# Patient Record
Sex: Female | Born: 1965
Health system: Southern US, Community
[De-identification: ages and names within clinical notes are randomized; demographics above are authoritative.]

## PROBLEM LIST (undated history)

## (undated) DIAGNOSIS — C801 Malignant (primary) neoplasm, unspecified: Secondary | ICD-10-CM

## (undated) DIAGNOSIS — F32A Depression, unspecified: Secondary | ICD-10-CM

## (undated) DIAGNOSIS — F419 Anxiety disorder, unspecified: Secondary | ICD-10-CM

## (undated) DIAGNOSIS — F329 Major depressive disorder, single episode, unspecified: Secondary | ICD-10-CM

## (undated) DIAGNOSIS — Z1379 Encounter for other screening for genetic and chromosomal anomalies: Secondary | ICD-10-CM

## (undated) HISTORY — PX: CERVICAL BIOPSY  W/ LOOP ELECTRODE EXCISION: SUR135

## (undated) HISTORY — PX: DENTAL SURGERY: SHX609

## (undated) HISTORY — PX: ABDOMINAL HYSTERECTOMY: SHX81

## (undated) HISTORY — DX: Encounter for other screening for genetic and chromosomal anomalies: Z13.79

---

## 2005-04-16 ENCOUNTER — Inpatient Hospital Stay (HOSPITAL_COMMUNITY): Admission: EM | Admit: 2005-04-16 | Discharge: 2005-04-20 | Payer: Self-pay | Admitting: *Deleted

## 2005-05-01 ENCOUNTER — Emergency Department (HOSPITAL_COMMUNITY): Admission: EM | Admit: 2005-05-01 | Discharge: 2005-05-01 | Payer: Self-pay | Admitting: Emergency Medicine

## 2015-04-05 ENCOUNTER — Emergency Department (HOSPITAL_COMMUNITY): Payer: Self-pay

## 2015-04-05 ENCOUNTER — Inpatient Hospital Stay (HOSPITAL_COMMUNITY)
Admission: EM | Admit: 2015-04-05 | Discharge: 2015-04-09 | DRG: 742 | Disposition: A | Discharge status: Home / Self Care | Payer: Self-pay | Attending: Obstetrics and Gynecology | Admitting: Obstetrics and Gynecology

## 2015-04-05 ENCOUNTER — Encounter (HOSPITAL_COMMUNITY): Payer: Self-pay | Admitting: Emergency Medicine

## 2015-04-05 DIAGNOSIS — B9789 Other viral agents as the cause of diseases classified elsewhere: Secondary | ICD-10-CM | POA: Diagnosis present

## 2015-04-05 DIAGNOSIS — N7093 Salpingitis and oophoritis, unspecified: Principal | ICD-10-CM | POA: Diagnosis present

## 2015-04-05 DIAGNOSIS — E871 Hypo-osmolality and hyponatremia: Secondary | ICD-10-CM | POA: Diagnosis present

## 2015-04-05 DIAGNOSIS — F17210 Nicotine dependence, cigarettes, uncomplicated: Secondary | ICD-10-CM | POA: Diagnosis present

## 2015-04-05 DIAGNOSIS — R1900 Intra-abdominal and pelvic swelling, mass and lump, unspecified site: Secondary | ICD-10-CM | POA: Diagnosis present

## 2015-04-05 DIAGNOSIS — F419 Anxiety disorder, unspecified: Secondary | ICD-10-CM | POA: Diagnosis present

## 2015-04-05 DIAGNOSIS — Z806 Family history of leukemia: Secondary | ICD-10-CM

## 2015-04-05 DIAGNOSIS — F329 Major depressive disorder, single episode, unspecified: Secondary | ICD-10-CM | POA: Diagnosis present

## 2015-04-05 DIAGNOSIS — R740 Nonspecific elevation of levels of transaminase and lactic acid dehydrogenase [LDH]: Secondary | ICD-10-CM | POA: Diagnosis present

## 2015-04-05 DIAGNOSIS — D259 Leiomyoma of uterus, unspecified: Secondary | ICD-10-CM | POA: Diagnosis present

## 2015-04-05 DIAGNOSIS — N7011 Chronic salpingitis: Secondary | ICD-10-CM | POA: Diagnosis present

## 2015-04-05 DIAGNOSIS — N1330 Unspecified hydronephrosis: Secondary | ICD-10-CM | POA: Diagnosis present

## 2015-04-05 HISTORY — DX: Depression, unspecified: F32.A

## 2015-04-05 HISTORY — DX: Anxiety disorder, unspecified: F41.9

## 2015-04-05 HISTORY — DX: Major depressive disorder, single episode, unspecified: F32.9

## 2015-04-05 LAB — COMPREHENSIVE METABOLIC PANEL
ALK PHOS: 139 U/L — AB (ref 38–126)
ALT: 61 U/L — ABNORMAL HIGH (ref 14–54)
ANION GAP: 13 (ref 5–15)
AST: 54 U/L — ABNORMAL HIGH (ref 15–41)
Albumin: 3 g/dL — ABNORMAL LOW (ref 3.5–5.0)
BUN: 9 mg/dL (ref 6–20)
CO2: 25 mmol/L (ref 22–32)
Calcium: 9.2 mg/dL (ref 8.9–10.3)
Chloride: 94 mmol/L — ABNORMAL LOW (ref 101–111)
Creatinine, Ser: 0.8 mg/dL (ref 0.44–1.00)
GFR calc Af Amer: >60 mL/min (ref >60)
GFR calc non Af Amer: >60 mL/min (ref >60)
GLUCOSE: 154 mg/dL — AB (ref 65–99)
Potassium: 5 mmol/L (ref 3.5–5.1)
Sodium: 132 mmol/L — ABNORMAL LOW (ref 135–145)
Total Bilirubin: 0.7 mg/dL (ref 0.3–1.2)
Total Protein: 7.8 g/dL (ref 6.5–8.1)

## 2015-04-05 LAB — URINE MICROSCOPIC-ADD ON

## 2015-04-05 LAB — URINALYSIS, ROUTINE W REFLEX MICROSCOPIC
Bilirubin Urine: NEGATIVE
Glucose, UA: NEGATIVE mg/dL
Ketones, ur: 40 mg/dL — AB
LEUKOCYTES UA: NEGATIVE
Nitrite: NEGATIVE
Protein, ur: NEGATIVE mg/dL
Specific Gravity, Urine: 1.011 (ref 1.005–1.030)
Urobilinogen, UA: 1 mg/dL (ref 0.0–1.0)
pH: 5.5 (ref 5.0–8.0)

## 2015-04-05 LAB — CBC
HEMATOCRIT: 42.7 % (ref 36.0–46.0)
HEMOGLOBIN: 15.1 g/dL — AB (ref 12.0–15.0)
MCH: 32 pg (ref 26.0–34.0)
MCHC: 35.4 g/dL (ref 30.0–36.0)
MCV: 90.5 fL (ref 78.0–100.0)
Platelets: 284 10*3/uL (ref 150–400)
RBC: 4.72 MIL/uL (ref 3.87–5.11)
RDW: 12.9 % (ref 11.5–15.5)
WBC: 12.9 10*3/uL — ABNORMAL HIGH (ref 4.0–10.5)

## 2015-04-05 LAB — WET PREP, GENITAL
Clue Cells Wet Prep HPF POC: NONE SEEN
Trich, Wet Prep: NONE SEEN
YEAST WET PREP: NONE SEEN

## 2015-04-05 LAB — LIPASE, BLOOD: Lipase: 26 U/L (ref 11–51)

## 2015-04-05 LAB — POC URINE PREG, ED: Preg Test, Ur: NEGATIVE

## 2015-04-05 LAB — I-STAT CG4 LACTIC ACID, ED: Lactic Acid, Venous: 0.67 mmol/L (ref 0.5–2.0)

## 2015-04-05 MED ORDER — SODIUM CHLORIDE 0.9 % IV BOLUS (SEPSIS)
1000.0000 mL | Freq: Once | INTRAVENOUS | Status: AC
  Administered 2015-04-05: 1000 mL via INTRAVENOUS

## 2015-04-05 MED ORDER — DEXTROSE 5 % IV SOLN
2.0000 g | Freq: Two times a day (BID) | INTRAVENOUS | Status: DC
  Administered 2015-04-05 – 2015-04-09 (×8): 2 g via INTRAVENOUS
  Filled 2015-04-05 (×10): qty 2

## 2015-04-05 MED ORDER — DEXTROSE 5 % IV SOLN
100.0000 mg | Freq: Two times a day (BID) | INTRAVENOUS | Status: DC
  Administered 2015-04-05 – 2015-04-09 (×8): 100 mg via INTRAVENOUS
  Filled 2015-04-05 (×10): qty 100

## 2015-04-05 MED ORDER — MORPHINE SULFATE (PF) 4 MG/ML IV SOLN
4.0000 mg | Freq: Once | INTRAVENOUS | Status: AC
  Administered 2015-04-05: 4 mg via INTRAVENOUS
  Filled 2015-04-05: qty 1

## 2015-04-05 MED ORDER — IOHEXOL 300 MG/ML  SOLN
80.0000 mL | Freq: Once | INTRAMUSCULAR | Status: AC | PRN
  Administered 2015-04-05: 80 mL via INTRAVENOUS

## 2015-04-05 MED ORDER — ACETAMINOPHEN 325 MG PO TABS
650.0000 mg | ORAL_TABLET | Freq: Once | ORAL | Status: AC
  Administered 2015-04-05: 650 mg via ORAL
  Filled 2015-04-05: qty 2

## 2015-04-05 NOTE — ED Notes (Signed)
Patient states has "severe abdominal pain and constipation".  Patient states she did have a bowel movement this morning.   Patient states she did have stomach bug last Thursday and went to [facility] urgent care.   Patient states has been taking ciprofloaxacin for blood in urine last week.  Patient denies blood in urine or stool.  Denies N//V.   Patient tearful at triage and states "I haven't been taking care of myself and have been drinking red bull and smoking cigarettes, but not eating very much.

## 2015-04-05 NOTE — ED Notes (Signed)
Patient transported to CT via stretcher.

## 2015-04-05 NOTE — ED Provider Notes (Signed)
CSN: [id number]     Arrival date & time 04/05/15  1416 History   First MD Initiated Contact with Patient 04/05/15 1751     Chief Complaint  Patient presents with  . Abdominal Pain     (Consider location/radiation/quality/duration/timing/severity/associated sxs/prior Treatment) HPI Comments: Patient reporting pelvic/lower abdominal pain originally onset about 2 weeks ago.  Patient has been seen at an urgent care, noted to have hematuria, and started on ciprofloxacin.  No nausea or vomiting. No vaginal discharge. Continues to have severe lower abdominal/pelvic pain.  Patient is a 49 y.o. female presenting with abdominal pain. The history is provided by the patient. No language interpreter was used.  Abdominal Pain Pain location:  Suprapubic Pain quality: aching and gnawing   Pain severity:  Moderate Onset quality:  Gradual Duration:  2 weeks Progression:  Worsening Chronicity:  New   Past Medical History  Diagnosis Date  . Anxiety and depression    History reviewed. No pertinent past surgical history. No family history on file. Social History  Substance Use Topics  . Smoking status: Former Smoker    Quit date: 04/01/2015  . Smokeless tobacco: None  . Alcohol Use: No   OB History    No data available     Review of Systems  Gastrointestinal: Positive for abdominal pain.  Genitourinary: Positive for pelvic pain.  All other systems reviewed and are negative.     Allergies  Review of patient's allergies indicates no known allergies.  Home Medications   Prior to Admission medications   Medication Sig Start Date End Date Taking? Authorizing Provider  ALPRAZolam Duanne Moron) 0.5 MG tablet Take 0.5 mg by mouth at bedtime as needed for anxiety.   Yes Historical Provider, MD  ciprofloxacin (CIPRO) 500 MG tablet Take 500 mg by mouth every 12 (twelve) hours.   Yes Historical Provider, MD   BP 93/54 mmHg  Pulse 75  Temp(Src) 98.4 F (36.9 C) (Oral)  Resp 22  Ht 5\' 5"  (1.651  m)  Wt 110 lb (49.896 kg)  BMI 18.31 kg/m2  SpO2 98% Physical Exam  Constitutional: She is oriented to person, place, and time. She appears well-developed and well-nourished.  HENT:  Head: Normocephalic.  Eyes: Pupils are equal, round, and reactive to light.  Neck: Neck supple.  Cardiovascular: Normal rate and regular rhythm.   Pulmonary/Chest: Effort normal and breath sounds normal.  Abdominal: She exhibits distension and mass. There is tenderness.    Genitourinary: There is lesion on the right labia. Right adnexum displays fullness. Left adnexum displays fullness. Vaginal discharge found.  Musculoskeletal: She exhibits no edema or tenderness.  Neurological: She is alert and oriented to person, place, and time.  Skin: Skin is warm and dry.  Psychiatric: She has a normal mood and affect.  Nursing note and vitals reviewed.   ED Course  Procedures (including critical care time) Labs Review Labs Reviewed  COMPREHENSIVE METABOLIC PANEL - Abnormal; Notable for the following:    Sodium 132 (*)    Chloride 94 (*)    Glucose, Bld 154 (*)    Albumin 3.0 (*)    AST 54 (*)    ALT 61 (*)    Alkaline Phosphatase 139 (*)    All other components within normal limits  CBC - Abnormal; Notable for the following:    WBC 12.9 (*)    Hemoglobin 15.1 (*)    All other components within normal limits  URINALYSIS, ROUTINE W REFLEX MICROSCOPIC (NOT AT [facility]) - Abnormal; Notable for  the following:    APPearance CLOUDY (*)    Hgb urine dipstick TRACE (*)    Ketones, ur 40 (*)    All other components within normal limits  URINE MICROSCOPIC-ADD ON - Abnormal; Notable for the following:    Squamous Epithelial / LPF FEW (*)    Casts WBC CAST (*)    All other components within normal limits  WET PREP, GENITAL  LIPASE, BLOOD  I-STAT CG4 LACTIC ACID, ED  POC URINE PREG, ED  GC/CHLAMYDIA PROBE AMP ([place]) NOT AT [facility]    Imaging Review Ct Abdomen Pelvis W Contrast  04/05/2015  CLINICAL  DATA:  Abdominal cramping with low abdominal pain. EXAM: CT ABDOMEN AND PELVIS WITH CONTRAST TECHNIQUE: Multidetector CT imaging of the abdomen and pelvis was performed using the standard protocol following bolus administration of intravenous contrast. CONTRAST:  41mL OMNIPAQUE IOHEXOL 300 MG/ML  SOLN COMPARISON:  None. FINDINGS: Lower chest and abdominal wall:  No contributory findings. Hepatobiliary: Lobulated 1 cm cyst in the central liver with tiny neighboring low-density likely also a cyst.No evidence of biliary obstruction or stone. Pancreas: Calcification in the pancreatic head region is near the main duct but appears parenchymal rather than ductal. Pancreas appears prominent in volume diffusely but there is no indication of acute inflammation and no pancreatic soft tissue rind. Spleen: Unremarkable. Adrenals/Urinary Tract: Negative adrenals. Mild bilateral hydronephrosis, likely due to mass effect in the pelvis. No obstructing stone. Symmetric normal renal enhancement. Unremarkable bladder. Reproductive:The uterus is enlarged by innumerable enhancing masses, distorting the endometrial cavity. A partly calcified mass sub serosal posterior uterine body measures 5 cm. There are tubular fluid-filled structures in the bilateral adnexa, contiguous with/ indistinguishable from the ovaries, consistent with hydrosalpinx or pyosalpinx. The left tube has thicker walls and more partial septations. The right adnexal collection measures at least 14 cm in length by 4 cm in diameter. The left adnexal collection also measures 4 cm in diameter with length difficult to estimate due to tortuous course. Pelvic and right pericolic gutter fluid is present without peritoneal abscess. No fluid in the hepaticrenal fossa. Stomach/Bowel:  No obstruction. No appendicitis. Vascular/Lymphatic: No acute vascular abnormality. Aortic and branch vessel atherosclerosis. Mild enlargement of periaortic lymph nodes, presumably reactive to the  adnexal findings. Peritoneal: Ascites is described above.  No pneumoperitoneum. Musculoskeletal: No acute abnormalities. IMPRESSION: 1. Large bilateral hydrosalpinx/pyosalpinx. 2. Enlarged fibroid uterus. 3. Mild bilateral hydronephrosis from #1. Electronically Signed   By: Monte Fantasia M.D.   On: 04/05/2015 21:38   I have personally reviewed and evaluated these images and lab results as part of my medical decision-making.   EKG Interpretation None     Patient discussed with and seen by Dr. Tomi Bamberger. Radiology results reviewed and shared with patient. Consult with GYN--patient will be transferred to [facility] for admission (Constant,MD).  MDM   Final diagnoses:  Pyosalpinx  Uterine leiomyoma, unspecified location        Etta Quill, NP 04/06/15 0021  Dorie Rank, MD 04/06/15 [place], MD 04/06/15 1052

## 2015-04-06 ENCOUNTER — Encounter (HOSPITAL_COMMUNITY): Payer: Self-pay | Admitting: Family Medicine

## 2015-04-06 ENCOUNTER — Ambulatory Visit (HOSPITAL_COMMUNITY)
Admit: 2015-04-06 | Discharge: 2015-04-06 | Disposition: A | Discharge status: Home / Self Care | Payer: Self-pay | Attending: Obstetrics and Gynecology | Admitting: Obstetrics and Gynecology

## 2015-04-06 ENCOUNTER — Encounter (HOSPITAL_COMMUNITY): Payer: Self-pay

## 2015-04-06 DIAGNOSIS — N7093 Salpingitis and oophoritis, unspecified: Principal | ICD-10-CM

## 2015-04-06 LAB — CBC WITH DIFFERENTIAL/PLATELET
Basophils Absolute: 0 10*3/uL (ref 0.0–0.1)
Basophils Relative: 0 %
Eosinophils Absolute: 0 10*3/uL (ref 0.0–0.7)
Eosinophils Relative: 0 %
HCT: 38.1 % (ref 36.0–46.0)
Hemoglobin: 13.3 g/dL (ref 12.0–15.0)
Lymphocytes Relative: 6 %
Lymphs Abs: 1.1 10*3/uL (ref 0.7–4.0)
MCH: 31.5 pg (ref 26.0–34.0)
MCHC: 34.9 g/dL (ref 30.0–36.0)
MCV: 90.3 fL (ref 78.0–100.0)
Monocytes Absolute: 0.4 10*3/uL (ref 0.1–1.0)
Monocytes Relative: 2 %
Neutro Abs: 17.3 10*3/uL — ABNORMAL HIGH (ref 1.7–7.7)
Neutrophils Relative %: 92 %
Platelets: 292 10*3/uL (ref 150–400)
RBC: 4.22 MIL/uL (ref 3.87–5.11)
RDW: 13.4 % (ref 11.5–15.5)
WBC: 18.9 10*3/uL — ABNORMAL HIGH (ref 4.0–10.5)

## 2015-04-06 LAB — COMPREHENSIVE METABOLIC PANEL
ALBUMIN: 2.7 g/dL — AB (ref 3.5–5.0)
ALK PHOS: 114 U/L (ref 38–126)
ALT: 44 U/L (ref 14–54)
AST: 32 U/L (ref 15–41)
Anion gap: 11 (ref 5–15)
BILIRUBIN TOTAL: 1.1 mg/dL (ref 0.3–1.2)
BUN: 10 mg/dL (ref 6–20)
CALCIUM: 8.3 mg/dL — AB (ref 8.9–10.3)
CO2: 25 mmol/L (ref 22–32)
CREATININE: 0.65 mg/dL (ref 0.44–1.00)
Chloride: 96 mmol/L — ABNORMAL LOW (ref 101–111)
GFR calc Af Amer: >60 mL/min (ref >60)
GLUCOSE: 110 mg/dL — AB (ref 65–99)
POTASSIUM: 3.7 mmol/L (ref 3.5–5.1)
Sodium: 132 mmol/L — ABNORMAL LOW (ref 135–145)
TOTAL PROTEIN: 6.8 g/dL (ref 6.5–8.1)

## 2015-04-06 LAB — GC/CHLAMYDIA PROBE AMP (~~LOC~~) NOT AT ARMC
CHLAMYDIA, DNA PROBE: NEGATIVE
Neisseria Gonorrhea: NEGATIVE

## 2015-04-06 LAB — TYPE AND SCREEN
ABO/RH(D): A POS
ANTIBODY SCREEN: NEGATIVE

## 2015-04-06 LAB — APTT: aPTT: 35 seconds (ref 24–37)

## 2015-04-06 LAB — HEPATITIS B SURFACE ANTIGEN: HEP B S AG: NEGATIVE

## 2015-04-06 LAB — RAPID HIV SCREEN (HIV 1/2 AB+AG)
HIV 1/2 Antibodies: NONREACTIVE
HIV-1 P24 Antigen - HIV24: NONREACTIVE

## 2015-04-06 LAB — IRON AND TIBC
Iron: 12 ug/dL — ABNORMAL LOW (ref 28–170)
Saturation Ratios: 6 % — ABNORMAL LOW (ref 10.4–31.8)
TIBC: 185 ug/dL — ABNORMAL LOW (ref 250–450)
UIBC: 173 ug/dL

## 2015-04-06 LAB — PROTIME-INR
INR: 1.09 (ref 0.00–1.49)
PROTHROMBIN TIME: 14.3 s (ref 11.6–15.2)

## 2015-04-06 LAB — ABO/RH: ABO/RH(D): A POS

## 2015-04-06 MED ORDER — OXYCODONE-ACETAMINOPHEN 5-325 MG PO TABS
1.0000 | ORAL_TABLET | ORAL | Status: DC | PRN
  Administered 2015-04-06 (×3): 1 via ORAL
  Administered 2015-04-06 – 2015-04-07 (×2): 2 via ORAL
  Administered 2015-04-07 (×2): 1 via ORAL
  Administered 2015-04-08: 2 via ORAL
  Administered 2015-04-08 – 2015-04-09 (×3): 1 via ORAL
  Filled 2015-04-06 (×4): qty 1
  Filled 2015-04-06: qty 2
  Filled 2015-04-06 (×2): qty 1
  Filled 2015-04-06: qty 2
  Filled 2015-04-06 (×3): qty 1
  Filled 2015-04-06: qty 2

## 2015-04-06 MED ORDER — FENTANYL CITRATE (PF) 100 MCG/2ML IJ SOLN
INTRAMUSCULAR | Status: AC | PRN
  Administered 2015-04-06 (×2): 25 ug via INTRAVENOUS

## 2015-04-06 MED ORDER — IBUPROFEN 600 MG PO TABS
600.0000 mg | ORAL_TABLET | Freq: Four times a day (QID) | ORAL | Status: DC | PRN
  Administered 2015-04-06 – 2015-04-08 (×6): 600 mg via ORAL
  Filled 2015-04-06 (×6): qty 1

## 2015-04-06 MED ORDER — DEXTROSE 5 % IV SOLN: 100.0000 mg | Freq: Two times a day (BID) | INTRAVENOUS | Status: DC

## 2015-04-06 MED ORDER — HYDROMORPHONE HCL 1 MG/ML IJ SOLN
1.0000 mg | Freq: Once | INTRAMUSCULAR | Status: AC
  Administered 2015-04-06: 1 mg via INTRAVENOUS
  Filled 2015-04-06: qty 1

## 2015-04-06 MED ORDER — ONDANSETRON HCL 4 MG/2ML IJ SOLN
4.0000 mg | Freq: Once | INTRAMUSCULAR | Status: AC
  Administered 2015-04-06: 4 mg via INTRAVENOUS
  Filled 2015-04-06: qty 2

## 2015-04-06 MED ORDER — MORPHINE SULFATE (PF) 4 MG/ML IV SOLN
4.0000 mg | Freq: Once | INTRAVENOUS | Status: AC
  Administered 2015-04-06: 4 mg via INTRAVENOUS
  Filled 2015-04-06: qty 1

## 2015-04-06 MED ORDER — MIDAZOLAM HCL 2 MG/2ML IJ SOLN
INTRAMUSCULAR | Status: AC
  Filled 2015-04-06: qty 4

## 2015-04-06 MED ORDER — PRENATAL MULTIVITAMIN CH
1.0000 | ORAL_TABLET | Freq: Every day | ORAL | Status: DC
  Administered 2015-04-07 – 2015-04-09 (×3): 1 via ORAL
  Filled 2015-04-06 (×3): qty 1

## 2015-04-06 MED ORDER — LACTATED RINGERS IV SOLN
INTRAVENOUS | Status: DC
  Administered 2015-04-06 – 2015-04-09 (×7): via INTRAVENOUS

## 2015-04-06 MED ORDER — MIDAZOLAM HCL 2 MG/2ML IJ SOLN
INTRAMUSCULAR | Status: AC | PRN
Start: 2015-04-06 — End: 2015-04-06
  Administered 2015-04-06: 1 mg via INTRAVENOUS
  Administered 2015-04-06: 0.5 mg via INTRAVENOUS

## 2015-04-06 MED ORDER — INFLUENZA VAC SPLIT QUAD 0.5 ML IM SUSY
0.5000 mL | PREFILLED_SYRINGE | INTRAMUSCULAR | Status: AC
  Administered 2015-04-07: 0.5 mL via INTRAMUSCULAR

## 2015-04-06 MED ORDER — FENTANYL CITRATE (PF) 100 MCG/2ML IJ SOLN
INTRAMUSCULAR | Status: AC
  Filled 2015-04-06: qty 2

## 2015-04-06 MED ORDER — DEXTROSE 5 % IV SOLN: 2.0000 g | Freq: Two times a day (BID) | INTRAVENOUS | Status: DC

## 2015-04-06 NOTE — Procedures (Signed)
Interventional Radiology Procedure Note  Procedure: CT guided drainage of bilateral tubo-ovarian abscess  2 right transgluteal drain placed.   1 into the right salpinx 1 crosses the pelvis into the left salpinx  To gravity drain.  Complications: None Recommendations:  - To gravity drainage. - If drain clinic needed, call [phone].  Would not schedule repeat imaging less before 1 week as long as no change.   - Routine drain care   Signed,  Dulcy Fanny. Earleen Newport, DO

## 2015-04-06 NOTE — ED Notes (Signed)
Patient transported via [facility] to [facility].

## 2015-04-06 NOTE — H&P (Signed)
Chief Complaint: Patient was seen in consultation today for tubo-ovarian abscesses at the request of Constant,Peggy  Referring Physician(s): Constant,Peggy  History of Present Illness: Kerry Morris is a 49 y.o. female with complaints of weakness and abdominal pain x 1 week, recent fevers, diarrhea and vomiting. Labs reveal leukocytosis and imaging revealed large bilateral hydrosalpinx/pyosalpinx concerning for abscesses. IR received request for image guided pelvic drain (s) placement. She admits to ongoing pelvic pain. She denies any chest pain, shortness of breath or palpitations. She denies any active signs of bleeding or excessive bruising. The patient denies any history of sleep apnea or chronic oxygen use. She has previously tolerated sedation without complications.    Past Medical History  Diagnosis Date  . Anxiety and depression     Past Surgical History  Procedure Laterality Date  . Dental surgery    . Cervical biopsy  w/ loop electrode excision      Allergies: Review of patient's allergies indicates no known allergies.  Medications: Prior to Admission medications   Medication Sig Start Date End Date Taking? Authorizing Provider  ALPRAZolam Duanne Moron) 0.5 MG tablet Take 0.5 mg by mouth at bedtime as needed for anxiety.    Historical Provider, MD  ciprofloxacin (CIPRO) 500 MG tablet Take 500 mg by mouth every 12 (twelve) hours.    Historical Provider, MD     Family History  Problem Relation Age of Onset  . Leukemia Mother     Social History   Social History  . Marital Status: Unknown    Spouse Name: N/A  . Number of Children: N/A  . Years of Education: N/A   Occupational History  . 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 Smoking status: Current Every Day Smoker -- 1.00 packs/day for 25 years    Types: Cigarettes  . Smokeless tobacco: None  . Alcohol Use: No     Comment: Previous h/o alcoholism, sober x 8 years  . Drug Use: Yes   Special: Marijuana     Comment: remote h/o IVDU, has been worked up for parenterally transmitted infections multiple times since  . Sexual Activity:    Partners: Male   Other Topics Concern  . None   Social History Narrative   Lives with husband who is also her business partner. They own and run a bar/club. Recent financial difficulties have been a significant source of stress for them. In addition, patient is grieving the loss of a close family member/friend in Oct 2016. As a result, Kerry Morris reports poor diet for the past several months with notable unintentional weight loss.    Review of Systems: A 12 point ROS discussed and pertinent positives are indicated in the HPI above.  All other systems are negative.  Review of Systems  Vital Signs: T: 97.57F, HR: 60 bpm, BP: 119/68 mmHg, O2: 98%RA  Physical Exam  Constitutional: She is oriented to person, place, and time. No distress.  HENT:  Head: Normocephalic and atraumatic.  Cardiovascular: Normal rate and regular rhythm.  Exam reveals no gallop and no friction rub.   No murmur heard. Pulmonary/Chest: Effort normal and breath sounds normal. No respiratory distress. She has no wheezes. She has no rales.  Abdominal: Soft. There is tenderness.  Neurological: She is alert and oriented to person, place, and time.  Skin: She is not diaphoretic.    Mallampati Score:  MD Evaluation Airway: WNL Heart: WNL Abdomen: WNL Chest/ Lungs: WNL ASA  Classification: 3  Mallampati/Airway Score: Two  Imaging: Ct Abdomen Pelvis W Contrast  04/05/2015  CLINICAL DATA:  Abdominal cramping with low abdominal pain. EXAM: CT ABDOMEN AND PELVIS WITH CONTRAST TECHNIQUE: Multidetector CT imaging of the abdomen and pelvis was performed using the standard protocol following bolus administration of intravenous contrast. CONTRAST:  50mL OMNIPAQUE IOHEXOL 300 MG/ML  SOLN COMPARISON:  None. FINDINGS: Lower chest and abdominal wall:  No contributory findings.  Hepatobiliary: Lobulated 1 cm cyst in the central liver with tiny neighboring low-density likely also a cyst.No evidence of biliary obstruction or stone. Pancreas: Calcification in the pancreatic head region is near the main duct but appears parenchymal rather than ductal. Pancreas appears prominent in volume diffusely but there is no indication of acute inflammation and no pancreatic soft tissue rind. Spleen: Unremarkable. Adrenals/Urinary Tract: Negative adrenals. Mild bilateral hydronephrosis, likely due to mass effect in the pelvis. No obstructing stone. Symmetric normal renal enhancement. Unremarkable bladder. Reproductive:The uterus is enlarged by innumerable enhancing masses, distorting the endometrial cavity. A partly calcified mass sub serosal posterior uterine body measures 5 cm. There are tubular fluid-filled structures in the bilateral adnexa, contiguous with/ indistinguishable from the ovaries, consistent with hydrosalpinx or pyosalpinx. The left tube has thicker walls and more partial septations. The right adnexal collection measures at least 14 cm in length by 4 cm in diameter. The left adnexal collection also measures 4 cm in diameter with length difficult to estimate due to tortuous course. Pelvic and right pericolic gutter fluid is present without peritoneal abscess. No fluid in the hepaticrenal fossa. Stomach/Bowel:  No obstruction. No appendicitis. Vascular/Lymphatic: No acute vascular abnormality. Aortic and branch vessel atherosclerosis. Mild enlargement of periaortic lymph nodes, presumably reactive to the adnexal findings. Peritoneal: Ascites is described above.  No pneumoperitoneum. Musculoskeletal: No acute abnormalities. IMPRESSION: 1. Large bilateral hydrosalpinx/pyosalpinx. 2. Enlarged fibroid uterus. 3. Mild bilateral hydronephrosis from #1. Electronically Signed   By: Monte Fantasia M.D.   On: 04/05/2015 21:38    Labs:  CBC:  Recent Labs  04/05/15 1451 04/06/15 0540  WBC  12.9* 18.9*  HGB 15.1* 13.3  HCT 42.7 38.1  PLT 284 292    COAGS:  Recent Labs  04/06/15 1005  INR 1.09  APTT 35    BMP:  Recent Labs  04/05/15 1451 04/06/15 0540  NA 132* 132*  K 5.0 3.7  CL 94* 96*  CO2 25 25  GLUCOSE 154* 110*  BUN 9 10  CALCIUM 9.2 8.3*  CREATININE 0.80 0.65  GFRNONAA >60 >60  GFRAA >60 >60    LIVER FUNCTION TESTS:  Recent Labs  04/05/15 1451 04/06/15 0540  BILITOT 0.7 1.1  AST 54* 32  ALT 61* 44  ALKPHOS 139* 114  PROT 7.8 6.8  ALBUMIN 3.0* 2.7*    Assessment and Plan: Abdominal pain x 1 week Fevers Vomiting  Imaging revealed large bilateral hydrosalpinx/pyosalpinx with leukocytosis  Request for image guided pelvic drain (s) placement with sedation The patient has been NPO, no blood thinners taken, labs and vitals have been reviewed. Risks and Benefits discussed with the patient including bleeding, infection, damage to adjacent structures, bowel perforation/fistula connection, ovarian failure and sepsis. All of the patient's questions were answered, patient is agreeable to proceed. Consent signed and in chart.   Thank you for this interesting consult.  I greatly enjoyed meeting Kerry Morris and look forward to participating in their care.  A copy of this report was sent to the requesting provider on this date.  Signed: Hedy Jacob  04/06/2015, 3:22 PM   I spent a total of 20 Minutes in face to face in clinical consultation, greater than 50% of which was counseling/coordinating care for tubo-ovarian abscess.

## 2015-04-06 NOTE — Progress Notes (Signed)
   04/06/15 1311  Medical Necessity for Transport Certificate --- IF THIS TRANSPORT IS ROUND TRIP OR SCHEDULED AND REPEATED, A PHYSICIAN MUST COMPLETE THIS FORM  Transport to (Location) [facility]  Reason for Transport Procedure  Name of [place]  Round Trip Transport? Yes  Q1 Are ALL the following "true" for this patient?              1. Unable to get up from bed without assistance  AND            2. Unable to ambulate AND        3. Unable to sit in a chair, including a wheelchair. No  Q2 Could the patient be transported safely by other means of transportation (I.E., wheelchair van)? No  Reason for transport - patient condition Requires IV maintenance  Q3 Reason based on Facility and/or [employer] not available at original facility  Specific Services Other (Comment) (interventional radiology)  Electronic Signature and Credentials (can be: Physician, RN, NP, PA, DP, CNS) Kennith Center, RN

## 2015-04-06 NOTE — ED Notes (Signed)
Patient to be transferred to [id number] - via [facility].

## 2015-04-06 NOTE — Progress Notes (Signed)
Kerry Morris filled out an Advance Directive that she wished to sign before her procedure at [facility].  Given time constraints, Spiritual Care at [facility] was notified to attempt to assist her there.    Also consulted with her nurse, Kennith Center and LCSW, Lucita Ferrara about her care.  When she returns to [facility] from her procedure, we will consult again to see if Spiritual care support is needed.  [place], [place] Pager, [phone] 4:05 PM    04/06/15 1600  Clinical Encounter Type  Visited With Patient  Visit Type (Advance Directive)  Referral From Nurse;Social work

## 2015-04-06 NOTE — Progress Notes (Addendum)
CSW acknowledges consult for crisis counseling.  CSW consulted with patient's RN, who reported that patient will transport to another facility for a procedure, and will then return to [place] informed CSW that patient will have an ongoing admission, and CSW is able to follow up on 11/10.  CSW also consulted with Janne Napoleon, Chaplain, who shared impressions that patient may be presenting with depression.  CSW to meet with patient on 11/10.

## 2015-04-06 NOTE — Progress Notes (Signed)
   04/06/15 1646  Clinical Encounter Type  Visited With Patient and family together  Visit Type Other (Comment) (Advance Directive)  Referral From Chaplain  Consult/Referral To [place] (For Healthcare)  Does patient have an advance directive? Yes  Would patient like information on creating an advanced directive? No - patient declined information  Type of Paramedic of [place];Living will  Does patient want to make changes to advanced directive? No - Patient declined  Copy of advanced directive(s) in chart? Yes (Copy in paper chart and with medical records to scan to [facility])   Following referral from [facility].  [facility] Chaplain notarized Advance Directive packet prior to pt procedure.  Copy placed in paper chart with Interventional Radiology.  Copy in medical records to be scanned into [facility].   [place], [place]

## 2015-04-06 NOTE — H&P (Signed)
Kerry Morris is an 49 y.o. nulliparous female with no significant PMH who is transferred to [facility] for direct admission from [facility] for bilateral tubo-ovarian abscesses. Kerry Morris was in her usual state of health until 8 days prior to admission when she developed malaise, abdominal pain, vomiting, and diarrhea. She and her husband were visiting family in [place] at the time. The following day she continued to be unwell and reports a measured fever to 102. She did not have any sick contacts. At that time she believed her symptoms to be due to a flu-like illness and managed them expectantly with OTC remedies. 6 days ago her vomiting and diarrhea subsided, however she continued to have severe generalized abdominal pain, flatus, and belching. Upon return home to [place] 5 days ago, she visited urgent care due to her continued abdominal pain, malaise, and fevers. She reports that a UA was collected at that time which showed microscopic hematuria without bacteria. She was prescribed a course of Cipro 500mg  BID, which she began the immediately. She continued to have abdominal pain throughout the weekend but waited to see if the antibiotics improved her symptoms. However, she has continued to have increasingly severe abdominal pain prompting her to come to the ED.  [name] reports 10+ abdominal pain primary in the lower abdomen but with bilateral radiation to the flanks and the low back. She describes the pain as Kerry Morris, sharp, aching, and cramping in nature with progressive worsening over the past several days. She has not continued to have diarrhea or vomiting since the onset of her symptoms, however she endorses loss of appetite, severe malaise, and continued fevers which she has been managing with OTC antipyretics.   In the ED she was found to be afebrile, with significant abdominal tenderness in the lower quadrants bilaterally and with adnexal fullness bilaterally on bimanual exam. Wet prep and UA  were both significant only for WBCs. Laboratory workup demonstrated for mild leukocytosis, mild transaminitis, and hyponatremia. A CT abdomen was performed and showed significant bilateral pyosalpinx. GYN was consulted and the patient was subsequently transferred to [facility] for further evaluation and treatment.  Pertinent negatives on ROS include no vaginal discharge, no vaginal pruritis or pain, no dysuria, no hematuria. See below for complete ROS.  Pertinent Gynecological History: Menses: peri-menopausal: LMP in May 2016 Bleeding: none Contraception: none Sexually transmitted diseases: past history: GC/CT x 2 Previous GYN Procedures: LEEP  OB History: G0P0  Past Medical History  Diagnosis Date  . Anxiety and depression    Past Surgical History  Procedure Laterality Date  . Dental surgery    . Cervical biopsy  w/ loop electrode excision     Family History  Problem Relation Age of Onset  . Leukemia Mother    Social History   Social History  . Marital Status: Unknown    Spouse Name: N/A  . Number of Children: N/A  . Years of Education: N/A   Occupational History  . 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 Smoking status: Current Every Day Smoker -- 1.00 packs/day for 25 years    Types: Cigarettes  . Smokeless tobacco: Not on file  . Alcohol Use: No     Comment: Previous h/o alcoholism, sober x 8 years  . Drug Use: Yes    Special: Marijuana     Comment: remote h/o IVDU, has been worked up for parenterally transmitted infections multiple times since  . Sexual Activity:  Partners: Male   Other Topics Concern  . Not on file   Social History Narrative   Lives with husband who is also her business partner. They own and run a bar/club. Recent financial difficulties have been a significant source of stress for them. In addition, patient is grieving the loss of a close family member/friend in Oct 2016. As a result, Jamarie reports poor diet for  the past several months with notable unintentional weight loss.   Allergies: No Known Allergies  Prescriptions prior to admission  Medication Sig Dispense Refill Last Dose  . ALPRAZolam (XANAX) 0.5 MG tablet Take 0.5 mg by mouth at bedtime as needed for anxiety.   04/05/2015 at Unknown time  . ciprofloxacin (CIPRO) 500 MG tablet Take 500 mg by mouth every 12 (twelve) hours.   04/05/2015 at Unknown time   Review of Systems  Constitutional: Positive for fever, chills, malaise/fatigue and diaphoresis.  Eyes: Negative for blurred vision and double vision.  Respiratory: Negative for cough and shortness of breath.   Cardiovascular: Negative for chest pain.  Gastrointestinal: Positive for nausea, vomiting, abdominal pain and diarrhea.  Genitourinary: Positive for flank pain. Negative for dysuria and hematuria.  Musculoskeletal: Positive for back pain.  Skin: Negative for rash.  Neurological: Negative for tingling, sensory change and headaches.  Psychiatric/Behavioral: Negative for suicidal ideas.    Physical Exam  Filed Vitals:   04/06/15 0208  BP: 120/68  Pulse: 96  Temp: 99.6 F (37.6 C)  Resp: 18  GEN: Thin, alert, uncomfortable-appearing woman resting in bed. PULM: CTAB  CV: RRR, S1 and S2 heard, no M/R/G appreciated ABD: Decreased BS. Flat, rigid. Abdomen TTP in lower quadrants bilaterally and suprapubic region.. No epigastric tenderness. Significant involuntary guarding, ?rebound - difficult to assess d/t level of pain and abdominal rigidity GU: Two R labial nodules noted which patient states she has had for years and are unchanged, has previously been told they are benign. Otherwise normal-appearing female genitalia. Vaginal and cervical mucosa without lesions or erythema. Moderate mucopurulent discharge in the vaginal vault. Bilateral adnexal tenderness and fullness on bimanual exam, no significant cervical motion tenderness. EXTR: No LE edema or calf tenderness.  Lipase, blood      Status: None   Collection Time: 04/05/15  2:51 PM  Result Value Ref Range   Lipase 26 11 - 51 U/L  Comprehensive metabolic panel     Status: Abnormal   Collection Time: 04/05/15  2:51 PM  Result Value Ref Range   Sodium 132 (L) 135 - 145 mmol/L   Potassium 5.0 3.5 - 5.1 mmol/L   Chloride 94 (L) 101 - 111 mmol/L   CO2 25 22 - 32 mmol/L   Glucose, Bld 154 (H) 65 - 99 mg/dL   BUN 9 6 - 20 mg/dL   Creatinine, Ser 0.80 0.44 - 1.00 mg/dL   Calcium 9.2 8.9 - 10.3 mg/dL   Total Protein 7.8 6.5 - 8.1 g/dL   Albumin 3.0 (L) 3.5 - 5.0 g/dL   AST 54 (H) 15 - 41 U/L   ALT 61 (H) 14 - 54 U/L   Alkaline Phosphatase 139 (H) 38 - 126 U/L   Total Bilirubin 0.7 0.3 - 1.2 mg/dL   GFR calc non Af Amer >60 >60 mL/min   GFR calc Af Amer >60 >60 mL/min   Anion gap 13 5 - 15  CBC     Status: Abnormal   Collection Time: 04/05/15  2:51 PM  Result Value Ref Range  WBC 12.9 (H) 4.0 - 10.5 K/uL   RBC 4.72 3.87 - 5.11 MIL/uL   Hemoglobin 15.1 (H) 12.0 - 15.0 g/dL   HCT 42.7 36.0 - 46.0 %   MCV 90.5 78.0 - 100.0 fL   MCH 32.0 26.0 - 34.0 pg   MCHC 35.4 30.0 - 36.0 g/dL   RDW 12.9 11.5 - 15.5 %   Platelets 284 150 - 400 K/uL  I-Stat CG4 Lactic Acid, ED     Status: None   Collection Time: 04/05/15  6:52 PM  Result Value Ref Range   Lactic Acid, Venous 0.67 0.5 - 2.0 mmol/L  POC urine preg, ED (not at [facility])     Status: None   Collection Time: 04/05/15  8:01 PM  Urinalysis, Routine w reflex microscopic (not at [facility])     Status: Abnormal   Collection Time: 04/05/15  8:12 PM  Result Value Ref Range   Color, Urine YELLOW YELLOW   APPearance CLOUDY (A) CLEAR   Specific Gravity, Urine 1.011 1.005 - 1.030   pH 5.5 5.0 - 8.0   Glucose, UA NEGATIVE NEGATIVE mg/dL   Hgb urine dipstick TRACE (A) NEGATIVE   Bilirubin Urine NEGATIVE NEGATIVE   Ketones, ur 40 (A) NEGATIVE mg/dL   Protein, ur NEGATIVE NEGATIVE mg/dL   Urobilinogen, UA 1.0 0.0 - 1.0 mg/dL   Nitrite NEGATIVE NEGATIVE   Leukocytes, UA NEGATIVE  NEGATIVE  Urine microscopic-add on     Status: Abnormal   Collection Time: 04/05/15  8:12 PM  Result Value Ref Range   Squamous Epithelial / LPF FEW (A) RARE   WBC, UA 0-2 <3 WBC/hpf   RBC / HPF 0-2 <3 RBC/hpf   Casts WBC CAST (A) NEGATIVE   Urine-Other MUCOUS PRESENT   Wet prep, genital     Status: Abnormal   Collection Time: 04/05/15 11:03 PM  Result Value Ref Range   Yeast Wet Prep HPF POC NONE SEEN NONE SEEN   Trich, Wet Prep NONE SEEN NONE SEEN   Clue Cells Wet Prep HPF POC NONE SEEN NONE SEEN   WBC, Wet Prep HPF POC TOO NUMEROUS TO COUNT (A) NONE SEEN   04/05/2015 EXAM: CT ABDOMEN AND PELVIS WITH CONTRAST IMPRESSION:  1. Large bilateral hydrosalpinx/pyosalpinx.  2. Enlarged fibroid uterus.  3. Mild bilateral hydronephrosis from #1.   Assessment/Plan: Yesha Muchow is a 49 y.o. nulliparous woman with a remote history of GC/CT x 2 and no current high-risk sexual activity who presents with an 8-day history of worsening abdominal pain and fevers, found on CT to have significant bilateral pyosalpinx.  1. Bilateral tubo-ovarian abscess: CT most suggestive of TOA. This is consistent with her presentation of worsening lower abdominal pain, fevers, and nausea/vomiting. Interestingly she has had no reported s/sx of vaginal or urinary tract infection and no history of high-risk sexual behavior. Most likely polymicrobial, will continue with broad-spectrum IV Abx. Abdominal exam is concerning for peritoneal signs. Will consider IR consult in AM for possible drainage of pyosalpinx vs other operative intervention vs medical management with IV antimicrobials alone. -- IV Doxycyline -- IV Cefotetan -- GC/CT and RPR pending -- Urine cx pending -- AM CBC -- Ibuprofen 600 mg q6 hrs ATC -- Percocet q3 hrs PRN -- Will consider IR consult in AM  2. Hyponatremia: Most likely hypovolemic hyponatremia secondary to poor PO intake and history of GI losses. Resolution expected with appropriate fluid  resuscitation. -- s/p 1L NS bolus in ED -- AM chemistries -- IVF with LR  as below  3. Transaminitis: Unclear clinical significance. Patient with history of EtOH abuse and remote history of IVDU with reported negative testing for parenterally transmitted diseases. Hepatic nodule identified on CT scan. Ddx also includes reactive inflammation d/t intra-abdominal infection. -- Trend LFTs -- Hep B & C testing ordered -- HIV testing ordered  4. Hydronephrosis: Identified on CT scan, most likely secondary to pelvic mass effect as described in CT report. -- AM CMP to follow renal function  FEN: -- IVF: LR @ 100 mL/hr -- Diet: NPO in case of IR drainage  PPx: Low risk for DVT, SCD stockings in place.  Code Status: FULL Dispo: Requires inpatient treatment including IV antibiotics and possible procedural or surgical intervention for complicated pelvic infection. Anticipate 3-5 days of admission at least.  Keane Scrape, MD 04/06/2015 5:07 AM   This case was discussed with Dr. Reesa Chew (interventional radiology) who states that the patient is a candidate for aspiration and possible drain placement. Will coordinate transfer of the patient today. Will keep NPO for now. Patient was made aware of plan of care.

## 2015-04-06 NOTE — Progress Notes (Signed)
Patient ID: Kerry Morris, female   DOB: [birth year], 49 y.o.   MRN: [id number]   IR aware of request for tubo ovarian abscess asp/drain placement  Dr Annamaria Boots has approved procedure  RN will hear from IR PA as to plan asap.Marland KitchenMarland KitchenMarland Kitchen

## 2015-04-07 ENCOUNTER — Encounter (HOSPITAL_COMMUNITY): Payer: Self-pay | Admitting: Anesthesiology

## 2015-04-07 DIAGNOSIS — R1011 Right upper quadrant pain: Secondary | ICD-10-CM

## 2015-04-07 LAB — URINE CULTURE: CULTURE: NO GROWTH

## 2015-04-07 LAB — COMPREHENSIVE METABOLIC PANEL
ALK PHOS: 91 U/L (ref 38–126)
ALT: 27 U/L (ref 14–54)
AST: 23 U/L (ref 15–41)
Albumin: 2.3 g/dL — ABNORMAL LOW (ref 3.5–5.0)
Anion gap: 7 (ref 5–15)
BILIRUBIN TOTAL: 0.5 mg/dL (ref 0.3–1.2)
BUN: 12 mg/dL (ref 6–20)
CALCIUM: 8.3 mg/dL — AB (ref 8.9–10.3)
CO2: 29 mmol/L (ref 22–32)
CREATININE: 0.6 mg/dL (ref 0.44–1.00)
Chloride: 98 mmol/L — ABNORMAL LOW (ref 101–111)
GFR calc Af Amer: >60 mL/min (ref >60)
Glucose, Bld: 118 mg/dL — ABNORMAL HIGH (ref 65–99)
POTASSIUM: 3.4 mmol/L — AB (ref 3.5–5.1)
Sodium: 134 mmol/L — ABNORMAL LOW (ref 135–145)
TOTAL PROTEIN: 6.3 g/dL — AB (ref 6.5–8.1)

## 2015-04-07 LAB — RPR: RPR: NONREACTIVE

## 2015-04-07 LAB — CBC WITH DIFFERENTIAL/PLATELET
BASOS ABS: 0 10*3/uL (ref 0.0–0.1)
Basophils Relative: 0 %
Eosinophils Absolute: 0 10*3/uL (ref 0.0–0.7)
Eosinophils Relative: 0 %
HEMATOCRIT: 35.5 % — AB (ref 36.0–46.0)
Hemoglobin: 12.3 g/dL (ref 12.0–15.0)
LYMPHS PCT: 7 %
Lymphs Abs: 1.3 10*3/uL (ref 0.7–4.0)
MCH: 31.4 pg (ref 26.0–34.0)
MCHC: 34.6 g/dL (ref 30.0–36.0)
MCV: 90.6 fL (ref 78.0–100.0)
MONO ABS: 0.3 10*3/uL (ref 0.1–1.0)
MONOS PCT: 1 %
NEUTROS ABS: 16.9 10*3/uL — AB (ref 1.7–7.7)
Neutrophils Relative %: 92 %
Platelets: 283 10*3/uL (ref 150–400)
RBC: 3.92 MIL/uL (ref 3.87–5.11)
RDW: 13.5 % (ref 11.5–15.5)
WBC: 18.5 10*3/uL — ABNORMAL HIGH (ref 4.0–10.5)

## 2015-04-07 LAB — HEPATITIS C ANTIBODY: HCV Ab: 6 s/co ratio — ABNORMAL HIGH (ref 0.0–0.9)

## 2015-04-07 LAB — PREALBUMIN: Prealbumin: 3.3 mg/dL — ABNORMAL LOW (ref 18–38)

## 2015-04-07 MED ORDER — FAMOTIDINE 20 MG PO TABS
40.0000 mg | ORAL_TABLET | Freq: Two times a day (BID) | ORAL | Status: DC
  Administered 2015-04-07 – 2015-04-09 (×5): 40 mg via ORAL
  Filled 2015-04-07 (×5): qty 2

## 2015-04-07 NOTE — Progress Notes (Signed)
Patient ID: Kerry Morris, female   DOB: [birth year], 49 y.o.   MRN: [id number]   Subjective: Interval History:s/p IR drainage of Bilateral pyosalpinx.  Reports pain is improved. But she is not very mobile yet. Drains are continuing to drain. One culture has Gram positive Bacilli in it. She is now complaining of RUQ pain, after breakfast. Denies nausea or vomiting.  She thinks she might have laid down too soon after breakfast.  Had bacon and buttered toast.  Objective: Vital signs in last 24 hours: Temp:  [97.3 F (36.3 C)-98.2 F (36.8 C)] 97.5 F (36.4 C) (11/10 0533) Pulse Rate:  [60-74] 67 (11/10 0533) Resp:  [12-23] 16 (11/10 0533) BP: (92-128)/(44-68) 98/54 mmHg (11/10 0533) SpO2:  [98 %-100 %] 99 % (11/10 0533)  Intake/Output from previous day: 11/09 0701 - 11/10 0700 In: 3216.7 [I.V.:2546.7] Out: 753 [Urine:550; Drains:203] Intake/Output this shift:   General appearance: alert, cooperative and appears stated age Neck: supple, symmetrical, trachea midline Lungs: normal effort Heart: regular rate and rhythm Abdomen: soft, TTP in lower quadrants bilaterally, no rebound, TTP in RUQ at costal margin. Skin: Skin color, texture, turgor normal. No rashes or lesions Neurologic: Grossly normal  Results for orders placed or performed during the hospital encounter of 04/05/15 (from the past 24 hour(s))  CBC with Differential/Platelet     Status: Abnormal   Collection Time: 04/07/15  5:20 AM  Result Value Ref Range   WBC 18.5 (H) 4.0 - 10.5 K/uL   RBC 3.92 3.87 - 5.11 MIL/uL   Hemoglobin 12.3 12.0 - 15.0 g/dL   HCT 35.5 (L) 36.0 - 46.0 %   MCV 90.6 78.0 - 100.0 fL   MCH 31.4 26.0 - 34.0 pg   MCHC 34.6 30.0 - 36.0 g/dL   RDW 13.5 11.5 - 15.5 %   Platelets 283 150 - 400 K/uL   Neutrophils Relative % 92 %   Neutro Abs 16.9 (H) 1.7 - 7.7 K/uL   Lymphocytes Relative 7 %   Lymphs Abs 1.3 0.7 - 4.0 K/uL   Monocytes Relative 1 %   Monocytes Absolute 0.3 0.1 - 1.0 K/uL   Eosinophils  Relative 0 %   Eosinophils Absolute 0.0 0.0 - 0.7 K/uL   Basophils Relative 0 %   Basophils Absolute 0.0 0.0 - 0.1 K/uL  Comprehensive metabolic panel     Status: Abnormal   Collection Time: 04/07/15  5:20 AM  Result Value Ref Range   Sodium 134 (L) 135 - 145 mmol/L   Potassium 3.4 (L) 3.5 - 5.1 mmol/L   Chloride 98 (L) 101 - 111 mmol/L   CO2 29 22 - 32 mmol/L   Glucose, Bld 118 (H) 65 - 99 mg/dL   BUN 12 6 - 20 mg/dL   Creatinine, Ser 0.60 0.44 - 1.00 mg/dL   Calcium 8.3 (L) 8.9 - 10.3 mg/dL   Total Protein 6.3 (L) 6.5 - 8.1 g/dL   Albumin 2.3 (L) 3.5 - 5.0 g/dL   AST 23 15 - 41 U/L   ALT 27 14 - 54 U/L   Alkaline Phosphatase 91 38 - 126 U/L   Total Bilirubin 0.5 0.3 - 1.2 mg/dL   GFR calc non Af Amer >60 >60 mL/min   GFR calc Af Amer >60 >60 mL/min   Anion gap 7 5 - 15   Specimen Description ABSCESS LEFT PELVIS TUBO OVARIAN   Special Requests NONE   Gram Stain ABUNDANT WBC PRESENT, PREDOMINANTLY PMN  NO SQUAMOUS EPITHELIAL CELLS SEEN  FEW GRAM POSITIVE RODS             Scheduled Meds: . cefoTEtan (CEFOTAN) IV  2 g Intravenous Q12H  . doxycycline (VIBRAMYCIN) IV  100 mg Intravenous Q12H  . famotidine  40 mg Oral BID  . prenatal multivitamin  1 tablet Oral Q1200   Continuous Infusions: . lactated ringers 100 mL/hr at 04/06/15 2213   PRN Meds:ibuprofen, oxyCODONE-acetaminophen  Assessment/Plan: Patient Active Problem List   Diagnosis Date Noted  . TOA (tubo-ovarian abscess) 04/06/2015  Improving - discussed with pharmacy and we are making sure our antibiotic coverage is still correct based on new findings on gram stain results - note culture has no growth as yet.  RUQ pain - possibly GERD vs. cholelithiasis--trial of Pepcid. Avoid high fat foods--if no improvement consider RUQ [place]   LOS: 1 day   PRATT,TANYA S, MD 04/07/2015 11:13 AM

## 2015-04-07 NOTE — Plan of Care (Signed)
Problem: Tissue Perfusion: Goal: Risk factors for ineffective tissue perfusion will decrease Outcome: Progressing SCDs on and ambulation encouraged.

## 2015-04-07 NOTE — Progress Notes (Signed)
Followed up with patient upon referral from [facility].  Pt shared that she was "doing good.  Just trying to get some rest and take it all in."  Pt stated that she did not want to talk at this time, but would call to request a chaplain if she changed her mind.  She confirmed that she just asks her nurse to speak with the chaplain if she decides she wants to talk.  Chaplain reminded her that we are available at all times.  Please page as further needs arise.  Donald Prose. Elyn Peers, M.Div. [facility] Chaplain Pager [phone] Office [phone]      04/07/15 1124  Clinical Encounter Type  Visited With Family  Visit Type Follow-up  Referral From Chaplain  Consult/Referral To Chaplain

## 2015-04-07 NOTE — Progress Notes (Signed)
Patient ID: Kerry Morris, female   DOB: [birth year], 50 y.o.   MRN: [id number]   Rt Transgluteal abscess drain x 2 placed 11/9 RN says pt resting this am No complaints Output 20 cc #1 (superior) and 23 cc #2 (inferior)  Wbc 18.5 afeb Flushing well per RN Cx pending  Will follow

## 2015-04-07 NOTE — Clinical Social Work Note (Signed)
Clinical Social Work Assessment  Patient Details  Name: Kerry Morris MRN: [id number] Date of Birth: 10/27/1965  Date of referral:  04/06/15               Reason for consult:  Mental Health Concerns                Permission sought to share information with:  Other Permission granted to share information::  No  Name::        Agency::     Relationship::     Contact Information:     Housing/Transportation Living arrangements for the past 2 months:  Single Family Home Source of Information:  Patient Patient Interpreter Needed:  None Criminal Activity/Legal Involvement Pertinent to Current Situation/Hospitalization:  No  Significant Relationships:  Spouse, Warehouse manager, Friend Lives with:  Spouse "Kirtland Bouchard" Do you feel safe going back to the place where you live?  Yes Need for family participation in patient care:  No   Care giving concerns:    None identified.   Social Worker assessment / plan:  CSW spent approximately 45 minutes with the patient in order to provide brief therapy and crisis counseling.   Patient presented as easily engaged and receptive to the visit.  Patient expressed appreciation for the visit and support, and immediately began processing the death of a woman who she identified as a mother figure. Patient shared that the death occurred in 04-Mar-2023, and she continues to be in a state of denial about the loss. She reflected upon the events that led to her death, and how she has been coping with the sudden loss.  Patient shared that she has internalized a lot of feelings about the loss, but is now beginning to be in a state of readiness to talk about her feelings with others and create a memorial in her honor.  Patient that she is constantly reminded of the loss, and reflected upon how she continues to grieve.  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 She processed additional  stressors related to her husband's health, and the belief that it is "one bad thing after another".   Patient expressed gratitude for the opportunity to talk about her feelings, and presented as ready to begin to problem solve on how to move forward and cope with her feelings.   Patient shared that she hopes to return to regular exercise in the near future. She discussed how exercise supports her mental health.  Patient also reported desire to reach out to her natural supports who can also related to her loss.  Patient shared that she wants to re-start therapy with her previous therapist in order to continue to receive ongoing mental health support.  Patient was also receptive during the visit on how to utilize cognitive techniques to assist her identify irrational thoughts and how to disengage from irrational thoughts.  Patient presented with insight on how her one anxious thought can trigger subsequent thoughts and led to feelings of depression. She stated that she continues to remind herself to focus on the present and that "everything will be okay".   CSW continued to explore with patient her values and her sense of purpose in life.  Patient expressed hope for the future, and recognized how situations and stressors are temporary and can change for the better.   Employment status:  [employer] information:  Self Pay (Medicaid Pending) PT Recommendations:  Not assessed  at this time Information / Referral to community resources:  None since patient reported interest in re-starting therapy with her previous therapist.  Patient reported that she has their contact information.   Patient/Family's Response to care:   Patient stated that she is attempting to take it "day by day", and shared that she is continuing to adjust to her current medical condition. Patient shared that she eager to learn more about potential reasons that led to her medical needs at this time, and reported willingness and  readiness to make any behavioral changes to improve health outcomes.   Emotional Assessment Appearance:  Appears stated age, Developmentally appropriate Attitude/Demeanor/Rapport:  Other Affect (typically observed):  Full range in affect, Tearful/Crying, Orientation:  Oriented to Self, Oriented to Place, Oriented to  Time, Oriented to Situation Alcohol / Substance use:  Not Applicable Psych involvement (Current and /or in the community): Patient is currently prescribed Xanax. Patient reported that she intends to follow up with a previous therapist in order to receive ongoing mental health support.   Discharge Needs  Concerns to be addressed:  No discharge needs identified Readmission within the last 30 days:  No Current discharge risk:  None Barriers to Discharge:  No Barriers Identified   Sheilah Mins, LCSW 04/07/2015, 4:34 PM

## 2015-04-08 LAB — COMPREHENSIVE METABOLIC PANEL
ALBUMIN: 2.2 g/dL — AB (ref 3.5–5.0)
ALT: 20 U/L (ref 14–54)
AST: 21 U/L (ref 15–41)
Alkaline Phosphatase: 77 U/L (ref 38–126)
Anion gap: 7 (ref 5–15)
BILIRUBIN TOTAL: 0.5 mg/dL (ref 0.3–1.2)
BUN: 11 mg/dL (ref 6–20)
CHLORIDE: 101 mmol/L (ref 101–111)
CO2: 28 mmol/L (ref 22–32)
CREATININE: 0.58 mg/dL (ref 0.44–1.00)
Calcium: 8.1 mg/dL — ABNORMAL LOW (ref 8.9–10.3)
GFR calc Af Amer: >60 mL/min (ref >60)
GFR calc non Af Amer: >60 mL/min (ref >60)
GLUCOSE: 96 mg/dL (ref 65–99)
POTASSIUM: 3.4 mmol/L — AB (ref 3.5–5.1)
Sodium: 136 mmol/L (ref 135–145)
Total Protein: 5.8 g/dL — ABNORMAL LOW (ref 6.5–8.1)

## 2015-04-08 LAB — CBC WITH DIFFERENTIAL/PLATELET
BASOS ABS: 0 10*3/uL (ref 0.0–0.1)
BASOS PCT: 0 %
Eosinophils Absolute: 0.1 10*3/uL (ref 0.0–0.7)
Eosinophils Relative: 1 %
HEMATOCRIT: 32.6 % — AB (ref 36.0–46.0)
Hemoglobin: 11.6 g/dL — ABNORMAL LOW (ref 12.0–15.0)
LYMPHS PCT: 11 %
Lymphs Abs: 1.1 10*3/uL (ref 0.7–4.0)
MCH: 31.8 pg (ref 26.0–34.0)
MCHC: 35.6 g/dL (ref 30.0–36.0)
MCV: 89.3 fL (ref 78.0–100.0)
MONO ABS: 0.2 10*3/uL (ref 0.1–1.0)
Monocytes Relative: 2 %
NEUTROS ABS: 9.2 10*3/uL — AB (ref 1.7–7.7)
NEUTROS PCT: 86 %
Platelets: 285 10*3/uL (ref 150–400)
RBC: 3.65 MIL/uL — AB (ref 3.87–5.11)
RDW: 13.4 % (ref 11.5–15.5)
WBC: 10.6 10*3/uL — AB (ref 4.0–10.5)

## 2015-04-08 NOTE — Progress Notes (Signed)
Patient ID: Kerry Morris, female   DOB: Apr 14, 1966, 49 y.o.   MRN: [id number]   Bilateral tubo ovarian abscess drains placed 11/9  R TG drains x 2 placed (one crosses pelvis to L salpinx)  RN states pt feels better today Much less pain  Output serous color 10 and 12 cc per drain Cx: gram + rods Flushing 3 x/day  Afeb; wbc 10.6  Will follow via Epic and phone Will need Re CT when output scant before drain removal

## 2015-04-08 NOTE — Progress Notes (Signed)
Patient ID: Kerry Morris, female   DOB: September 05, 1965, 49 y.o.   MRN: [id number]   Subjective: Interval History:s/p IR drainage of Bilateral pyosalpinx.  Reports pain is improved. Not very mobile yet. Drains are continuing to drain. One culture has Gram positive Bacilli in it. No other complaints.  Objective: Vital signs in last 24 hours: Temp:  [97.6 F (36.4 C)-98.3 F (36.8 C)] 98 F (36.7 C) (11/11 [id number]) Pulse Rate:  [75-80] 80 (11/11 0611) Resp:  [16-28] 18 (11/11 0611) BP: (96-103)/(59-64) 96/59 mmHg (11/11 0611) SpO2:  [97 %-100 %] 97 % (11/11 0611)  Intake/Output from previous day: 11/10 0701 - 11/11 0700 In: 670 [P.O.:660] Out: 472.5 [Urine:450; Drains:22.5] Intake/Output this shift:   General appearance: alert, cooperative and appears stated age Neck: supple, symmetrical, trachea midline Lungs: normal effort Heart: regular rate and rhythm Abdomen: soft, mild TTP in lower quadrants bilaterally, no rebound Skin: Skin color, texture, turgor normal. No rashes or lesions Neurologic: Grossly normal  Results for orders placed or performed during the hospital encounter of 04/05/15 (from the past 24 hour(s))  Prealbumin     Status: Abnormal   Collection Time: 04/07/15 12:05 PM  Result Value Ref Range   Prealbumin 3.3 (L) 18 - 38 mg/dL  CBC with Differential/Platelet     Status: Abnormal   Collection Time: 04/08/15  5:05 AM  Result Value Ref Range   WBC 10.6 (H) 4.0 - 10.5 K/uL   RBC 3.65 (L) 3.87 - 5.11 MIL/uL   Hemoglobin 11.6 (L) 12.0 - 15.0 g/dL   HCT 32.6 (L) 36.0 - 46.0 %   MCV 89.3 78.0 - 100.0 fL   MCH 31.8 26.0 - 34.0 pg   MCHC 35.6 30.0 - 36.0 g/dL   RDW 13.4 11.5 - 15.5 %   Platelets 285 150 - 400 K/uL   Neutrophils Relative % 86 %   Neutro Abs 9.2 (H) 1.7 - 7.7 K/uL   Lymphocytes Relative 11 %   Lymphs Abs 1.1 0.7 - 4.0 K/uL   Monocytes Relative 2 %   Monocytes Absolute 0.2 0.1 - 1.0 K/uL   Eosinophils Relative 1 %   Eosinophils Absolute 0.1 0.0 - 0.7  K/uL   Basophils Relative 0 %   Basophils Absolute 0.0 0.0 - 0.1 K/uL  Comprehensive metabolic panel     Status: Abnormal   Collection Time: 04/08/15  5:05 AM  Result Value Ref Range   Sodium 136 135 - 145 mmol/L   Potassium 3.4 (L) 3.5 - 5.1 mmol/L   Chloride 101 101 - 111 mmol/L   CO2 28 22 - 32 mmol/L   Glucose, Bld 96 65 - 99 mg/dL   BUN 11 6 - 20 mg/dL   Creatinine, Ser 0.58 0.44 - 1.00 mg/dL   Calcium 8.1 (L) 8.9 - 10.3 mg/dL   Total Protein 5.8 (L) 6.5 - 8.1 g/dL   Albumin 2.2 (L) 3.5 - 5.0 g/dL   AST 21 15 - 41 U/L   ALT 20 14 - 54 U/L   Alkaline Phosphatase 77 38 - 126 U/L   Total Bilirubin 0.5 0.3 - 1.2 mg/dL   GFR calc non Af Amer >60 >60 mL/min   GFR calc Af Amer >60 >60 mL/min   Anion gap 7 5 - 15   Specimen Description ABSCESS LEFT PELVIS TUBO OVARIAN   Special Requests NONE   Gram Stain ABUNDANT WBC PRESENT, PREDOMINANTLY PMN  NO SQUAMOUS EPITHELIAL CELLS SEEN  FEW GRAM POSITIVE RODS  Speciation pending   Scheduled Meds: . cefoTEtan (CEFOTAN) IV  2 g Intravenous Q12H  . doxycycline (VIBRAMYCIN) IV  100 mg Intravenous Q12H  . famotidine  40 mg Oral BID  . prenatal multivitamin  1 tablet Oral Q1200   Continuous Infusions: . lactated ringers 100 mL/hr at 04/08/15 0529   PRN Meds:ibuprofen, oxyCODONE-acetaminophen  Assessment/Plan: Patient Active Problem List   Diagnosis Date Noted  . TOA (tubo-ovarian abscess) 04/06/2015  Will continue antibiotics for now Continue analgesia Will follow up culture speciation results    LOS: 2 days   Cavan Bearden A, MD 04/08/2015 9:23 AM

## 2015-04-09 ENCOUNTER — Other Ambulatory Visit: Payer: Self-pay | Admitting: Radiology

## 2015-04-09 DIAGNOSIS — N7093 Salpingitis and oophoritis, unspecified: Secondary | ICD-10-CM

## 2015-04-09 LAB — COMPREHENSIVE METABOLIC PANEL
ALBUMIN: 2.2 g/dL — AB (ref 3.5–5.0)
ALK PHOS: 76 U/L (ref 38–126)
ALT: 20 U/L (ref 14–54)
AST: 39 U/L (ref 15–41)
Anion gap: 8 (ref 5–15)
BUN: 7 mg/dL (ref 6–20)
CALCIUM: 8.5 mg/dL — AB (ref 8.9–10.3)
CHLORIDE: 99 mmol/L — AB (ref 101–111)
CO2: 32 mmol/L (ref 22–32)
CREATININE: 0.59 mg/dL (ref 0.44–1.00)
GFR calc Af Amer: >60 mL/min (ref >60)
GFR calc non Af Amer: >60 mL/min (ref >60)
GLUCOSE: 96 mg/dL (ref 65–99)
Potassium: 3.4 mmol/L — ABNORMAL LOW (ref 3.5–5.1)
SODIUM: 139 mmol/L (ref 135–145)
Total Bilirubin: 0.4 mg/dL (ref 0.3–1.2)
Total Protein: 6.4 g/dL — ABNORMAL LOW (ref 6.5–8.1)

## 2015-04-09 LAB — CBC WITH DIFFERENTIAL/PLATELET
BASOS ABS: 0 10*3/uL (ref 0.0–0.1)
Basophils Relative: 0 %
EOS ABS: 0.1 10*3/uL (ref 0.0–0.7)
Eosinophils Relative: 1 %
HCT: 34.4 % — ABNORMAL LOW (ref 36.0–46.0)
HEMOGLOBIN: 12.1 g/dL (ref 12.0–15.0)
LYMPHS ABS: 1.9 10*3/uL (ref 0.7–4.0)
Lymphocytes Relative: 33 %
MCH: 31.4 pg (ref 26.0–34.0)
MCHC: 35.2 g/dL (ref 30.0–36.0)
MCV: 89.4 fL (ref 78.0–100.0)
Monocytes Absolute: 0.2 10*3/uL (ref 0.1–1.0)
Monocytes Relative: 4 %
NEUTROS PCT: 62 %
Neutro Abs: 3.5 10*3/uL (ref 1.7–7.7)
Platelets: 392 10*3/uL (ref 150–400)
RBC: 3.85 MIL/uL — AB (ref 3.87–5.11)
RDW: 13.7 % (ref 11.5–15.5)
WBC: 5.7 10*3/uL (ref 4.0–10.5)

## 2015-04-09 MED ORDER — OXYCODONE-ACETAMINOPHEN 5-325 MG PO TABS: 1.0000 | ORAL_TABLET | ORAL | Status: DC | PRN

## 2015-04-09 MED ORDER — DOXYCYCLINE HYCLATE 100 MG PO CAPS: 100.0000 mg | ORAL_CAPSULE | Freq: Two times a day (BID) | ORAL | Status: DC

## 2015-04-09 MED ORDER — DOXYCYCLINE HYCLATE 100 MG IV SOLR: 100.0000 mg | Freq: Two times a day (BID) | INTRAVENOUS | Status: DC

## 2015-04-09 MED ORDER — IBUPROFEN 600 MG PO TABS: 600.0000 mg | ORAL_TABLET | Freq: Four times a day (QID) | ORAL | Status: DC | PRN

## 2015-04-09 MED ORDER — FAMOTIDINE 40 MG PO TABS: 40.0000 mg | ORAL_TABLET | Freq: Two times a day (BID) | ORAL | Status: DC

## 2015-04-09 MED ORDER — CIPROFLOXACIN HCL 500 MG PO TABS: 500.0000 mg | ORAL_TABLET | Freq: Two times a day (BID) | ORAL | Status: DC

## 2015-04-09 NOTE — Discharge Summary (Signed)
Physician Discharge Summary  Patient ID: Makenleigh Sparlin MRN: [id number] DOB/AGE: 1966/01/17 49 y.o.  Admit date: 04/05/2015 Discharge date: 04/09/2015  Admission Diagnoses: TOA  Discharge Diagnoses:  Principal Problem:   TOA (tubo-ovarian abscess)   Discharged Condition: stable  Hospital Course: pt with bilateral tubo ovarian abscess uynderwnet IR bilateral drainage successfully Pain and clinically improved Culture with diptheroids only  Consults: IR  Significant Diagnostic Studies: radiology: IR  Treatments: antibiotics: cefotan/doxycycline and IR drainage  Discharge Exam: Blood pressure 124/65, pulse 67, temperature 97.9 F (36.6 C), temperature source Oral, resp. rate 16, height 5\' 5"  (1.651 m), weight 110 lb (49.896 kg), SpO2 99 %. General appearance: alert, cooperative and no distress GI: benign no rebound mild tenderness not bloated  Disposition:   Discharge Instructions    Call MD for:  persistant nausea and vomiting    Complete by:  As directed      Call MD for:  severe uncontrolled pain    Complete by:  As directed      Call MD for:  temperature >100.4    Complete by:  As directed      Diet - low sodium heart healthy    Complete by:  As directed      Discharge wound care:    Complete by:  As directed   Instruct patient on drain care     Increase activity slowly    Complete by:  As directed             Medication List    TAKE these medications        ciprofloxacin 500 MG tablet  Commonly known as:  CIPRO  Take 1 tablet (500 mg total) by mouth 2 (two) times daily.     ciprofloxacin 500 MG tablet  Commonly known as:  CIPRO  Take 500 mg by mouth every 12 (twelve) hours.     doxycycline 100 mg in dextrose 5 % 250 mL  Inject 100 mg into the vein every 12 (twelve) hours.     famotidine 40 MG tablet  Commonly known as:  PEPCID  Take 1 tablet (40 mg total) by mouth 2 (two) times daily.     ibuprofen 600 MG tablet  Commonly known as:   ADVIL,MOTRIN  Take 1 tablet (600 mg total) by mouth every 6 (six) hours as needed (mild pain).     oxyCODONE-acetaminophen 5-325 MG tablet  Commonly known as:  PERCOCET/ROXICET  Take 1-2 tablets by mouth every 3 (three) hours as needed (moderate to severe pain (when tolerating fluids)).     XANAX 0.5 MG tablet  Generic drug:  ALPRAZolam  Take 0.5 mg by mouth at bedtime as needed for anxiety.           Follow-up Information    Follow up with [facility] On 04/13/2015.   Why:  follow up   Contact information:   [place] [phone]      Signed: Florian Buff 04/09/2015, 9:08 AM

## 2015-04-09 NOTE — Discharge Instructions (Signed)

## 2015-04-09 NOTE — Progress Notes (Signed)
Pt is discharged in the care of husband,with N>T> escort.. Denies any pain or discomfort . Discharged instructions with Rx were given to pt and husband. Husband and pt were able to demonstrate the wound care involve ,emptying measuring and recording wound drainage.All discharge instructions were given to pt.Marland Kitchen

## 2015-04-10 LAB — CULTURE, ROUTINE-ABSCESS

## 2015-04-11 ENCOUNTER — Other Ambulatory Visit: Payer: Self-pay | Admitting: Obstetrics & Gynecology

## 2015-04-11 DIAGNOSIS — N7093 Salpingitis and oophoritis, unspecified: Secondary | ICD-10-CM

## 2015-04-19 ENCOUNTER — Ambulatory Visit
Admission: RE | Admit: 2015-04-19 | Discharge: 2015-04-19 | Disposition: A | Discharge status: Home / Self Care | Payer: No Typology Code available for payment source | Source: Ambulatory Visit | Attending: Obstetrics & Gynecology | Admitting: Obstetrics & Gynecology

## 2015-04-19 ENCOUNTER — Ambulatory Visit
Admission: RE | Admit: 2015-04-19 | Discharge: 2015-04-19 | Disposition: A | Discharge status: Home / Self Care | Payer: No Typology Code available for payment source | Source: Ambulatory Visit | Attending: Radiology | Admitting: Radiology

## 2015-04-19 DIAGNOSIS — N7093 Salpingitis and oophoritis, unspecified: Secondary | ICD-10-CM

## 2015-04-19 HISTORY — DX: Anxiety disorder, unspecified: F41.9

## 2015-04-19 HISTORY — DX: Depression, unspecified: F32.A

## 2015-04-19 HISTORY — DX: Major depressive disorder, single episode, unspecified: F32.9

## 2015-04-19 MED ORDER — IOPAMIDOL (ISOVUE-300) INJECTION 61%
100.0000 mL | Freq: Once | INTRAVENOUS | Status: AC | PRN
  Administered 2015-04-19: 100 mL via INTRAVENOUS

## 2015-04-19 NOTE — Consult Note (Signed)
Chief Complaint: Patient was seen in consultation today for follow-up of tubo-ovarian abscess and percutaneous drainage catheters.  Referring Physician(s): Eliott Nine, MD  History of Present Illness: Kerry Morris is a 49 y.o. female with tubo-ovarian abscesses and placement of bilateral percutaneous abscess drains on 04/06/2015. The patient has been feeling better but she is having difficulty sleeping due to discomfort from the right transgluteal drains. The patient has been on 2 antibiotics. She denies any fevers. The drainage from the catheters has been between 10-15 mL a day but at least 5 mL is coming from the flushes. Patient recently had some chest pain which was self limiting and she felt was probably related to gas buildup.  Past Medical History  Diagnosis Date  . Anxiety and depression   . Depression   . Anxiety     Past Surgical History  Procedure Laterality Date  . Dental surgery    . Cervical biopsy  w/ loop electrode excision      Allergies: Review of patient's allergies indicates no known allergies.  Medications: Prior to Admission medications   Medication Sig Start Date End Date Taking? Authorizing Provider  ALPRAZolam Duanne Moron) 0.5 MG tablet Take 0.5 mg by mouth at bedtime as needed for anxiety.    Historical Provider, MD  ciprofloxacin (CIPRO) 500 MG tablet Take 1 tablet (500 mg total) by mouth 2 (two) times daily. 04/09/15   Florian Buff, MD  doxycycline (VIBRAMYCIN) 100 MG capsule Take 1 capsule (100 mg total) by mouth 2 (two) times daily. 04/09/15   Donnamae Jude, MD  famotidine (PEPCID) 40 MG tablet Take 1 tablet (40 mg total) by mouth 2 (two) times daily. 04/09/15   Florian Buff, MD  ibuprofen (ADVIL,MOTRIN) 600 MG tablet Take 1 tablet (600 mg total) by mouth every 6 (six) hours as needed (mild pain). 04/09/15   Florian Buff, MD  oxyCODONE-acetaminophen (PERCOCET/ROXICET) 5-325 MG tablet Take 1-2 tablets by mouth every 3 (three) hours as needed  (moderate to severe pain (when tolerating fluids)). 04/09/15   Florian Buff, MD     Family History  Problem Relation Age of Onset  . Leukemia Mother     Social History   Social History  . Marital Status: Married    Spouse Name: N/A  . Number of Children: N/A  . Years of Education: N/A   Occupational History  . 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 Smoking status: Current Every Day Smoker -- 1.00 packs/day for 25 years    Types: Cigarettes  . Smokeless tobacco: Not on file  . Alcohol Use: No     Comment: Previous h/o alcoholism, sober x 8 years  . Drug Use: Yes    Special: Marijuana     Comment: remote h/o IVDU, has been worked up for parenterally transmitted infections multiple times since  . Sexual Activity:    Partners: Male   Other Topics Concern  . Not on file   Social History Narrative   Lives with husband who is also her business partner. They own and run a bar/club. Recent financial difficulties have been a significant source of stress for them. In addition, patient is grieving the loss of a close family member/friend in Oct 2016. As a result, [name] reports poor diet for the past several months with notable unintentional weight loss.      Review of Systems  Constitutional: Negative for fever.  Cardiovascular: Positive for chest pain.  Vital Signs: BP 92/61 mmHg  Pulse 76  Temp(Src) 97.7 F (36.5 C) (Oral)  SpO2 98%  Physical Exam  Genitourinary:  Two right transgluteal drains are intact.  No drainage around catheters and no significant erythema.  Small amount of yellow fluid in both drain bags.         Imaging: Ct Abdomen Pelvis W Contrast  04/05/2015  CLINICAL DATA:  Abdominal cramping with low abdominal pain. EXAM: CT ABDOMEN AND PELVIS WITH CONTRAST TECHNIQUE: Multidetector CT imaging of the abdomen and pelvis was performed using the standard protocol following bolus administration of intravenous contrast. CONTRAST:   43mL OMNIPAQUE IOHEXOL 300 MG/ML  SOLN COMPARISON:  None. FINDINGS: Lower chest and abdominal wall:  No contributory findings. Hepatobiliary: Lobulated 1 cm cyst in the central liver with tiny neighboring low-density likely also a cyst.No evidence of biliary obstruction or stone. Pancreas: Calcification in the pancreatic head region is near the main duct but appears parenchymal rather than ductal. Pancreas appears prominent in volume diffusely but there is no indication of acute inflammation and no pancreatic soft tissue rind. Spleen: Unremarkable. Adrenals/Urinary Tract: Negative adrenals. Mild bilateral hydronephrosis, likely due to mass effect in the pelvis. No obstructing stone. Symmetric normal renal enhancement. Unremarkable bladder. Reproductive:The uterus is enlarged by innumerable enhancing masses, distorting the endometrial cavity. A partly calcified mass sub serosal posterior uterine body measures 5 cm. There are tubular fluid-filled structures in the bilateral adnexa, contiguous with/ indistinguishable from the ovaries, consistent with hydrosalpinx or pyosalpinx. The left tube has thicker walls and more partial septations. The right adnexal collection measures at least 14 cm in length by 4 cm in diameter. The left adnexal collection also measures 4 cm in diameter with length difficult to estimate due to tortuous course. Pelvic and right pericolic gutter fluid is present without peritoneal abscess. No fluid in the hepaticrenal fossa. Stomach/Bowel:  No obstruction. No appendicitis. Vascular/Lymphatic: No acute vascular abnormality. Aortic and branch vessel atherosclerosis. Mild enlargement of periaortic lymph nodes, presumably reactive to the adnexal findings. Peritoneal: Ascites is described above.  No pneumoperitoneum. Musculoskeletal: No acute abnormalities. IMPRESSION: 1. Large bilateral hydrosalpinx/pyosalpinx. 2. Enlarged fibroid uterus. 3. Mild bilateral hydronephrosis from #1. Electronically  Signed   By: Monte Fantasia M.D.   On: 04/05/2015 21:38   Ct Image Guided Drainage Percut Cath  Peritoneal Retroperit  04/06/2015  CLINICAL DATA:  49 year old female with a history of bilateral tubo-ovarian abscess. EXAM: CT GUIDED DRAINAGE OF BILATERAL TUBO-OVARIAN ABSCESS ANESTHESIA/SEDATION: 1.5  Mg IV Versed; 50 mcg IV Fentanyl Total Moderate Sedation Time: 47 minutes. PROCEDURE: The procedure risks, benefits, and alternatives were explained to the patient and the patient's family. Questions regarding the procedure were encouraged and answered. The patient understands and consents to the procedure. The patient is positioned prone position on the CT table and scout CT of the pelvis was performed for planning purposes. A right trans gluteal approach was selected. The right gluteal region was prepped with Betadinein a sterile fashion, and a sterile drape was applied covering the operative field. A sterile gown and sterile gloves were used for the procedure. Local anesthesia was provided with 1% Lidocaine. Once the patient is prepped and draped sterilely, the skin and subcutaneous tissues through the musculature were generously infiltrated with 1% lidocaine for local anesthesia. A 15 cm Yueh needle was then advanced in a trans gluteal approach into the right-sided tubo-ovarian abscess. Using modified Seldinger technique, a 10 French drain was placed after 10 French dilation of the soft tissues. Confirmation  of location was with CT and with aspiration of fluid. A sample was sent to the lab. A second right-sided trans gluteal 15 cm Yueh needle then placed into the left-sided ovarian abscess, as no safe window to the left-sided collection was identified. Modified Seldinger technique was then used to place a 10 [place] drain into left-sided tubo-ovarian abscess. A sample was sent to the lab. Both of the drains were sutured in position and aspirated. Each of these was attached to gravity drainage. Patient tolerated  the procedure well and remained hemodynamically stable throughout. No complications encountered and no significant blood loss encountered. FINDINGS: Scout CT demonstrates bilateral tubo-ovarian fluid collection. The only safe approach was right trans gluteal. Brown thin fluid was aspirated from the right sided fluid collection. Frank purulent material was aspirated from the left-sided collection. IMPRESSION: Status post CT-guided drainage of right and left tubo-ovarian abscess, with parallel drains from a right trans gluteal approach. Sample was sent from both the left and the right fluid collections to the lab. Signed, Dulcy Fanny. Earleen Newport, DO [facility] Radiology Electronically Signed   By: Corrie Mckusick D.O.   On: 04/06/2015 18:12   Ct Image Guided Drainage Percut Cath  Peritoneal Retroperit  04/06/2015  CLINICAL DATA:  49 year old female with a history of bilateral tubo-ovarian abscess. EXAM: CT GUIDED DRAINAGE OF BILATERAL TUBO-OVARIAN ABSCESS ANESTHESIA/SEDATION: 1.5  Mg IV Versed; 50 mcg IV Fentanyl Total Moderate Sedation Time: 47 minutes. PROCEDURE: The procedure risks, benefits, and alternatives were explained to the patient and the patient's family. Questions regarding the procedure were encouraged and answered. The patient understands and consents to the procedure. The patient is positioned prone position on the CT table and scout CT of the pelvis was performed for planning purposes. A right trans gluteal approach was selected. The right gluteal region was prepped with Betadinein a sterile fashion, and a sterile drape was applied covering the operative field. A sterile gown and sterile gloves were used for the procedure. Local anesthesia was provided with 1% Lidocaine. Once the patient is prepped and draped sterilely, the skin and subcutaneous tissues through the musculature were generously infiltrated with 1% lidocaine for local anesthesia. A 15 cm Yueh  needle was then advanced in a trans gluteal approach into the right-sided tubo-ovarian abscess. Using modified Seldinger technique, a 10 French drain was placed after 10 French dilation of the soft tissues. Confirmation of location was with CT and with aspiration of fluid. A sample was sent to the lab. A second right-sided trans gluteal 15 cm Yueh needle then placed into the left-sided ovarian abscess, as no safe window to the left-sided collection was identified. Modified Seldinger technique was then used to place a 10 [place] drain into left-sided tubo-ovarian abscess. A sample was sent to the lab. Both of the drains were sutured in position and aspirated. Each of these was attached to gravity drainage. Patient tolerated the procedure well and remained hemodynamically stable throughout. No complications encountered and no significant blood loss encountered. FINDINGS: Scout CT demonstrates bilateral tubo-ovarian fluid collection. The only safe approach was right trans gluteal. Brown thin fluid was aspirated from the right sided fluid collection. Frank purulent material was aspirated from the left-sided collection. IMPRESSION: Status post CT-guided drainage of right and left tubo-ovarian abscess, with parallel drains from a right trans gluteal approach. Sample was sent from both the left and the right fluid collections to the lab. Signed, Dulcy Fanny. Earleen Newport, DO [facility] Radiology Electronically  Signed   By: Corrie Mckusick D.O.   On: 04/06/2015 18:12   CLINICAL DATA: 49 year old with history of bilateral tubal ovarian abscess and treatment with antibiotics and percutaneous drainage catheters. Patient presents for follow-up imaging.  EXAM: CT ABDOMEN AND PELVIS WITH CONTRAST  TECHNIQUE: Multidetector CT imaging of the abdomen and pelvis was performed using the standard protocol following bolus administration of intravenous contrast.  CONTRAST: 149mL  ISOVUE-300 IOPAMIDOL (ISOVUE-300) INJECTION 61%  COMPARISON: 04/05/2015  FINDINGS: Lower chest: Patient has developed a small left pleural effusion. Otherwise, the lung bases are clear.  Hepatobiliary: There is a stable 1.2 cm low-density structure in the medial left hepatic lobe which probably represents a cyst. Additional small low-density structures in the liver are suggestive for additional cysts. No acute abnormality in the liver. The portal venous system is patent. Normal appearance of the gallbladder.  Pancreas: There is a stable calcification in the central aspect of the pancreatic head. No suspicious pancreatic lesions or inflammation.  Spleen: Within normal limits in size and appearance.  Adrenals/Urinary Tract: Bilateral adrenal glands appear normal. Normal appearance of both kidneys. Small amount of fluid in urinary bladder.  Stomach/Bowel: No acute abnormality involving the stomach or duodenum. Moderate amount of stool throughout the colon. No acute abnormality in the small or large bowel.  Vascular/Lymphatic: Negative for an abdominal aortic aneurysm. There is no significant abdominal or pelvic lymphadenopathy.  Reproductive: Again noted are 2 percutaneous drainage catheters entering from a right transgluteal approach. One catheter extends into the right hemipelvis and the other crosses the midline and terminates in the lower left hemipelvis. The previously large fluid-filled structures throughout the pelvis have resolved. There are no residual fluid or abscess collections in the pelvis. Again noted is an enlarged amorphous uterus containing multiple fibroids. There is a large posterior fibroid containing calcifications that measures at least 4.4 cm. Again noted is a large exophytic fibroid along the left anterior aspect of the uterus measuring up to 7.5 cm. Limited evaluation of the ovaries.  Other: Small amount of fluid posterior to the uterus.  There is fat stranding in the anterior lower abdomen and pelvis and fat stranding in the left superficial abdominal fat. Findings are nonspecific but could be related to inflammation or mild edema.  Musculoskeletal: No acute bone abnormality.  IMPRESSION: The bilateral pelvic fluid collections have resolved following placement of the percutaneous drainage catheters. Small amount of fluid in the pelvis but no evidence for residual abscess collections or hydrosalpinx.  Fibroid uterus. Innumerable uterine fibroids. Many of these fibroids are pedunculated.  Development of a small left pleural effusion.  Mild fat stranding in the abdominal and pelvic mesentery suggestive for mild edema.   Electronically Signed  By: Markus Daft M.D.  On: 04/19/2015 14:06    Labs:  CBC:  Recent Labs  04/06/15 0540 04/07/15 0520 04/08/15 0505 04/09/15 0608  WBC 18.9* 18.5* 10.6* 5.7  HGB 13.3 12.3 11.6* 12.1  HCT 38.1 35.5* 32.6* 34.4*  PLT 292 283 285 392    COAGS:  Recent Labs  04/06/15 1005  INR 1.09  APTT 35    BMP:  Recent Labs  04/06/15 0540 04/07/15 0520 04/08/15 0505 04/09/15 0608  NA 132* 134* 136 139  K 3.7 3.4* 3.4* 3.4*  CL 96* 98* 101 99*  CO2 25 29 28  32  GLUCOSE 110* 118* 96 96  BUN 10 12 11 7   CALCIUM 8.3* 8.3* 8.1* 8.5*  CREATININE 0.65 0.60 0.58 0.59  GFRNONAA >60 >60 >60 >  60  GFRAA >60 >60 >60 >60    LIVER FUNCTION TESTS:  Recent Labs  04/06/15 0540 04/07/15 0520 04/08/15 0505 04/09/15 0608  BILITOT 1.1 0.5 0.5 0.4  AST 32 23 21 39  ALT 44 27 20 20   ALKPHOS 114 91 77 76  PROT 6.8 6.3* 5.8* 6.4*  ALBUMIN 2.7* 2.3* 2.2* 2.2*    TUMOR MARKERS: No results for input(s): AFPTM, CEA, CA199, CHROMGRNA in the last 8760 hours.  Assessment and Plan:  49 year old with history of bilateral tubo-ovarian abscesses. The patient was treated with antibiotics and percutaneous drainage catheters. CT examination from today demonstrates  resolution of the tubo-ovarian abscesses. In general, the patient is feeling better. The right transgluteal drains were successfully removed without complication.  Patient will complete her antibiotic treatments and follow up with OB/GYN.  Thank you for this interesting consult.  I greatly enjoyed meeting Faizah Klingshirn and look forward to participating in their care.  A copy of this report was sent to the requesting provider on this date.  SignedCarylon Perches 04/19/2015, 2:07 PM   I spent a total of  10 Minutes in face to face in clinical consultation, greater than 50% of which was counseling/coordinating care for the tubo-ovarian abscesses.

## 2015-04-26 ENCOUNTER — Encounter: Payer: Self-pay | Admitting: Student

## 2015-04-26 ENCOUNTER — Telehealth: Payer: Self-pay

## 2015-04-26 ENCOUNTER — Other Ambulatory Visit (HOSPITAL_COMMUNITY): Payer: Self-pay | Admitting: Student

## 2015-04-26 ENCOUNTER — Ambulatory Visit (INDEPENDENT_AMBULATORY_CARE_PROVIDER_SITE_OTHER): Payer: Self-pay | Admitting: Student

## 2015-04-26 VITALS — BP 115/83 | HR 98 | Temp 99.2°F | Wt 109.4 lb

## 2015-04-26 DIAGNOSIS — N7093 Salpingitis and oophoritis, unspecified: Secondary | ICD-10-CM

## 2015-04-26 DIAGNOSIS — D259 Leiomyoma of uterus, unspecified: Secondary | ICD-10-CM | POA: Insufficient documentation

## 2015-04-26 NOTE — Progress Notes (Signed)
Subjective:     Patient ID: Kerry Morris, female   DOB: 01-18-1966, 49 y.o.   MRN: [id number]  HPI Kerry Morris is a 49 y.o. female who presents today for follow up for tubo ovarian abscess. Bilateral TOA that she was admitted to [facility] hospital for on 11/9. Drains removed 11/22 & CT showed resolution of abscesses.  Today, pt reports that she is starting to feel her symptoms return. Reports some abdominal distension & lower abdominal pain that is worse in the LLQ.  Denies fever or chills. Reports normal BMs and flatulence.   Review of Systems  Constitutional: Negative for fever and chills.  Gastrointestinal: Positive for abdominal pain and abdominal distention. Negative for nausea, vomiting, diarrhea and constipation.  Genitourinary: Positive for pelvic pain. Negative for dysuria, urgency, flank pain, vaginal bleeding and vaginal discharge.       Objective:   Physical Exam  Constitutional: She appears well-developed and well-nourished. No distress.  HENT:  Head: Normocephalic and atraumatic.  Cardiovascular: Normal rate, regular rhythm and normal heart sounds.   Pulmonary/Chest: Effort normal and breath sounds normal. No respiratory distress. She has no wheezes.  Abdominal: Bowel sounds are normal. She exhibits distension and mass. There is tenderness. There is no rebound and no guarding.  Skin: She is not diaphoretic.   BP 115/83 mmHg  Pulse 98  Temp(Src) 99.2 F (37.3 C) (Oral)  Wt 109 lb 6.4 oz (49.624 kg)     Assessment:     C/w Dr. Harolyn Rutherford. Will schedule outpatient ultrasound with patient to f/u in clinic with MD.      Plan:     1. TOA (tubo-ovarian abscess)  - US Pelvis Complete; Future - US Transvaginal Non-OB; Future       Jorje Guild, NP

## 2015-04-26 NOTE — Patient Instructions (Addendum)
Call [phone] for free pap smear clinic OR call health department @ [phone] for sliding scale        Pap Test WHY AM I HAVING THIS TEST? A pap test is sometimes called a pap smear. It is a screening test that is used to check for signs of cancer of the vagina, cervix, and uterus. The test can also identify the presence of infection or precancerous changes. Your health care provider will likely recommend you have this test done on a regular basis. This test may be done:  Every 3 years, starting at age 83.  Every 5 years, in combination with testing for the presence of human papillomavirus (HPV).  More or less often depending on other medical conditions.  WHAT KIND OF SAMPLE IS TAKEN? Using a small cotton swab, plastic spatula, or brush, your health care provider will collect a sample of cells from the surface of your cervix. Your cervix is the opening to your uterus, also called a womb. Secretions from the cervix and vagina may also be collected. HOW DO I PREPARE FOR THE TEST?  Be aware of where you are in your menstrual cycle. You may be asked to reschedule the test if you are menstruating on the day of the test.  You may need to reschedule if you have a known vaginal infection on the day of the test.  You may be asked to avoid douching or taking a bath the day before or the day of the test.  Some medicines can cause abnormal test results, such as digitalis and tetracycline. Talk with your health care provider before your test if you take one of these medicines. WHAT DO THE RESULTS MEAN? Abnormal test results may indicate a number of health conditions. These may include:  Cancer. Although pap test results cannot be used to diagnose cancer of the cervix, vagina, or uterus, they may suggest the possibility of cancer. Further tests would be required to determine if cancer is present.  Sexually transmitted disease.  Fungal infection.  Parasite infection.  Herpes  infection.  A condition causing or contributing to infertility. It is your responsibility to obtain your test results. Ask the lab or department performing the test when and how you will get your results. Contact your health care provider to discuss any questions you have about your results.   This information is not intended to replace advice given to you by your health care provider. Make sure you discuss any questions you have with your health care provider.   Document Released: 08/04/2002 Document Revised: 06/04/2014 Document Reviewed: 10/05/2013 Elsevier Interactive Patient Education 2016 Elsevier Inc. Uterine Fibroids Uterine fibroids are tissue masses (tumors) that can develop in the womb (uterus). They are also called leiomyomas. This type of tumor is not cancerous (benign) and does not spread to other parts of the body outside of the pelvic area, which is between the hip bones. Occasionally, fibroids may develop in the fallopian tubes, in the cervix, or on the support structures (ligaments) that surround the uterus. You can have one or many fibroids. Fibroids can vary in size, weight, and where they grow in the uterus. Some can become quite large. Most fibroids do not require medical treatment. CAUSES A fibroid can develop when a single uterine cell keeps growing (replicating). Most cells in the human body have a control mechanism that keeps them from replicating without control. SIGNS AND SYMPTOMS Symptoms may include:   Heavy bleeding during your period.  Bleeding or spotting between periods.  Pelvic pain and pressure.  Bladder problems, such as needing to urinate more often (urinary frequency) or urgently.  Inability to reproduce offspring (infertility).  Miscarriages. DIAGNOSIS Uterine fibroids are diagnosed through a physical exam. Your health care provider may feel the lumpy tumors during a pelvic exam. Ultrasonography and an MRI may be done to determine the size,  location, and number of fibroids. TREATMENT Treatment may include:  Watchful waiting. This involves getting the fibroid checked by your health care provider to see if it grows or shrinks. Follow your health care provider's recommendations for how often to have this checked.  Hormone medicines. These can be taken by mouth or given through an intrauterine device (IUD).  Surgery.  Removing the fibroids (myomectomy) or the uterus (hysterectomy).  Removing blood supply to the fibroids (uterine artery embolization). If fibroids interfere with your fertility and you want to become pregnant, your health care provider may recommend having the fibroids removed.  HOME CARE INSTRUCTIONS  Keep all follow-up visits as directed by your health care provider. This is important.  Take medicines only as directed by your health care provider.  If you were prescribed a hormone treatment, take the hormone medicines exactly as directed.  Do not take aspirin, because it can cause bleeding.  Ask your health care provider about taking iron pills and increasing the amount of dark green, leafy vegetables in your diet. These actions can help to boost your blood iron levels, which may be affected by heavy menstrual bleeding.  Pay close attention to your period and tell your health care provider about any changes, such as:  Increased blood flow that requires you to use more pads or tampons than usual per month.  A change in the number of days that your period lasts per month.  A change in symptoms that are associated with your period, such as abdominal cramping or back pain. SEEK MEDICAL CARE IF:  You have pelvic pain, back pain, or abdominal cramps that cannot be controlled with medicines.  You have an increase in bleeding between and during periods.  You soak tampons or pads in a half hour or less.  You feel lightheaded, extra tired, or weak. SEEK IMMEDIATE MEDICAL CARE IF:  You faint.  You have a  sudden increase in pelvic pain.   This information is not intended to replace advice given to you by your health care provider. Make sure you discuss any questions you have with your health care provider.   Document Released: 05/11/2000 Document Revised: 06/04/2014 Document Reviewed: 11/10/2013 Elsevier Interactive Patient Education [employer].

## 2015-04-26 NOTE — Progress Notes (Signed)
Patient ID: Kerry Morris, female   DOB: February 18, 1966, 49 y.o.   MRN: [id number] Pt scheduled for U/S on 12/6 @ 0900&1000.

## 2015-05-03 ENCOUNTER — Ambulatory Visit (HOSPITAL_COMMUNITY): Payer: No Typology Code available for payment source

## 2015-05-03 ENCOUNTER — Ambulatory Visit (HOSPITAL_COMMUNITY)
Admission: RE | Admit: 2015-05-03 | Discharge: 2015-05-03 | Disposition: A | Discharge status: Home / Self Care | Payer: Self-pay | Source: Ambulatory Visit | Attending: Student | Admitting: Student

## 2015-05-03 DIAGNOSIS — N7093 Salpingitis and oophoritis, unspecified: Secondary | ICD-10-CM | POA: Insufficient documentation

## 2015-05-03 DIAGNOSIS — D259 Leiomyoma of uterus, unspecified: Secondary | ICD-10-CM | POA: Insufficient documentation

## 2015-05-04 ENCOUNTER — Emergency Department (HOSPITAL_COMMUNITY)
Admission: EM | Admit: 2015-05-04 | Discharge: 2015-05-04 | Discharge status: Against Medical Advice | Payer: Self-pay | Attending: Emergency Medicine | Admitting: Emergency Medicine

## 2015-05-04 ENCOUNTER — Encounter (HOSPITAL_COMMUNITY): Payer: Self-pay | Admitting: *Deleted

## 2015-05-04 DIAGNOSIS — N939 Abnormal uterine and vaginal bleeding, unspecified: Secondary | ICD-10-CM | POA: Insufficient documentation

## 2015-05-04 NOTE — ED Notes (Signed)
Pt came to [facility] desk and stated she was leaving.

## 2015-05-04 NOTE — ED Notes (Signed)
Pt in stating yesterday she had a transvaginal ultrasound, today c/o cramping and spotting, c/o pain to vagina as well, concerned due to recent PID and abscess, denies fever

## 2015-05-09 ENCOUNTER — Encounter: Payer: Self-pay | Admitting: Obstetrics & Gynecology

## 2015-05-20 ENCOUNTER — Encounter: Payer: Self-pay | Admitting: Obstetrics & Gynecology

## 2015-05-20 ENCOUNTER — Ambulatory Visit (INDEPENDENT_AMBULATORY_CARE_PROVIDER_SITE_OTHER): Payer: Self-pay | Admitting: Obstetrics & Gynecology

## 2015-05-20 VITALS — BP 112/67 | HR 70 | Ht 65.0 in | Wt 119.1 lb

## 2015-05-20 DIAGNOSIS — N7093 Salpingitis and oophoritis, unspecified: Secondary | ICD-10-CM

## 2015-05-20 MED ORDER — FLUCONAZOLE 150 MG PO TABS: 150.0000 mg | ORAL_TABLET | Freq: Once | ORAL | Status: DC

## 2015-05-20 NOTE — Progress Notes (Signed)
Patient ID: Kerry Morris, female   DOB: 09-07-1965, 49 y.o.   MRN: [id number]  Chief Complaint  Patient presents with  . Results  had f/u US after treatment for TOA  HPI Kerry Morris is a 49 y.o. female.  G0P0000 No LMP recorded. Patient is postmenopausal.  Admit date: 04/05/2015 Discharge date: 04/09/2015  Admission Diagnoses: TOA  Discharge Diagnoses:  Principal Problem:  TOA (tubo-ovarian abscess)   Discharged Condition: stable  Hospital Course: pt with bilateral tubo ovarian abscess uynderwnet IR bilateral drainage successfully Pain and clinically improved Culture with diptheroids only  Consults: IR  Significant Diagnostic Studies: radiology: IR  Treatments: antibiotics: cefotan/doxycycline and IR drainage Feels better, slight yellow discharge HPI  Past Medical History  Diagnosis Date  . Anxiety and depression   . Depression   . Anxiety     Past Surgical History  Procedure Laterality Date  . Dental surgery    . Cervical biopsy  w/ loop electrode excision      Family History  Problem Relation Age of Onset  . Leukemia Mother     Social History Social History  Substance Use Topics  . Smoking status: Former Smoker -- 1.00 packs/day for 25 years    Types: Cigarettes    Quit date: 03/28/2015  . Smokeless tobacco: Never Used  . Alcohol Use: No     Comment: Previous h/o alcoholism, sober x 8 years    No Known Allergies  Current Outpatient Prescriptions  Medication Sig Dispense Refill  . Albuterol Sulfate (PROAIR RESPICLICK IN) Inhale 123XX123 mcg into the lungs 3 (three) times daily.    Marland Kitchen ALPRAZolam (XANAX) 0.5 MG tablet Take 0.5 mg by mouth at bedtime as needed for anxiety.    . famotidine (PEPCID) 40 MG tablet Take 1 tablet (40 mg total) by mouth 2 (two) times daily. (Patient not taking: Reported on 05/20/2015) 30 tablet 11  . ibuprofen (ADVIL,MOTRIN) 600 MG tablet Take 1 tablet (600 mg total) by mouth every 6 (six) hours as needed (mild pain).  (Patient not taking: Reported on 05/20/2015) 30 tablet 0   No current facility-administered medications for this visit.    Review of Systems Review of Systems  Genitourinary: Positive for vaginal discharge (mild, yellow). Negative for menstrual problem and pelvic pain.    Blood pressure 112/67, pulse 70, height 5\' 5"  (1.651 m), weight 119 lb 1.6 oz (54.023 kg).  Physical Exam Physical Exam  Constitutional: She is oriented to person, place, and time. She appears well-developed. No distress.  Pulmonary/Chest: Effort normal.  Abdominal: She exhibits no distension and no mass. There is no tenderness.  Neurological: She is alert and oriented to person, place, and time.  Skin: Skin is dry.  Psychiatric: She has a normal mood and affect. Her behavior is normal.    Data Reviewed CLINICAL DATA: 49 year old female with bilateral tubo-ovarian abscess status post percutaneous drainage. Subsequent encounter. Fibroid uterus  EXAM: TRANSABDOMINAL AND TRANSVAGINAL ULTRASOUND OF PELVIS  TECHNIQUE: Both transabdominal and transvaginal ultrasound examinations of the pelvis were performed. Transabdominal technique was performed for global imaging of the pelvis including uterus, ovaries, adnexal regions, and pelvic cul-de-sac. It was necessary to proceed with endovaginal exam following the transabdominal exam to visualize the ovaries.  COMPARISON: Post drainage CT Abdomen and Pelvis 04/19/2015 and earlier.  FINDINGS: Uterus  Measurements: 10.5 x 8.2 x 9.7 cm. Numerous uterine fibroids resulting in an enlarged and lobulated uterine contour as seen on the recent CT. Individual fibroids estimated at up to 5.4 cm diameter.  Endometrium  Thickness: Not visualized.  Right ovary  Measurements: 2.9 x 2.2 x 2.1 cm. Several small follicles (image 68). Residual dilated fluid-filled tube (image 72) with diameter up to 18 mm (decreased from 40 mm on 04/05/2015). Some  floating debris/internal echoes.  Left ovary  Measurements: Not well delineated from the abnormal left tube. Estimated left ovarian size 15 x 20 mm on image 86. Thick walled but now mostly collapsed fluid-filled tube (same image). Tube diameter now 5 mm or less (versus 30 mm on 04/05/2015).  Other findings  No free fluid.  IMPRESSION: 1. Significantly regressed but not completely resolved bilateral hydrosalpinx/pyosalpinx. Residual is greater on the right (18 mm diameter) with mild fluid complexity. 2. Stable fibroid uterus. 3. No pelvic free fluid.   Electronically Signed  By: Genevie Ann M.D.  On: 05/03/2015 10:00         Hospital notes  Assessment S/P antibiotics and IR drainage of bilateral TOA with clinical improvement Postmenopausal  Fibroid uterus     Plan    Diflucan for possible sx of yeast Mammogram RTC for well woman exam 3 months         ARNOLD,JAMES 05/20/2015, 10:24 AM

## 2015-05-20 NOTE — Patient Instructions (Signed)
Mammogram A mammogram is an X-ray of the breasts that is done to check for changes that are not normal. This test can screen for and find any changes that may suggest breast cancer. This test can also help to find other changes and variations in the breast. BEFORE THE PROCEDURE  Have this test done about 1-2 weeks after your period. This is usually when your breasts are the least tender.  If you are visiting a new doctor or clinic, send any past mammogram images to your new doctor's office.  Wash your breasts and under your arms the day of the test.  Do not use deodorants, perfumes, lotions, or powders on the day of the test.  Take off any jewelry from your neck.  Wear clothes that you can change into and out of easily. PROCEDURE  You will undress from the waist up. You will put on a gown.  You will stand in front of the X-ray machine.  Each breast will be placed between two plastic or glass plates. The plates will press down on your breast for a few seconds. Try to stay as relaxed as possible. This does not cause any harm to your breasts. Any discomfort you feel will be very brief.  X-rays will be taken from different angles of each breast. The procedure may vary among doctors and hospitals.  AFTER THE PROCEDURE  The mammogram will be looked at by a specialist (radiologist).  You may need to do certain parts of the test again. This depends on the quality of the images.  Ask when your test results will be ready. Make sure you get your test results.  You may go back to your normal activities.   This information is not intended to replace advice given to you by your health care provider. Make sure you discuss any questions you have with your health care provider.   Document Released: 08/10/2008 Document Revised: 05/03/2011 Document Reviewed: 07/23/2014 Elsevier Interactive Patient Education [employer].

## 2015-06-05 ENCOUNTER — Emergency Department (HOSPITAL_COMMUNITY)
Admission: EM | Admit: 2015-06-05 | Discharge: 2015-06-05 | Disposition: A | Discharge status: Home / Self Care | Payer: Self-pay

## 2015-06-07 ENCOUNTER — Encounter (HOSPITAL_COMMUNITY): Payer: Self-pay | Admitting: *Deleted

## 2015-06-07 ENCOUNTER — Inpatient Hospital Stay (HOSPITAL_COMMUNITY)
Admission: AD | Admit: 2015-06-07 | Discharge: 2015-06-07 | Disposition: A | Discharge status: Home / Self Care | Payer: Self-pay | Source: Ambulatory Visit | Attending: Obstetrics & Gynecology | Admitting: Obstetrics & Gynecology

## 2015-06-07 DIAGNOSIS — R634 Abnormal weight loss: Secondary | ICD-10-CM | POA: Insufficient documentation

## 2015-06-07 DIAGNOSIS — N951 Menopausal and female climacteric states: Secondary | ICD-10-CM

## 2015-06-07 DIAGNOSIS — Z87891 Personal history of nicotine dependence: Secondary | ICD-10-CM | POA: Insufficient documentation

## 2015-06-07 DIAGNOSIS — N9089 Other specified noninflammatory disorders of vulva and perineum: Secondary | ICD-10-CM | POA: Insufficient documentation

## 2015-06-07 DIAGNOSIS — N959 Unspecified menopausal and perimenopausal disorder: Secondary | ICD-10-CM | POA: Insufficient documentation

## 2015-06-07 DIAGNOSIS — F1210 Cannabis abuse, uncomplicated: Secondary | ICD-10-CM | POA: Insufficient documentation

## 2015-06-07 DIAGNOSIS — N9489 Other specified conditions associated with female genital organs and menstrual cycle: Secondary | ICD-10-CM

## 2015-06-07 DIAGNOSIS — F329 Major depressive disorder, single episode, unspecified: Secondary | ICD-10-CM | POA: Insufficient documentation

## 2015-06-07 DIAGNOSIS — F419 Anxiety disorder, unspecified: Secondary | ICD-10-CM | POA: Insufficient documentation

## 2015-06-07 LAB — WET PREP, GENITAL
CLUE CELLS WET PREP: NONE SEEN
Sperm: NONE SEEN
TRICH WET PREP: NONE SEEN
YEAST WET PREP: NONE SEEN

## 2015-06-07 NOTE — Discharge Instructions (Signed)

## 2015-06-07 NOTE — MAU Provider Note (Signed)
Chief Complaint: labial swelling    First Provider Initiated Contact with Patient 06/07/15 1341      SUBJECTIVE HPI: Kerry Morris is a 50 y.o. G0P0000 who presents to maternity admissions reporting left labial swelling x 6 days.  She reports vaginal dryness recently that she attributes to age and reports she had some discomfort, itching, and burning after intercourse last Thursday. Then, the discomfort worsened and she noticed her inner labia was swollen on the left side Friday. She was unable to seek care because of the snow over the weekend so she came in today to have it evaluated. She was treated in 2016 for TOA so is concerned about infection.  She has not taken any medication for her discomfort/swelling. Pt had last vaginal bleeding in May 2016.  She denies abdominal pain, vaginal bleeding, urinary symptoms, h/a, dizziness, n/v, or fever/chills.     HPI  Past Medical History  Diagnosis Date  . Anxiety and depression   . Depression   . Anxiety    Past Surgical History  Procedure Laterality Date  . Dental surgery    . Cervical biopsy  w/ loop electrode excision     Social History   Social History  . Marital Status: Married    Spouse Name: N/A  . Number of Children: N/A  . Years of Education: N/A   Occupational History  . 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 Smoking status: Former Smoker -- 1.00 packs/day for 25 years    Types: Cigarettes    Quit date: 03/28/2015  . Smokeless tobacco: Never Used  . Alcohol Use: No     Comment: Previous h/o alcoholism, sober x 8 years  . Drug Use: Yes    Special: Marijuana     Comment: remote h/o IVDU, has been worked up for parenterally transmitted infections multiple times since  . Sexual Activity:    Partners: Male   Other Topics Concern  . Not on file   Social History Narrative   Lives with husband who is also her business partner. They own and run a bar/club. Recent financial difficulties have been a  significant source of stress for them. In addition, patient is grieving the loss of a close family member/friend in Oct 2016. As a result, [name] reports poor diet for the past several months with notable unintentional weight loss.   No current facility-administered medications on file prior to encounter.   Current Outpatient Prescriptions on File Prior to Encounter  Medication Sig Dispense Refill  . Albuterol Sulfate (PROAIR RESPICLICK IN) Inhale 123XX123 mcg into the lungs 3 (three) times daily.    . famotidine (PEPCID) 40 MG tablet Take 1 tablet (40 mg total) by mouth 2 (two) times daily. (Patient not taking: Reported on 06/07/2015) 30 tablet 11  . fluconazole (DIFLUCAN) 150 MG tablet Take 1 tablet (150 mg total) by mouth once. (Patient not taking: Reported on 06/07/2015) 1 tablet 1  . ibuprofen (ADVIL,MOTRIN) 600 MG tablet Take 1 tablet (600 mg total) by mouth every 6 (six) hours as needed (mild pain). (Patient not taking: Reported on 06/07/2015) 30 tablet 0   No Known Allergies  ROS:  Review of Systems  Constitutional: Negative for fever, chills and fatigue.  Respiratory: Negative for shortness of breath.   Cardiovascular: Negative for chest pain.  Genitourinary: Positive for vaginal discharge and vaginal pain. Negative for dysuria, flank pain, vaginal bleeding, difficulty urinating and pelvic pain.  Neurological: Negative for dizziness and  headaches.  Psychiatric/Behavioral: Negative.      I have reviewed patient's Past Medical Hx, Surgical Hx, Family Hx, Social Hx, medications and allergies.   Physical Exam  Patient Vitals for the past 24 hrs:  BP Temp Temp src Pulse Resp  06/07/15 1416 121/72 mmHg 97.4 F (36.3 C) - 62 -  06/07/15 1224 115/80 mmHg 98.2 F (36.8 C) Oral 87 18   Constitutional: Well-developed, well-nourished female in no acute distress.  Cardiovascular: normal rate Respiratory: normal effort GI: Abd soft, non-tender. Pos BS x 4 MS: Extremities nontender, no edema,  normal ROM Neurologic: Alert and oriented x 4.  GU: Neg CVAT.  PELVIC EXAM: Cervix pink, visually closed, without lesion, scant white creamy discharge, vaginal walls normal, 2 masses on RIGHT labia that are flesh colored/pale, soft, nontender and smooth, LEFT labia wnl, nontender to palpation Bimanual exam: Cervix 0/long/high, firm, anterior, neg CMT, uterus nontender, nonenlarged, adnexa without tenderness, enlargement, or mass   LAB RESULTS Results for orders placed or performed during the hospital encounter of 06/07/15 (from the past 24 hour(s))  Wet prep, genital     Status: Abnormal   Collection Time: 06/07/15  2:00 PM  Result Value Ref Range   Yeast Wet Prep HPF POC NONE SEEN NONE SEEN   Trich, Wet Prep NONE SEEN NONE SEEN   Clue Cells Wet Prep HPF POC NONE SEEN NONE SEEN   WBC, Wet Prep HPF POC FEW (A) NONE SEEN   Sperm NONE SEEN     --/--/A POS, A POS (11/09 0540)  IMAGING No results found.  MAU Management/MDM: Ordered labs and reviewed results.  Pt reports masses on right labia have been present for months but she has not had them evaluated.  She reports her labial swelling has improved before coming to MAU.  Exam today is normal except masses on right labia majora.  Pt has outpatient clinic appt with Dr Elly Modena on 06/05/15.  Plan for pt to keep this appointment to follow up.  Pt stable at time of discharge.  ASSESSMENT 1. Labial swelling   2. Perimenopause     PLAN Discharge home Pt to follow up in [facility] clinic to address labial dryness related to perimenopause Pt may do Pap in office, may postpone related to lack of insurance and attend free Pap clinic       Follow-up Information    Follow up with [facility].   Specialty:  Obstetrics and Gynecology   Why:  As scheduled, Return to MAU as needed for emergencies   Contact information:   [place] [facility] Certified  Nurse-Midwife 06/07/2015  2:42 PM

## 2015-06-07 NOTE — MAU Note (Signed)
Noted Left labia was swollen and having pain last Thurs-  But with the snow and all she didn't come in .  Had had dry sex, thought it was maybe from that.  Has been having a lot of vaginal dryness.

## 2015-06-08 LAB — RPR: RPR Ser Ql: NONREACTIVE

## 2015-06-08 LAB — GC/CHLAMYDIA PROBE AMP (~~LOC~~) NOT AT ARMC
CHLAMYDIA, DNA PROBE: NEGATIVE
NEISSERIA GONORRHEA: NEGATIVE

## 2015-06-08 LAB — HIV ANTIBODY (ROUTINE TESTING W REFLEX): HIV Screen 4th Generation wRfx: NONREACTIVE

## 2015-06-10 NOTE — Telephone Encounter (Signed)
Error

## 2015-07-06 ENCOUNTER — Encounter: Payer: Self-pay | Admitting: Obstetrics and Gynecology

## 2015-07-06 ENCOUNTER — Ambulatory Visit (INDEPENDENT_AMBULATORY_CARE_PROVIDER_SITE_OTHER): Payer: Self-pay | Admitting: Obstetrics and Gynecology

## 2015-07-06 VITALS — BP 112/78 | HR 88 | Temp 98.5°F | Ht 65.0 in | Wt 111.4 lb

## 2015-07-06 DIAGNOSIS — Z1151 Encounter for screening for human papillomavirus (HPV): Secondary | ICD-10-CM

## 2015-07-06 DIAGNOSIS — Z01419 Encounter for gynecological examination (general) (routine) without abnormal findings: Secondary | ICD-10-CM

## 2015-07-06 DIAGNOSIS — Z124 Encounter for screening for malignant neoplasm of cervix: Secondary | ICD-10-CM

## 2015-07-06 DIAGNOSIS — Z113 Encounter for screening for infections with a predominantly sexual mode of transmission: Secondary | ICD-10-CM

## 2015-07-06 NOTE — Progress Notes (Signed)
Mammogram Scholarship faxed to the [facility].

## 2015-07-06 NOTE — Progress Notes (Addendum)
  Subjective:     Kerry Morris is a 50 y.o. female G37 with BMI 18 who is here for a comprehensive physical exam. The patient reports the presence of a vaginal discharge for the past several weeks. The discharge does not have an odor. It is occasionally pruritic. Patient with recent hospitalization for bilateral TOA with bilateral drains placement. Patient denies any pelvic pain.  Past Medical History  Diagnosis Date  . Anxiety and depression   . Depression   . Anxiety    Past Surgical History  Procedure Laterality Date  . Dental surgery    . Cervical biopsy  w/ loop electrode excision     Family History  Problem Relation Age of Onset  . Leukemia Mother    Social History   Social History  . Marital Status: Married    Spouse Name: N/A  . Number of Children: N/A  . Years of Education: N/A   Occupational History  . 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 Smoking status: Former Smoker -- 1.00 packs/day for 25 years    Types: Cigarettes    Quit date: 03/28/2015  . Smokeless tobacco: Never Used  . Alcohol Use: No     Comment: Previous h/o alcoholism, sober x 8 years  . Drug Use: Yes    Special: Marijuana     Comment: remote h/o IVDU, has been worked up for parenterally transmitted infections multiple times since  . Sexual Activity:    Partners: Male   Other Topics Concern  . Not on file   Social History Narrative   Lives with husband who is also her business partner. They own and run a bar/club. Recent financial difficulties have been a significant source of stress for them. In addition, patient is grieving the loss of a close family member/friend in Oct 2016. As a result, Kerry Morris reports poor diet for the past several months with notable unintentional weight loss.   Health Maintenance  Topic Date Due  . TETANUS/TDAP  03/12/1984  . PAP SMEAR  03/12/1986  . MAMMOGRAM  03/13/2015  . COLONOSCOPY  03/13/2015  . INFLUENZA VACCINE  12/27/2015  . HIV  Screening  Completed       Review of Systems Pertinent items are noted in HPI.   Objective:      GENERAL: Well-developed, well-nourished female in no acute distress.  HEENT: Normocephalic, atraumatic. Sclerae anicteric.  NECK: Supple. Normal thyroid.  LUNGS: Clear to auscultation bilaterally.  HEART: Regular rate and rhythm. BREASTS: Symmetric in size. No palpable masses or lymphadenopathy, skin changes, or nipple drainage. ABDOMEN: Soft, nontender, nondistended. No organomegaly. PELVIC: Normal external female genitalia with 2 small growths on right labia, one measuring approximately 1 cm and the other 1.5 cm. Vagina is pink and rugated.  Normal discharge. Normal appearing cervix. Uterus is normal in size. No adnexal mass or tenderness. EXTREMITIES: No cyanosis, clubbing, or edema, 2+ distal pulses.    Assessment:    Healthy female exam.      Plan:    pap smear collected Wet prep collected Referral for screening mammogram provided Patient will be contacted with any abnormal results RTC in 1-2 weeks for excision of vulva mass, likely inclusion cysts See After Visit Summary for Counseling Recommendations

## 2015-07-07 ENCOUNTER — Telehealth: Payer: Self-pay | Admitting: General Practice

## 2015-07-07 LAB — WET PREP, GENITAL
TRICH WET PREP: NONE SEEN
WBC WET PREP: NONE SEEN
Yeast Wet Prep HPF POC: NONE SEEN

## 2015-07-07 LAB — GC/CHLAMYDIA PROBE AMP (~~LOC~~) NOT AT ARMC
Chlamydia: NEGATIVE
Neisseria Gonorrhea: NEGATIVE

## 2015-07-07 MED ORDER — METRONIDAZOLE 500 MG PO TABS: 500.0000 mg | ORAL_TABLET | Freq: Two times a day (BID) | ORAL | Status: DC

## 2015-07-07 NOTE — Telephone Encounter (Signed)
Per Dr Elly Modena, patient has BV- Rx has been sent to pharmacy. Called patient and informed her of results and explained BV to patient. Also informed patient of medication sent to pharmacy. Patient verbalized understanding to all & had no questions

## 2015-07-07 NOTE — Addendum Note (Signed)
Addended by: Mora Bellman on: 07/07/2015 01:48 PM   Modules accepted: Orders

## 2015-07-08 LAB — CYTOLOGY - PAP

## 2015-07-14 ENCOUNTER — Other Ambulatory Visit: Payer: Self-pay | Admitting: General Practice

## 2015-07-14 ENCOUNTER — Encounter: Payer: Self-pay | Admitting: Obstetrics and Gynecology

## 2015-07-14 DIAGNOSIS — B379 Candidiasis, unspecified: Secondary | ICD-10-CM

## 2015-07-14 MED ORDER — FLUCONAZOLE 150 MG PO TABS: 150.0000 mg | ORAL_TABLET | Freq: Once | ORAL | Status: DC

## 2015-07-18 ENCOUNTER — Other Ambulatory Visit (HOSPITAL_COMMUNITY)
Admission: RE | Admit: 2015-07-18 | Discharge: 2015-07-18 | Disposition: A | Discharge status: Home / Self Care | Payer: Self-pay | Source: Ambulatory Visit | Attending: Obstetrics and Gynecology | Admitting: Obstetrics and Gynecology

## 2015-07-18 ENCOUNTER — Encounter: Payer: Self-pay | Admitting: Obstetrics and Gynecology

## 2015-07-18 ENCOUNTER — Ambulatory Visit (INDEPENDENT_AMBULATORY_CARE_PROVIDER_SITE_OTHER): Payer: Self-pay | Admitting: Obstetrics and Gynecology

## 2015-07-18 VITALS — BP 111/73 | HR 87 | Temp 98.5°F | Wt 113.2 lb

## 2015-07-18 DIAGNOSIS — N907 Vulvar cyst: Secondary | ICD-10-CM | POA: Insufficient documentation

## 2015-07-18 DIAGNOSIS — N9089 Other specified noninflammatory disorders of vulva and perineum: Secondary | ICD-10-CM

## 2015-07-18 DIAGNOSIS — R1909 Other intra-abdominal and pelvic swelling, mass and lump: Secondary | ICD-10-CM

## 2015-07-18 NOTE — Progress Notes (Signed)
Patient ID: Kerry Morris, female   DOB: September 01, 1965, 50 y.o.   MRN: [id number] 50 yo G0 here for excision of 2 small vulva mass  Procedure note:  After informed consent was obtained, 1% lidocaine was injected in the skin overlying the vulva mass. A small incision was made removing both masses. The skin was reapproximated with 2.0 Vycril. The patient tolerated the procedure well. The specimens were sent to pathology. Patient will be contacted with results

## 2015-07-27 ENCOUNTER — Encounter: Payer: Self-pay | Admitting: *Deleted

## 2016-10-21 ENCOUNTER — Emergency Department (HOSPITAL_COMMUNITY): Payer: No Typology Code available for payment source

## 2016-10-21 ENCOUNTER — Encounter (HOSPITAL_COMMUNITY): Payer: Self-pay | Admitting: Nurse Practitioner

## 2016-10-21 ENCOUNTER — Emergency Department (HOSPITAL_COMMUNITY)
Admission: EM | Admit: 2016-10-21 | Discharge: 2016-10-21 | Disposition: A | Discharge status: Home / Self Care | Payer: No Typology Code available for payment source | Attending: Emergency Medicine | Admitting: Emergency Medicine

## 2016-10-21 DIAGNOSIS — Z87891 Personal history of nicotine dependence: Secondary | ICD-10-CM | POA: Diagnosis not present

## 2016-10-21 DIAGNOSIS — R198 Other specified symptoms and signs involving the digestive system and abdomen: Secondary | ICD-10-CM | POA: Diagnosis not present

## 2016-10-21 DIAGNOSIS — Z79899 Other long term (current) drug therapy: Secondary | ICD-10-CM | POA: Insufficient documentation

## 2016-10-21 DIAGNOSIS — D259 Leiomyoma of uterus, unspecified: Secondary | ICD-10-CM | POA: Insufficient documentation

## 2016-10-21 DIAGNOSIS — R109 Unspecified abdominal pain: Secondary | ICD-10-CM | POA: Diagnosis present

## 2016-10-21 LAB — COMPREHENSIVE METABOLIC PANEL
ALBUMIN: 4.4 g/dL (ref 3.5–5.0)
ALT: 20 U/L (ref 14–54)
ANION GAP: 9 (ref 5–15)
AST: 30 U/L (ref 15–41)
Alkaline Phosphatase: 69 U/L (ref 38–126)
BILIRUBIN TOTAL: 0.6 mg/dL (ref 0.3–1.2)
BUN: 9 mg/dL (ref 6–20)
CO2: 25 mmol/L (ref 22–32)
Calcium: 9.4 mg/dL (ref 8.9–10.3)
Chloride: 104 mmol/L (ref 101–111)
Creatinine, Ser: 0.7 mg/dL (ref 0.44–1.00)
GFR calc Af Amer: >60 mL/min (ref >60)
GFR calc non Af Amer: >60 mL/min (ref >60)
GLUCOSE: 101 mg/dL — AB (ref 65–99)
POTASSIUM: 4.1 mmol/L (ref 3.5–5.1)
SODIUM: 138 mmol/L (ref 135–145)
TOTAL PROTEIN: 7.9 g/dL (ref 6.5–8.1)

## 2016-10-21 LAB — CBC
HEMATOCRIT: 45 % (ref 36.0–46.0)
HEMOGLOBIN: 15.8 g/dL — AB (ref 12.0–15.0)
MCH: 32.2 pg (ref 26.0–34.0)
MCHC: 35.1 g/dL (ref 30.0–36.0)
MCV: 91.6 fL (ref 78.0–100.0)
Platelets: 280 10*3/uL (ref 150–400)
RBC: 4.91 MIL/uL (ref 3.87–5.11)
RDW: 13.8 % (ref 11.5–15.5)
WBC: 6.8 10*3/uL (ref 4.0–10.5)

## 2016-10-21 LAB — LIPASE, BLOOD: LIPASE: 32 U/L (ref 11–51)

## 2016-10-21 NOTE — ED Triage Notes (Signed)
Pt is c/o lower quadrants abdominal pain. States she has not has a BM for the last 3 days, typically has one daily. Remote hyperactive bowel sounds ausculted. Mild toutness noticed on the lower quadrants.

## 2016-10-21 NOTE — ED Provider Notes (Signed)
[facility] DEPT Provider Note   CSN: [id number] Arrival date & time: 10/21/16  1657     History   Chief Complaint Chief Complaint  Patient presents with  . Abdominal Pain  . Constipation    HPI Kerry Morris is a 51 y.o. female.  The history is provided by the patient.   51 year old with a history of anxiety, depression, uterine fibroids presents the ED with several months of abdominal discomfort with 3 days of inability to have a bowel movement. She endorses tenesmus but not being able to pass stool. She denies any overt abdominal pain. Denies any nausea, vomiting. Denies any dysuria, vaginal bleeding, or discharge. Denies any other physical complaints.   Past Medical History:  Diagnosis Date  . Anxiety   . Anxiety and depression   . Depression     Patient Active Problem List   Diagnosis Date Noted  . Uterine fibroid 04/26/2015  . Tubo-ovarian abscess   . TOA (tubo-ovarian abscess) 04/06/2015    Past Surgical History:  Procedure Laterality Date  . CERVICAL BIOPSY  W/ LOOP ELECTRODE EXCISION    . DENTAL SURGERY      OB History    Gravida Para Term Preterm AB Living   0 0 0 0 0 0   SAB TAB Ectopic Multiple Live Births   0 0 0 0         Home Medications    Prior to Admission medications   Medication Sig Start Date End Date Taking? Authorizing Provider  Albuterol Sulfate (PROAIR RESPICLICK IN) Inhale 914 mcg into the lungs 3 (three) times daily.    [provider]  fluconazole (DIFLUCAN) 150 MG tablet Take 1 tablet (150 mg total) by mouth once. Patient not taking: Reported on 07/18/2015 07/14/15   Woodroe Mode, MD  metroNIDAZOLE (FLAGYL) 500 MG tablet Take 1 tablet (500 mg total) by mouth 2 (two) times daily. Patient not taking: Reported on 07/18/2015 07/07/15   Constant, Peggy, MD    Family History Family History  Problem Relation Age of Onset  . Leukemia Mother     Social History Social History  Substance Use Topics  . Smoking status:  Former Smoker    Packs/day: 1.00    Years: 25.00    Types: Cigarettes    Quit date: 03/28/2015  . Smokeless tobacco: Never Used  . Alcohol use No     Comment: Previous h/o alcoholism, sober x 8 years     Allergies   Patient has no known allergies.   Review of Systems Review of Systems All other systems are reviewed and are negative for acute change except as noted in the HPI   Physical Exam Updated Vital Signs BP (!) 131/95 (BP Location: Right Arm)   Pulse 85   Temp 98.2 F (36.8 C) (Oral)   Resp 16   SpO2 98%   Physical Exam  Constitutional: She is oriented to person, place, and time. She appears well-developed and well-nourished. No distress.  HENT:  Head: Normocephalic and atraumatic.  Nose: Nose normal.  Eyes: Conjunctivae and EOM are normal. Pupils are equal, round, and reactive to light. Right eye exhibits no discharge. Left eye exhibits no discharge. No scleral icterus.  Neck: Normal range of motion. Neck supple.  Cardiovascular: Normal rate and regular rhythm.  Exam reveals no gallop and no friction rub.   No murmur heard. Pulmonary/Chest: Effort normal and breath sounds normal. No stridor. No respiratory distress. She has no rales.  Abdominal: Soft. She exhibits  no distension. There is no tenderness. There is no rigidity, no rebound and no guarding. A hernia is present. Hernia confirmed positive in the ventral area (Nontender, reducible).  Genitourinary:  Genitourinary Comments: DRE without stool in the rectal vault. Palpable mass noted on the anterior aspect.  Musculoskeletal: She exhibits no edema or tenderness.  Neurological: She is alert and oriented to person, place, and time.  Skin: Skin is warm and dry. No rash noted. She is not diaphoretic. No erythema.  Psychiatric: She has a normal mood and affect.  Vitals reviewed.    ED Treatments / Results  Labs (all labs ordered are listed, but only abnormal results are displayed) Labs Reviewed    COMPREHENSIVE METABOLIC PANEL - Abnormal; Notable for the following:       Result Value   Glucose, Bld 101 (*)    All other components within normal limits  CBC - Abnormal; Notable for the following:    Hemoglobin 15.8 (*)    All other components within normal limits  LIPASE, BLOOD    EKG  EKG Interpretation None       Radiology Dg Abdomen 1 View  Result Date: 10/21/2016 CLINICAL DATA:  Lower abdominal pain.  No bowel movement for 3 days. EXAM: ABDOMEN - 1 VIEW COMPARISON:  Pelvic ultrasound 05/03/2015. Pelvic CT 04/05/2015 and 04/19/2015. FINDINGS: The bowel gas pattern is normal. There is no supine evidence of free intraperitoneal air. Calcified uterine fibroid projects over the sacrum. There is increased density within the pelvis which is nonspecific. Previous studies have demonstrated multiple uterine fibroids and bilateral hydrosalpinges (the latter previously drained). IMPRESSION: 1. No acute findings. 2. Persistent increased density in the pelvis, likely related to known uterine fibroids. This could be further evaluated with repeat CT or ultrasound as clinically warranted. Electronically Signed   By: Richardean Sale M.D.   On: 10/21/2016 20:22    Procedures Procedures (including critical care time)  Medications Ordered in ED Medications - No data to display   Initial Impression / Assessment and Plan / ED Course  I have reviewed the triage vital signs and the nursing notes.  Pertinent labs & imaging results that were available during my care of the patient were reviewed by me and considered in my medical decision making (see chart for details).     On review of patient's records and noted a CT from 2006 that noted a posterior uterine fibroid measuring 4.4 cm. This is likely the mass I palpated during the DRE. I believe that this is obstructing the rectum making it difficult for the patient to have a bowel movement. Labs obtained were grossly reassuring. KUB was obtained  which did not reveal any evidence of dilated loops of bowel, that would be concerning for obstruction. Abdominal exam was nontender and benign. I recommended patient follow-up with gynecology for further evaluation and management of her uterine fibroids. Also recommended daily MiraLAX in order to soften stools so that they can pass through the affected region.  The patient is safe for discharge with strict return precautions.   Final Clinical Impressions(s) / ED Diagnoses   Final diagnoses:  Uterine leiomyoma, unspecified location  Tenesmus (rectal)   Disposition: Discharge  Condition: Good  I have discussed the results, Dx and Tx plan with the patient who expressed understanding and agree(s) with the plan. Discharge instructions discussed at great length. The patient was given strict return precautions who verbalized understanding of the instructions. No further questions at time of discharge.  Discharge Medication List as of 10/21/2016  8:39 PM      Follow Up: Gynecologist   To discuss further management of uterine fibroids      Cardama, Grayce Sessions, MD 10/22/16 [phone]

## 2016-10-22 ENCOUNTER — Encounter (HOSPITAL_COMMUNITY): Payer: Self-pay | Admitting: *Deleted

## 2016-10-22 ENCOUNTER — Emergency Department (HOSPITAL_COMMUNITY)
Admission: EM | Admit: 2016-10-22 | Discharge: 2016-10-23 | Disposition: A | Discharge status: Home / Self Care | Payer: No Typology Code available for payment source | Attending: Emergency Medicine | Admitting: Emergency Medicine

## 2016-10-22 ENCOUNTER — Emergency Department (HOSPITAL_COMMUNITY): Payer: No Typology Code available for payment source

## 2016-10-22 DIAGNOSIS — Z87891 Personal history of nicotine dependence: Secondary | ICD-10-CM | POA: Diagnosis not present

## 2016-10-22 DIAGNOSIS — R1907 Generalized intra-abdominal and pelvic swelling, mass and lump: Secondary | ICD-10-CM | POA: Diagnosis not present

## 2016-10-22 DIAGNOSIS — Z79899 Other long term (current) drug therapy: Secondary | ICD-10-CM | POA: Diagnosis not present

## 2016-10-22 DIAGNOSIS — R112 Nausea with vomiting, unspecified: Secondary | ICD-10-CM

## 2016-10-22 DIAGNOSIS — R109 Unspecified abdominal pain: Secondary | ICD-10-CM | POA: Diagnosis present

## 2016-10-22 DIAGNOSIS — R1900 Intra-abdominal and pelvic swelling, mass and lump, unspecified site: Secondary | ICD-10-CM

## 2016-10-22 LAB — CBC WITH DIFFERENTIAL/PLATELET
BASOS ABS: 0 10*3/uL (ref 0.0–0.1)
BASOS PCT: 0 %
EOS ABS: 0 10*3/uL (ref 0.0–0.7)
Eosinophils Relative: 0 %
HCT: 43.3 % (ref 36.0–46.0)
Hemoglobin: 14.9 g/dL (ref 12.0–15.0)
LYMPHS ABS: 1.9 10*3/uL (ref 0.7–4.0)
Lymphocytes Relative: 26 %
MCH: 31.2 pg (ref 26.0–34.0)
MCHC: 34.4 g/dL (ref 30.0–36.0)
MCV: 90.6 fL (ref 78.0–100.0)
Monocytes Absolute: 0.4 10*3/uL (ref 0.1–1.0)
Monocytes Relative: 6 %
NEUTROS PCT: 68 %
Neutro Abs: 5.1 10*3/uL (ref 1.7–7.7)
PLATELETS: 292 10*3/uL (ref 150–400)
RBC: 4.78 MIL/uL (ref 3.87–5.11)
RDW: 13.6 % (ref 11.5–15.5)
WBC: 7.4 10*3/uL (ref 4.0–10.5)

## 2016-10-22 LAB — COMPREHENSIVE METABOLIC PANEL
ALBUMIN: 3.9 g/dL (ref 3.5–5.0)
ALT: 24 U/L (ref 14–54)
AST: 56 U/L — ABNORMAL HIGH (ref 15–41)
Alkaline Phosphatase: 55 U/L (ref 38–126)
Anion gap: 9 (ref 5–15)
BUN: 12 mg/dL (ref 6–20)
CHLORIDE: 101 mmol/L (ref 101–111)
CO2: 26 mmol/L (ref 22–32)
Calcium: 8.8 mg/dL — ABNORMAL LOW (ref 8.9–10.3)
Creatinine, Ser: 0.6 mg/dL (ref 0.44–1.00)
GFR calc non Af Amer: >60 mL/min (ref >60)
GLUCOSE: 106 mg/dL — AB (ref 65–99)
Potassium: 5.7 mmol/L — ABNORMAL HIGH (ref 3.5–5.1)
SODIUM: 136 mmol/L (ref 135–145)
Total Bilirubin: 1.2 mg/dL (ref 0.3–1.2)
Total Protein: 6.5 g/dL (ref 6.5–8.1)

## 2016-10-22 LAB — I-STAT CHEM 8, ED
BUN: 16 mg/dL (ref 6–20)
CALCIUM ION: 1.07 mmol/L — AB (ref 1.15–1.40)
Chloride: 102 mmol/L (ref 101–111)
Creatinine, Ser: 0.5 mg/dL (ref 0.44–1.00)
Glucose, Bld: 117 mg/dL — ABNORMAL HIGH (ref 65–99)
HCT: 49 % — ABNORMAL HIGH (ref 36.0–46.0)
HEMOGLOBIN: 16.7 g/dL — AB (ref 12.0–15.0)
Potassium: 4.4 mmol/L (ref 3.5–5.1)
SODIUM: 137 mmol/L (ref 135–145)
TCO2: 28 mmol/L (ref 0–100)

## 2016-10-22 LAB — WET PREP, GENITAL
SPERM: NONE SEEN
Trich, Wet Prep: NONE SEEN
YEAST WET PREP: NONE SEEN

## 2016-10-22 LAB — LIPASE, BLOOD: Lipase: 98 U/L — ABNORMAL HIGH (ref 11–51)

## 2016-10-22 MED ORDER — ONDANSETRON 4 MG PO TBDP
ORAL_TABLET | ORAL | Status: AC
  Filled 2016-10-22: qty 1

## 2016-10-22 MED ORDER — ONDANSETRON 4 MG PO TBDP
4.0000 mg | ORAL_TABLET | Freq: Once | ORAL | Status: AC
  Administered 2016-10-22: 4 mg via ORAL

## 2016-10-22 MED ORDER — IOPAMIDOL (ISOVUE-300) INJECTION 61%
INTRAVENOUS | Status: AC
  Administered 2016-10-22: 100 mL
  Filled 2016-10-22: qty 100

## 2016-10-22 MED ORDER — OXYCODONE-ACETAMINOPHEN 5-325 MG PO TABS: 1.0000 | ORAL_TABLET | ORAL | 0 refills | Status: DC | PRN

## 2016-10-22 MED ORDER — SODIUM CHLORIDE 0.9 % IV BOLUS (SEPSIS)
1000.0000 mL | Freq: Once | INTRAVENOUS | Status: AC
  Administered 2016-10-22: 1000 mL via INTRAVENOUS

## 2016-10-22 MED ORDER — PROMETHAZINE HCL 25 MG PO TABS: 25.0000 mg | ORAL_TABLET | Freq: Four times a day (QID) | ORAL | 0 refills | Status: DC | PRN

## 2016-10-22 MED ORDER — PROMETHAZINE HCL 25 MG/ML IJ SOLN
25.0000 mg | Freq: Once | INTRAMUSCULAR | Status: AC
  Administered 2016-10-22: 25 mg via INTRAVENOUS
  Filled 2016-10-22: qty 1

## 2016-10-22 MED ORDER — MORPHINE SULFATE (PF) 4 MG/ML IV SOLN
4.0000 mg | Freq: Once | INTRAVENOUS | Status: AC
  Administered 2016-10-22: 4 mg via INTRAVENOUS
  Filled 2016-10-22: qty 1

## 2016-10-22 NOTE — ED Provider Notes (Signed)
[facility] DEPT Provider Note   CSN: [id number] Arrival date & time: 10/22/16  1517     History   Chief Complaint Chief Complaint  Patient presents with  . Abdominal Pain  . Constipation    HPI Kerry Morris is a 51 y.o. female.  HPI Patient presents with vomiting for the past day. States she's been unable to take fluid or solid intake. States the vomit is yellow in color. Also complains of 3 days of constipation. Intermittently passing gas. Complains of abdominal fullness. Denies vaginal discharge or bleeding. No fever or chills. Patient was seen 2 years ago and had tubo-ovarian abscesses that require IR drainage. Past Medical History:  Diagnosis Date  . Anxiety   . Anxiety and depression   . Depression     Patient Active Problem List   Diagnosis Date Noted  . Uterine fibroid 04/26/2015  . Tubo-ovarian abscess   . TOA (tubo-ovarian abscess) 04/06/2015    Past Surgical History:  Procedure Laterality Date  . CERVICAL BIOPSY  W/ LOOP ELECTRODE EXCISION    . DENTAL SURGERY      OB History    Gravida Para Term Preterm AB Living   0 0 0 0 0 0   SAB TAB Ectopic Multiple Live Births   0 0 0 0         Home Medications    Prior to Admission medications   Medication Sig Start Date End Date Taking? Authorizing Provider  Albuterol Sulfate (PROAIR RESPICLICK IN) Inhale 660 mcg into the lungs 3 (three) times daily as needed (shortness of breath).    Yes [provider]  ALPRAZolam Duanne Moron) 0.5 MG tablet Take 0.5 mg by mouth 3 (three) times daily as needed for anxiety.   Yes [provider]  gabapentin (NEURONTIN) 100 MG capsule Take 500 mg by mouth 2 (two) times daily.   Yes [provider]  meloxicam (MOBIC) 15 MG tablet Take 15 mg by mouth daily as needed for pain.   Yes [provider]  oxyCODONE-acetaminophen (PERCOCET) 5-325 MG tablet Take 1-2 tablets by mouth every 4 (four) hours as needed for severe pain. 10/22/16   Julianne Rice, MD  promethazine (PHENERGAN) 25 MG tablet Take 1 tablet (25 mg total) by mouth every 6 (six) hours as needed for nausea or vomiting. 10/22/16   Julianne Rice, MD    Family History Family History  Problem Relation Age of Onset  . Leukemia Mother     Social History Social History  Substance Use Topics  . Smoking status: Former Smoker    Packs/day: 1.00    Years: 25.00    Types: Cigarettes    Quit date: 03/28/2015  . Smokeless tobacco: Never Used  . Alcohol use No     Comment: Previous h/o alcoholism, sober x 8 years     Allergies   Patient has no known allergies.   Review of Systems Review of Systems  Constitutional: Positive for appetite change and fatigue. Negative for chills and fever.  Respiratory: Negative for shortness of breath.   Cardiovascular: Negative for chest pain, palpitations and leg swelling.  Gastrointestinal: Positive for abdominal distention, abdominal pain, constipation, nausea and vomiting. Negative for blood in stool and diarrhea.  Genitourinary: Negative for dysuria, flank pain, frequency, hematuria, vaginal bleeding and vaginal discharge.  Musculoskeletal: Negative for back pain, myalgias, neck pain and neck stiffness.  Skin: Negative for rash and wound.  Neurological: Negative for dizziness, weakness, light-headedness, numbness and headaches.  All other systems  reviewed and are negative.    Physical Exam Updated Vital Signs BP 130/80   Pulse 78   Temp 97.9 F (36.6 C)   Resp 18   Ht 5\' 5"  (1.651 m)   Wt 50.8 kg (112 lb)   SpO2 97%   BMI 18.64 kg/m   Physical Exam  Constitutional: She is oriented to person, place, and time. She appears well-developed and well-nourished. No distress.  HENT:  Head: Normocephalic and atraumatic.  Mouth/Throat: Oropharynx is clear and moist. No oropharyngeal exudate.  Eyes: EOM are normal. Pupils are equal, round, and reactive to light.  Neck: Normal range of motion. Neck supple.    Cardiovascular: Normal rate and regular rhythm.  Exam reveals no gallop and no friction rub.   No murmur heard. Pulmonary/Chest: Effort normal and breath sounds normal.  Abdominal: Soft. Bowel sounds are normal. She exhibits distension. There is tenderness. There is no rebound and no guarding.  Patient with pelvic fullness. Mild tenderness to palpation without focality. No rebound or guarding.  Genitourinary:  Genitourinary Comments: Scant white vaginal discharge. No definite cervical motion tenderness. Patient with fundal adnexal fullness bilaterally. Mildly tender to palpation.  Musculoskeletal: Normal range of motion. She exhibits no edema or tenderness.  No CVA tenderness bilaterally.  Neurological: She is alert and oriented to person, place, and time.  Moves all extremities without focal deficit. Sensation fully intact.  Skin: Skin is warm and dry. Capillary refill takes less than 2 seconds. No rash noted. No erythema.  Psychiatric: Her behavior is normal.  Anxious appearing  Nursing note and vitals reviewed.    ED Treatments / Results  Labs (all labs ordered are listed, but only abnormal results are displayed) Labs Reviewed  WET PREP, GENITAL - Abnormal; Notable for the following:       Result Value   Clue Cells Wet Prep HPF POC PRESENT (*)    WBC, Wet Prep HPF POC FEW (*)    All other components within normal limits  COMPREHENSIVE METABOLIC PANEL - Abnormal; Notable for the following:    Potassium 5.7 (*)    Glucose, Bld 106 (*)    Calcium 8.8 (*)    AST 56 (*)    All other components within normal limits  LIPASE, BLOOD - Abnormal; Notable for the following:    Lipase 98 (*)    All other components within normal limits  I-STAT CHEM 8, ED - Abnormal; Notable for the following:    Glucose, Bld 117 (*)    Calcium, Ion 1.07 (*)    Hemoglobin 16.7 (*)    HCT 49.0 (*)    All other components within normal limits  CBC WITH DIFFERENTIAL/PLATELET  URINALYSIS, ROUTINE W  REFLEX MICROSCOPIC  CA 125  CEA  CANCER ANTIGEN 19-9  GC/CHLAMYDIA PROBE AMP () NOT AT [facility]    EKG  EKG Interpretation None       Radiology Dg Abdomen 1 View  Result Date: 10/21/2016 CLINICAL DATA:  Lower abdominal pain.  No bowel movement for 3 days. EXAM: ABDOMEN - 1 VIEW COMPARISON:  Pelvic ultrasound 05/03/2015. Pelvic CT 04/05/2015 and 04/19/2015. FINDINGS: The bowel gas pattern is normal. There is no supine evidence of free intraperitoneal air. Calcified uterine fibroid projects over the sacrum. There is increased density within the pelvis which is nonspecific. Previous studies have demonstrated multiple uterine fibroids and bilateral hydrosalpinges (the latter previously drained). IMPRESSION: 1. No acute findings. 2. Persistent increased density in the pelvis, likely related to known uterine  fibroids. This could be further evaluated with repeat CT or ultrasound as clinically warranted. Electronically Signed   By: Richardean Sale M.D.   On: 10/21/2016 20:22   Ct Abdomen Pelvis W Contrast  Result Date: 10/22/2016 CLINICAL DATA:  Acute onset of lower pelvic pain, constipation, nausea and vomiting. Initial encounter. EXAM: CT ABDOMEN AND PELVIS WITH CONTRAST TECHNIQUE: Multidetector CT imaging of the abdomen and pelvis was performed using the standard protocol following bolus administration of intravenous contrast. CONTRAST:  165mL ISOVUE-300 IOPAMIDOL (ISOVUE-300) INJECTION 61% COMPARISON:  Abdominal radiograph performed 10/21/2016, and CT of the abdomen and pelvis from 04/19/2015 FINDINGS: Lower chest: The visualized lung bases are grossly clear. The visualized portions of the mediastinum are unremarkable. Hepatobiliary: A 1.6 cm cyst is noted at the medial right hepatic lobe. The liver is otherwise unremarkable. The gallbladder is grossly unremarkable in appearance. The common bile duct remains normal in caliber. Pancreas: The pancreas is within normal limits. Spleen: The spleen  is unremarkable in appearance. Adrenals/Urinary Tract: The adrenal glands are unremarkable in appearance. The kidneys are within normal limits. There is no evidence of hydronephrosis. No renal or ureteral stones are identified. No perinephric stranding is seen. Stomach/Bowel: The appendix is normal in caliber. The colon is grossly unremarkable in appearance. Visualized small bowel loops are grossly unremarkable in appearance. The stomach is grossly unremarkable. Vascular/Lymphatic: Minimal calcification is noted along the aortic bifurcation. The inferior vena cava is grossly unremarkable. Mildly prominent retroperitoneal nodes are seen. Enlarged bilateral pelvic sidewall nodes measure up to 1.6 cm in short axis. Diffuse peritoneal carcinomatosis is noted along the mid to lower abdomen, new from 2016. Mildly prominent nodes are noted about the left inguinal vessels. Reproductive: Numerous uterine fibroids are again noted, some of which have increased in size. Underlying malignancy cannot be excluded. Dilated bilateral fluid collections are again noted within the pelvis, the largest of which measures 15 cm in length. There is a complex partially cystic mass at the left adnexa, measuring 5.9 x 5.5 x 4.3 cm. This may reflect a primary ovarian malignancy. Other: Small volume ascites is noted within the abdomen and pelvis. Musculoskeletal: No acute osseous abnormalities are identified. The visualized musculature is unremarkable in appearance. IMPRESSION: 1. 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 2. Complex partially cystic mass at the left adnexa, measuring 5.9 x 5.5 x 4.3 cm. This raises concern for primary ovarian malignancy. PET/CT could be considered for further evaluation. 3. Numerous uterine fibroids again noted, some which have increased in size. Underlying endometrial malignancy cannot be excluded. 4. Dilated bilateral fluid  collections have reaccumulated within the pelvis, the largest of which measures 15 cm in length. 5. Enlarged bilateral pelvic sidewall nodes measure up to 1.6 cm in short axis, compatible with metastatic disease. Mildly prominent retroperitoneal nodes seen. Mildly prominent nodes about the left inguinal vessels. 6. 1.6 cm cyst at the medial right hepatic lobe. Electronically Signed   By: Garald Balding M.D.   On: 10/22/2016 21:26    Procedures Procedures (including critical care time)  Medications Ordered in ED Medications  ondansetron (ZOFRAN-ODT) 4 MG disintegrating tablet (not administered)  ondansetron (ZOFRAN-ODT) disintegrating tablet 4 mg (4 mg Oral Given 10/22/16 1610)  sodium chloride 0.9 % bolus 1,000 mL (1,000 mLs Intravenous New Bag/Given 10/22/16 1943)  iopamidol (ISOVUE-300) 61 % injection (100 mLs  Contrast Given 10/22/16 2018)  promethazine (PHENERGAN) injection 25 mg (25 mg Intravenous Given 10/22/16 2152)  sodium  chloride 0.9 % bolus 1,000 mL (1,000 mLs Intravenous New Bag/Given 10/22/16 2154)  morphine 4 MG/ML injection 4 mg (4 mg Intravenous Given 10/22/16 2215)     Initial Impression / Assessment and Plan / ED Course  I have reviewed the triage vital signs and the nursing notes.  Pertinent labs & imaging results that were available during my care of the patient were reviewed by me and considered in my medical decision making (see chart for details).    Discuss CT results with Dr. Ilda Basset. We'll arrange to have patient followed up closely as an outpatient. Patient is still dry heaving despite 2 doses of Zofran. Will try giving Phenergan. The patient is unable tolerate oral intake and she will need to be admitted.  Pain and nausea are controlled. Patient is able take sips of water without vomiting. Discharge home with pain medication and nausea medication. Will follow up with gynecology as outpatient. Return precautions have been given. Final Clinical Impressions(s) / ED  Diagnoses   Final diagnoses:  Non-intractable vomiting with nausea, unspecified vomiting type  Pelvic mass in female    New Prescriptions New Prescriptions   OXYCODONE-ACETAMINOPHEN (PERCOCET) 5-325 MG TABLET    Take 1-2 tablets by mouth every 4 (four) hours as needed for severe pain.   PROMETHAZINE (PHENERGAN) 25 MG TABLET    Take 1 tablet (25 mg total) by mouth every 6 (six) hours as needed for nausea or vomiting.     Julianne Rice, MD 10/23/16 [phone]

## 2016-10-22 NOTE — ED Notes (Signed)
Pt c/o abd pain and unable to keep anything down-- pt having dry heaves at present-- was seen at [facility] for same yesterday.

## 2016-10-22 NOTE — ED Triage Notes (Signed)
Pt states constipation x 3 days.  Took miralax last night, but vomited it up.  Was seen at [facility] last night, but continues to have abdominal pain and nausea.

## 2016-10-23 ENCOUNTER — Other Ambulatory Visit: Payer: Self-pay | Admitting: General Practice

## 2016-10-23 ENCOUNTER — Telehealth: Payer: Self-pay | Admitting: General Practice

## 2016-10-23 DIAGNOSIS — C569 Malignant neoplasm of unspecified ovary: Secondary | ICD-10-CM

## 2016-10-23 LAB — GC/CHLAMYDIA PROBE AMP (~~LOC~~) NOT AT ARMC
Chlamydia: NEGATIVE
Neisseria Gonorrhea: NEGATIVE

## 2016-10-23 MED ORDER — PROMETHAZINE HCL 25 MG/ML IJ SOLN
25.0000 mg | Freq: Once | INTRAMUSCULAR | Status: AC
  Administered 2016-10-23: 25 mg via INTRAMUSCULAR
  Filled 2016-10-23: qty 1

## 2016-10-23 NOTE — Telephone Encounter (Signed)
Per Dr Ilda Basset, patient needs referral to GYN/ONC given recent ER visit and CT scan findings. Scheduled for 6/1 @ 1145am. Called patient at home/mobile number which went to voicemail for local club. Called work number and phone just kept [employer]. Will send mychart message

## 2016-10-26 ENCOUNTER — Telehealth: Payer: Self-pay | Admitting: *Deleted

## 2016-10-26 ENCOUNTER — Ambulatory Visit: Payer: No Typology Code available for payment source | Attending: Gynecology | Admitting: Gynecology

## 2016-10-26 ENCOUNTER — Encounter: Payer: Self-pay | Admitting: Obstetrics and Gynecology

## 2016-10-26 VITALS — BP 155/85 | HR 82 | Temp 97.9°F | Resp 22 | Ht 65.0 in | Wt 111.5 lb

## 2016-10-26 DIAGNOSIS — R188 Other ascites: Secondary | ICD-10-CM

## 2016-10-26 DIAGNOSIS — K5900 Constipation, unspecified: Secondary | ICD-10-CM

## 2016-10-26 DIAGNOSIS — D259 Leiomyoma of uterus, unspecified: Secondary | ICD-10-CM | POA: Diagnosis not present

## 2016-10-26 DIAGNOSIS — Z79899 Other long term (current) drug therapy: Secondary | ICD-10-CM | POA: Insufficient documentation

## 2016-10-26 DIAGNOSIS — Z9889 Other specified postprocedural states: Secondary | ICD-10-CM | POA: Insufficient documentation

## 2016-10-26 DIAGNOSIS — F419 Anxiety disorder, unspecified: Secondary | ICD-10-CM | POA: Insufficient documentation

## 2016-10-26 DIAGNOSIS — Z79891 Long term (current) use of opiate analgesic: Secondary | ICD-10-CM | POA: Insufficient documentation

## 2016-10-26 DIAGNOSIS — R1900 Intra-abdominal and pelvic swelling, mass and lump, unspecified site: Secondary | ICD-10-CM | POA: Diagnosis not present

## 2016-10-26 DIAGNOSIS — Z87891 Personal history of nicotine dependence: Secondary | ICD-10-CM | POA: Insufficient documentation

## 2016-10-26 DIAGNOSIS — F329 Major depressive disorder, single episode, unspecified: Secondary | ICD-10-CM | POA: Insufficient documentation

## 2016-10-26 NOTE — Patient Instructions (Signed)
Plan for surgery at [facility] on June 5 with Dr. Nancy Marus.  Arrive at 12 noon to [facility] the day before surgery unless you hear otherwise from [facility].

## 2016-10-26 NOTE — Progress Notes (Signed)
Consult Note: Gyn-Onc   Kerry Morris 51 y.o. female  Chief Complaint  Patient presents with  . pelvic Mass    Assessment :Complex pelvic mass, ascites, apparent carcinomatosis, and uterine fibroids most likely representing advanced ovarian cancer. Given the patient's severe pain and significant constipation I would recommend she undergo exploratory laparotomy and debulking for diagnosis, and palliation of symptoms secondary to this large mass. (despite the fact that I doubt she couldn't be rendered are 0).  Plan: I reviewed the patient's CT scan findings and physical exam with the patient and her husband. I indicated that it appeared she had advanced ovarian cancer. Given the patient's severe pain and significant constipation of recommended that she undergo exploratory laparotomy and tumor debulking for purposes of palliation, diagnosis, and initiation of treatment. The patient would like to proceed with surgery as soon as possible and we'll therefore schedule her to be operated on at [facility] on June 5. Given her symptoms we will schedule her to be admitted on June 4. The extent of the surgery was described in detail and many questions were answered. Patient understands that she will undergo a total abdominal hysterectomy bilateral salpingo-oophorectomy and tumor debulking which might include resection of small or large bowel. The risks of major surgery including hemorrhage infection injury to adjacent viscera thrombolic, occasional anesthetic risks were all detailed. I clarified for the patient's husband why a supracervical hysterectomy would not be appropriate. I also indicated the midline incision would not result in cutting any muscle. They're aware that if in fact this is ovarian cancer she will need chemotherapy postoperatively. The usual postoperative course and milestones for recovery were outlined to the patient and her husband. Over the weekend she will use fleets enemas in hopes of resolving her  constipation.  HPI: 51 year old white married female seen in consultation request of Dr. Atlee Abide regarding management of apparent advanced ovarian cancer. Patient presented to the emergency room with abdominal pain and constipation on 10/22/2016. Evaluation at that time including a CT scan of the abdomen and pelvis revealing diffuse peritoneal carcinomatosis is not present on a CT scan in 2016. In addition she has a complex partially cystic left adnexal mass measuring 5.9 x 5.5 x 4.3 cm. The patient has known uterine fibroids which are demonstrated again on CT scan. There are dilated bilateral fluid collections in the pelvis the largest of which is 15 cm as well as enlarged bilateral pelvic sidewall lymph nodes measuring 1.6 cm. Currently the patient has moderate to severe abdominal pain and has not had a bowel movement in 6 days despite using a number of different laxatives and on mineral oil enema. Her appetite is diminished although she's not had any vomiting. Her pain is predominantly in the lower abdomen and pelvis.  Patient has a history in 2016 what she describes as pelvic inflammatory disease with apparent tubo-ovarian abscesses which were drained by interventional radiology. Review of the medical record indicated the patient did have an elevated white count. Cultures only grew out diphtheroids. No cytology was sent from these fluid collections.  The patient has no other gynecologic history. Her last menstrual period was in March 2016. She is not had any bleeding since. She is not on hormone replacement therapy. She has no family history of breast or ovarian cancer.  Review of Systems:10 point review of systems is negative except as noted in interval history.   Vitals: Blood pressure (!) 155/85, pulse 82, temperature 97.9 F (36.6 C), resp. rate (!) 22, height  5\' 5"  (1.651 m), weight 111 lb 8 oz (50.6 kg).  Physical Exam: General : The patient is a healthy woman who is in moderate to  severe pain and very anxious.  HEENT: normocephalic, extraoccular movements normal; neck is supple without thyromegally  Lynphnodes: Supraclavicular and inguinal nodes not enlarged  Abdomen: Slightly distended. On Monday will to detect a fluid wave. There is a palpable tender mass in the suprapubic region.  Pelvic:  EGBUS: Normal female  Vagina: Normal, no lesions  Urethra and Bladder: Normal, non-tender  Cervix: Deviated anteriorly secondary to a mass posterior to the uterus and cervix. Uterus: It is impossible to distinguish uterus from the remainder of a pelvic mass which fills the pelvis and is moderately tender. An aggregate seems a masses proximally 16 weeks size.  Bi-manual examination:    Rectal: normal sphincter tone, no masses, no blood, there is no stool in the ampulla. Lower extremities: No edema or varicosities. Normal range of motion      No Known Allergies  Past Medical History:  Diagnosis Date  . Anxiety   . Anxiety and depression   . Depression     Past Surgical History:  Procedure Laterality Date  . CERVICAL BIOPSY  W/ LOOP ELECTRODE EXCISION    . DENTAL SURGERY      Current Outpatient Prescriptions  Medication Sig Dispense Refill  . Albuterol Sulfate (PROAIR RESPICLICK IN) Inhale 366 mcg into the lungs 3 (three) times daily as needed (shortness of breath).     . ALPRAZolam (XANAX) 0.5 MG tablet Take 0.5 mg by mouth 3 (three) times daily as needed for anxiety.    . gabapentin (NEURONTIN) 100 MG capsule Take 500 mg by mouth 2 (two) times daily.    . meloxicam (MOBIC) 15 MG tablet Take 15 mg by mouth daily as needed for pain.    Marland Kitchen oxyCODONE-acetaminophen (PERCOCET) 5-325 MG tablet Take 1-2 tablets by mouth every 4 (four) hours as needed for severe pain. 15 tablet 0  . promethazine (PHENERGAN) 25 MG tablet Take 1 tablet (25 mg total) by mouth every 6 (six) hours as needed for nausea or vomiting. 30 tablet 0   No current facility-administered medications for  this visit.     Social History   Social History  . Marital status: Married    Spouse name: N/A  . Number of children: N/A  . Years of education: N/A   Occupational History  . 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 Smoking status: Former Smoker    Packs/day: 1.00    Years: 25.00    Types: Cigarettes    Quit date: 03/28/2015  . Smokeless tobacco: Never Used  . Alcohol use No     Comment: Previous h/o alcoholism, sober x 8 years  . Drug use: Yes    Types: Marijuana     Comment: remote h/o IVDU, has been worked up for parenterally transmitted infections multiple times since  . Sexual activity: Yes    Partners: Male   Other Topics Concern  . Not on file   Social History Narrative   Lives with husband who is also her business partner. They own and run a bar/club. Recent financial difficulties have been a significant source of stress for them. In addition, patient is grieving the loss of a close family member/friend in Oct 2016. As a result, Kade reports poor diet for the past several months with notable unintentional weight loss.    Family  History  Problem Relation Age of Onset  . Leukemia Mother       Marti Sleigh, MD 10/26/2016, 1:00 PM

## 2016-10-26 NOTE — Telephone Encounter (Signed)
Contact Sheri at [facility] and received a [employer]; record number for the patient. The medical record number is #[id number]

## 2016-10-26 NOTE — Progress Notes (Signed)
GYN Note Requested labs from the ER (CA125, CA 19-9, CEA), cancelled b/c looks like they hemolyzed. Pt has GO appt with Dr. Fermin Schwab today.  Durene Romans MD Attending Center for [employer] (Faculty Practice) 10/26/2016 Time: 10/26/16

## 2016-11-06 ENCOUNTER — Telehealth: Payer: Self-pay | Admitting: *Deleted

## 2016-11-06 NOTE — Telephone Encounter (Signed)
Attempted to reach the patient to schedule new patient appt with Dr. Alvy Bimler. Patient's voicemail box was full unable to leave message

## 2016-11-06 NOTE — Telephone Encounter (Signed)
Patient called back and was scheduled to see Dr. Alvy Bimler tommorow. Patient aware

## 2016-11-07 ENCOUNTER — Telehealth: Payer: Self-pay | Admitting: Hematology and Oncology

## 2016-11-07 ENCOUNTER — Ambulatory Visit (HOSPITAL_BASED_OUTPATIENT_CLINIC_OR_DEPARTMENT_OTHER): Payer: No Typology Code available for payment source | Admitting: Hematology and Oncology

## 2016-11-07 ENCOUNTER — Encounter: Payer: Self-pay | Admitting: Hematology and Oncology

## 2016-11-07 DIAGNOSIS — K5909 Other constipation: Secondary | ICD-10-CM

## 2016-11-07 DIAGNOSIS — R634 Abnormal weight loss: Secondary | ICD-10-CM | POA: Diagnosis not present

## 2016-11-07 DIAGNOSIS — C541 Malignant neoplasm of endometrium: Secondary | ICD-10-CM | POA: Diagnosis not present

## 2016-11-07 NOTE — Telephone Encounter (Signed)
Scheduled appt per 6/13 los - unable to schedule for 6/27 and 7/05 due to availability will contact patient when appt scheduled. - Patient aware.

## 2016-11-07 NOTE — Progress Notes (Signed)
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 I would be surprised if this patient died in the next year Line of therapy: First Line Patient Status: Medically Operable Intent of Therapy: Curative Intent, Discussed with Patient

## 2016-11-08 DIAGNOSIS — R634 Abnormal weight loss: Secondary | ICD-10-CM | POA: Insufficient documentation

## 2016-11-08 DIAGNOSIS — K5903 Drug induced constipation: Secondary | ICD-10-CM | POA: Insufficient documentation

## 2016-11-08 NOTE — Assessment & Plan Note (Signed)
She has recent weight loss due to cancer diagnosis and recent surgery I recommend increase oral intake as tolerated

## 2016-11-08 NOTE — Progress Notes (Signed)
[place] CONSULT NOTE  Patient Care Team: Patient, No Pcp Per as PCP - General (General Practice)  CHIEF COMPLAINTS/PURPOSE OF CONSULTATION:  Locally advanced newly diagnosed endometrial cancer  HISTORY OF PRESENTING ILLNESS:  Kerry Morris 51 y.o. female is here because of recent diagnosis of endometrial cancer. This patient have history of abnormal Pap smear and tubo-ovarian abscess. She presented to the emergency department last month with changes in bowel habits, severe abdominal pain and weight loss She has became menopausal less than 5 years ago.  She has no children.  Over the past year, she had some vaginal spotting.  She subsequently presented to the emergency department for evaluation leading to subsequent surgery I review her records extensively and summarized as follows:   Endometrial cancer ([place])   04/05/2015 Imaging    CT abdomen:  1. Large bilateral hydrosalpinx/pyosalpinx. 2. Enlarged fibroid uterus. 3. Mild bilateral hydronephrosis from #1.       04/06/2015 Procedure    Status post CT-guided drainage of right and left tubo-ovarian abscess, with parallel drains from a right trans gluteal approach. Sample was sent from both the left and the right fluid collections to the lab.      04/19/2015 Imaging    CT abdomen and pelvis: The bilateral pelvic fluid collections have resolved following placement of the percutaneous drainage catheters. Small amount of fluid in the pelvis but no evidence for residual abscess collections or hydrosalpinx. Fibroid uterus. Innumerable uterine fibroids. Many of these fibroids are pedunculated. Development of a small left pleural effusion. Mild fat stranding in the abdominal and pelvic mesentery suggestive for mild edema      05/03/2015 Imaging    US abdomen and pelvis 1. Significantly regressed but not completely resolved bilateral hydrosalpinx/pyosalpinx. Residual is greater on the right (18 mm diameter) with mild fluid  complexity. 2. Stable fibroid uterus. 3. No pelvic free fluid      07/06/2015 Pathology Results    PAP smear NEGATIVE FOR INTRAEPITHELIAL LESIONS OR MALIGNANCY.      07/18/2015 Pathology Results    Vulva, biopsy, mass - BENIGN EPIDERMAL CYST. - NO EVIDENCE OF MALIGNANCY      10/21/2016 Initial Diagnosis    She presented to the ER for severe abdominal pain      10/22/2016 Imaging    CT abdomen and pelvis 1. 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 2. Complex partially cystic mass at the left adnexa, measuring 5.9 x 5.5 x 4.3 cm. This raises concern for primary ovarian malignancy. PET/CT could be considered for further evaluation. 3. Numerous uterine fibroids again noted, some which have increased in size. Underlying endometrial malignancy cannot be excluded. 4. Dilated bilateral fluid collections have reaccumulated within the pelvis, the largest of which measures 15 cm in length. 5. Enlarged bilateral pelvic sidewall nodes measure up to 1.6 cm in short axis, compatible with metastatic disease. Mildly prominent retroperitoneal nodes seen. Mildly prominent nodes about the left inguinal vessels. 6. 1.6 cm cyst at the medial right hepatic lobe.       10/29/2016 - 11/02/2016 Hospital Admission    She was admitted to [facility] for management of newly diagnosed endometrial cancer      10/29/2016 Tumor Marker    Patient's tumor was tested for the following markers: CA125 Results of the tumor marker test revealed 740      10/30/2016 Surgery    Procedure(s): - Open ureteral exploration - Cystourethroscopy -  Left ureteral stent placement  Operative Findings:   1.) Normal cystourethrosocpy without lesions, masses or stones. Bilateral ureteral orifices identified with efflux of clear yellow urine 2.) Successful placement of left ureteral stent under visualization with appropriate curl visualized within  the bladder and stent palpable within the ureter to the level of the renal pelvis 3) Open exploration determined ureter to be intact and viable.      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) - Carcinoma involves outer half of myometrium, serosal surface, and cervix - Bilateral ovaries / adnexa are positive for high grade serous carcinoma - Extensive lymphovascular space invasion is present - See synoptic report and comment  B: "Free floating abdominal mass", removal  - Benign cyst with mesothelial-type lining, consistent with peritoneal inclusion cyst, size 7.0 cm - No malignancy identified   C: Posterior cul-de-sac mass, excision  - Involved by high grade serous carcinoma  D: Lymph nodes, left pelvic, resection  - Five of six lymph nodes involved by metastatic high grade serous carcinoma, size of metastases up to 1.5 cm, with no definite extracapsular extension identified (5/6)  E: Lymph nodes, right pelvic, excision  - Four of four lymph nodes involved by metastatic high grade serous carcinoma, size of metastases up to 1.6 cm, with no definite extracapsular extension identified (4/4)  F: Lymph nodes, right para-aortic, resection  - Three of four lymph nodes involved by metastatic high grade serous carcinoma, size of metastases up to 1.7 cm, with no definite extracapsular extension identified (3/4)  G: Omentum, omentectomy  - Involved by metastatic high grade serous carcinoma, size of nodules up to 2.9 cm   Given the presence of a large endometrial mass with serous endometrial intraepithelial carcinoma as well as deep myometrial invasion and cervical and serosal involvement, the tumor is staged as an endometrial primary. However, ovarian primary with spread to the uterus or synchronous primary tumors are  also a diagnostic possibility, and definitive site of origin cannot be determined histologically. Extensive lymphovascular space invasion is present, and the tumor involves both ovaries, peritoneum, omentum, and multiple lymph nodes. No distinct fallopian tubes are identified grossly or microscopically; however, they may have been incorporated into the ovarian / adnexal tumor masses.  Immunohistochemical stains are performed on block A11 and demonstrate the tumor is positive for p53 and WT1 and is strongly diffusely positive for p16. It shows patchy focal staining for ER. WT1 staining is also positive in block A15, the right ovary. These findings are consistent with the diagnosis of high grade serous carcinoma      She has lost over 10 pounds due to recent diagnosis of cancer and surgery.  Her bowel habits are improving She has mild abdominal pain, stable on prescription pain medicine after recent surgery She denies recent nausea  MEDICAL HISTORY:  Past Medical History:  Diagnosis Date  . Anxiety   . Anxiety and depression   . Depression     SURGICAL HISTORY: Past Surgical History:  Procedure Laterality Date  . CERVICAL BIOPSY  W/ LOOP ELECTRODE EXCISION    . DENTAL SURGERY      SOCIAL HISTORY: Social History   Social History  . Marital status: Married    Spouse name: Kirtland Bouchard  . Number of children: 0  . Years of education: N/A   Occupational History  . 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 Smoking status: Former Smoker    Packs/day: 1.00    Years: 25.00    Types: Cigarettes    Quit date: 03/28/2015  . Smokeless tobacco: Never Used  . Alcohol use No     Comment: Previous h/o alcoholism, sober x 8 years  . Drug use: Yes    Types: Marijuana     Comment: remote h/o IVDU, has been worked up for parenterally transmitted infections multiple times since  . Sexual activity: Yes    Partners: Male   Other Topics Concern  . Not on file   Social History  Narrative   Lives with husband who is also her business partner. They own and run a bar/club. Recent financial difficulties have been a significant source of stress for them. In addition, patient is grieving the loss of a close family member/friend in Oct 2016. As a result, Kerry Morris reports poor diet for the past several months with notable unintentional weight loss.    FAMILY HISTORY: Family History  Problem Relation Age of Onset  . Leukemia Mother     ALLERGIES:  has No Known Allergies.  MEDICATIONS:  Current Outpatient Prescriptions  Medication Sig Dispense Refill  . acetaminophen (TYLENOL) 325 MG tablet Take 650 mg by mouth every 6 (six) hours as needed.    . ALPRAZolam (XANAX) 0.5 MG tablet Take 0.5 mg by mouth 3 (three) times daily as needed for anxiety.    . docusate sodium (COLACE) 100 MG capsule Take 100 mg by mouth 2 (two) times daily.    Marland Kitchen enoxaparin (LOVENOX) 40 MG/0.4ML injection Inject 40 mg into the skin daily.    Marland Kitchen ibuprofen (ADVIL,MOTRIN) 600 MG tablet Take 600 mg by mouth every 6 (six) hours as needed.    Marland Kitchen oxyCODONE-acetaminophen (PERCOCET) 5-325 MG tablet Take 1-2 tablets by mouth every 4 (four) hours as needed for severe pain. 15 tablet 0  . promethazine (PHENERGAN) 25 MG tablet Take 1 tablet (25 mg total) by mouth every 6 (six) hours as needed for nausea or vomiting. 30 tablet 0  . simethicone (MYLICON) 80 MG chewable tablet Chew 80 mg by mouth 4 (four) times daily as needed.    . Albuterol Sulfate (PROAIR RESPICLICK IN) Inhale 130 mcg into the lungs 3 (three) times daily as needed (shortness of breath).      No current facility-administered medications for this visit.     REVIEW OF SYSTEMS:   Constitutional: Denies fevers, chills or abnormal night sweats Eyes: Denies blurriness of vision, double vision or watery eyes Ears, nose, mouth, throat, and face: Denies mucositis or sore throat Respiratory: Denies cough, dyspnea or wheezes Cardiovascular: Denies  palpitation, chest discomfort or lower extremity swelling Skin: Denies abnormal skin rashes Lymphatics: Denies new lymphadenopathy or easy bruising Neurological:Denies numbness, tingling or new weaknesses Behavioral/Psych: Mood is stable, no new changes  All other systems were reviewed with the patient and are negative.  PHYSICAL EXAMINATION: ECOG PERFORMANCE STATUS: 1 - Symptomatic but completely ambulatory  Vitals:   11/07/16 1432  BP: 117/73  Pulse: (!) 109  Resp: 20  Temp: 98.1 F (36.7 C)   Filed Weights   11/07/16 1432  Weight: 104 lb 8 oz (47.4 kg)    GENERAL:alert, no distress and comfortable.  She looks thin and mildly cachectic SKIN: skin color, texture, turgor are normal, no rashes or significant lesions EYES: normal, conjunctiva are pink and non-injected, sclera clear OROPHARYNX:no exudate, no erythema and  lips, buccal mucosa, and tongue normal  NECK: supple, thyroid normal size, non-tender, without nodularity LYMPH:  no palpable lymphadenopathy in the cervical, axillary or inguinal LUNGS: clear to auscultation and percussion with normal breathing effort HEART: regular rate & rhythm and no murmurs and no lower extremity edema ABDOMEN:abdomen soft, normal bowel sounds.  Well-healed surgical scar, mildly tender to touch Musculoskeletal:no cyanosis of digits and no clubbing  PSYCH: alert & oriented x 3 with fluent speech NEURO: no focal motor/sensory deficits  LABORATORY DATA:  I have reviewed the data as listed Lab Results  Component Value Date   WBC 7.4 10/22/2016   HGB 14.9 10/22/2016   HCT 43.3 10/22/2016   MCV 90.6 10/22/2016   PLT 292 10/22/2016    Recent Labs  10/21/16 1756 10/22/16 1619 10/22/16 2224  NA 138 137 136  K 4.1 4.4 5.7*  CL 104 102 101  CO2 25  --  26  GLUCOSE 101* 117* 106*  BUN 9 16 12   CREATININE 0.70 0.50 0.60  CALCIUM 9.4  --  8.8*  GFRNONAA >60  --  >60  GFRAA >60  --  >60  PROT 7.9  --  6.5  ALBUMIN 4.4  --  3.9  AST  30  --  56*  ALT 20  --  24  ALKPHOS 69  --  55  BILITOT 0.6  --  1.2    RADIOGRAPHIC STUDIES: I have personally reviewed the radiological images as listed and agreed with the findings in the report. Dg Abdomen 1 View  Result Date: 10/21/2016 CLINICAL DATA:  Lower abdominal pain.  No bowel movement for 3 days. EXAM: ABDOMEN - 1 VIEW COMPARISON:  Pelvic ultrasound 05/03/2015. Pelvic CT 04/05/2015 and 04/19/2015. FINDINGS: The bowel gas pattern is normal. There is no supine evidence of free intraperitoneal air. Calcified uterine fibroid projects over the sacrum. There is increased density within the pelvis which is nonspecific. Previous studies have demonstrated multiple uterine fibroids and bilateral hydrosalpinges (the latter previously drained). IMPRESSION: 1. No acute findings. 2. Persistent increased density in the pelvis, likely related to known uterine fibroids. This could be further evaluated with repeat CT or ultrasound as clinically warranted. Electronically Signed   By: Richardean Sale M.D.   On: 10/21/2016 20:22   Ct Abdomen Pelvis W Contrast  Result Date: 10/22/2016 CLINICAL DATA:  Acute onset of lower pelvic pain, constipation, nausea and vomiting. Initial encounter. EXAM: CT ABDOMEN AND PELVIS WITH CONTRAST TECHNIQUE: Multidetector CT imaging of the abdomen and pelvis was performed using the standard protocol following bolus administration of intravenous contrast. CONTRAST:  138m ISOVUE-300 IOPAMIDOL (ISOVUE-300) INJECTION 61% COMPARISON:  Abdominal radiograph performed 10/21/2016, and CT of the abdomen and pelvis from 04/19/2015 FINDINGS: Lower chest: The visualized lung bases are grossly clear. The visualized portions of the mediastinum are unremarkable. Hepatobiliary: A 1.6 cm cyst is noted at the medial right hepatic lobe. The liver is otherwise unremarkable. The gallbladder is grossly unremarkable in appearance. The common bile duct remains normal in caliber. Pancreas: The pancreas  is within normal limits. Spleen: The spleen is unremarkable in appearance. Adrenals/Urinary Tract: The adrenal glands are unremarkable in appearance. The kidneys are within normal limits. There is no evidence of hydronephrosis. No renal or ureteral stones are identified. No perinephric stranding is seen. Stomach/Bowel: The appendix is normal in caliber. The colon is grossly unremarkable in appearance. Visualized small bowel loops are grossly unremarkable in appearance. The stomach is grossly unremarkable. Vascular/Lymphatic: Minimal calcification is noted along the  aortic bifurcation. The inferior vena cava is grossly unremarkable. Mildly prominent retroperitoneal nodes are seen. Enlarged bilateral pelvic sidewall nodes measure up to 1.6 cm in short axis. Diffuse peritoneal carcinomatosis is noted along the mid to lower abdomen, new from 2016. Mildly prominent nodes are noted about the left inguinal vessels. Reproductive: Numerous uterine fibroids are again noted, some of which have increased in size. Underlying malignancy cannot be excluded. Dilated bilateral fluid collections are again noted within the pelvis, the largest of which measures 15 cm in length. There is a complex partially cystic mass at the left adnexa, measuring 5.9 x 5.5 x 4.3 cm. This may reflect a primary ovarian malignancy. Other: Small volume ascites is noted within the abdomen and pelvis. Musculoskeletal: No acute osseous abnormalities are identified. The visualized musculature is unremarkable in appearance. IMPRESSION: 1. 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 2. Complex partially cystic mass at the left adnexa, measuring 5.9 x 5.5 x 4.3 cm. This raises concern for primary ovarian malignancy. PET/CT could be considered for further evaluation. 3. Numerous uterine fibroids again noted, some which have increased in size. Underlying endometrial malignancy cannot  be excluded. 4. Dilated bilateral fluid collections have reaccumulated within the pelvis, the largest of which measures 15 cm in length. 5. Enlarged bilateral pelvic sidewall nodes measure up to 1.6 cm in short axis, compatible with metastatic disease. Mildly prominent retroperitoneal nodes seen. Mildly prominent nodes about the left inguinal vessels. 6. 1.6 cm cyst at the medial right hepatic lobe. Electronically Signed   By: Garald Balding M.D.   On: 10/22/2016 21:26    ASSESSMENT & PLAN:  Endometrial cancer ([place]) I review surgical report and pathology report with the patient and her husband. I recommend repeat staging with CT scan of the chest, abdomen and pelvis with contrast along with baseline tumor marker before we proceed with treatment I have also requested additional testing on her pathology sample for MSI testing I suspect her cancer might have happened over a year ago when she presented with abscess I recommend genetic counseling referral and she agreed to proceed She is not ready to begin treatment yet I will see her at the end of the month to review test results, chemotherapy consent and others We discussed port placement but at present time, the patient declined port placement  Weight loss She has recent weight loss due to cancer diagnosis and recent surgery I recommend increase oral intake as tolerated  Other constipation She had recent constipation.  I recommend regular Senokot  Orders Placed This Encounter  Procedures  . CT CHEST W CONTRAST    Standing Status:   Future    Standing Expiration Date:   12/12/2017    Order Specific Question:   Reason for exam:    Answer:   staging endometrial ca    Order Specific Question:   Is the patient pregnant?    Answer:   No    Order Specific Question:   Preferred imaging location?    Answer:   [facility]  . CT ABDOMEN PELVIS W CONTRAST    Standing Status:   Future    Standing Expiration Date:   12/12/2017    Order  Specific Question:   Reason for exam:    Answer:   staging endometrial ca    Order Specific Question:   Is the patient pregnant?    Answer:   No    Order Specific  Question:   Preferred imaging location?    Answer:   [facility]  . CBC with Differential/Platelet    Standing Status:   Future    Standing Expiration Date:   12/12/2017  . Comprehensive metabolic panel    Standing Status:   Future    Standing Expiration Date:   12/12/2017  . CA 125    Standing Status:   Future    Standing Expiration Date:   12/12/2017  . Ambulatory referral to Genetics    Referral Priority:   Routine    Referral Type:   Consultation    Referral Reason:   Specialty Services Required    Number of Visits Requested:   1     All questions were answered. The patient knows to call the clinic with any problems, questions or concerns. I spent 60 minutes counseling the patient face to face. The total time spent in the appointment was 80 minutes and more than 50% was on counseling.     Heath Lark, MD 11/08/2016 8:00 AM

## 2016-11-08 NOTE — Assessment & Plan Note (Signed)
She had recent constipation.  I recommend regular Senokot

## 2016-11-08 NOTE — Assessment & Plan Note (Signed)
I review surgical report and pathology report with the patient and her husband. I recommend repeat staging with CT scan of the chest, abdomen and pelvis with contrast along with baseline tumor marker before we proceed with treatment I have also requested additional testing on her pathology sample for MSI testing I suspect her cancer might have happened over a year ago when she presented with abscess I recommend genetic counseling referral and she agreed to proceed She is not ready to begin treatment yet I will see her at the end of the month to review test results, chemotherapy consent and others We discussed port placement but at present time, the patient declined port placement

## 2016-11-14 ENCOUNTER — Encounter: Payer: Self-pay | Admitting: *Deleted

## 2016-11-19 ENCOUNTER — Encounter: Payer: Self-pay | Admitting: Gynecologic Oncology

## 2016-11-19 NOTE — Progress Notes (Signed)
Gynecologic Oncology Multi-Disciplinary Disposition Conference Note  Date of the Conference: November 19, 2016  Patient Name: Kerry Morris  Referring Provider: Dr. Ilda Basset Primary GYN Oncologist: Dr. Gillian Scarce  Stage/Disposition:  Stage IV uterine cancer.  Disposition is to chemotherapy.  Genetics referral.  MSI pending at [facility].   This Multidisciplinary conference took place involving physicians from [place], [place], Radiation Oncology, Pathology, Radiology along with the Gynecologic Oncology Nurse Practitioner and RN.  Comprehensive assessment of the patient's malignancy, staging, need for surgery, chemotherapy, radiation therapy, and need for further testing were reviewed. Supportive measures, both inpatient and following discharge were also discussed. The recommended plan of care is documented. Greater than 35 minutes were spent correlating and coordinating this patient's care.

## 2016-11-20 ENCOUNTER — Encounter (HOSPITAL_COMMUNITY): Payer: Self-pay

## 2016-11-20 ENCOUNTER — Ambulatory Visit (HOSPITAL_COMMUNITY)
Admission: RE | Admit: 2016-11-20 | Discharge: 2016-11-20 | Disposition: A | Discharge status: Home / Self Care | Payer: No Typology Code available for payment source | Source: Ambulatory Visit | Attending: Hematology and Oncology | Admitting: Hematology and Oncology

## 2016-11-20 ENCOUNTER — Other Ambulatory Visit (HOSPITAL_BASED_OUTPATIENT_CLINIC_OR_DEPARTMENT_OTHER): Payer: No Typology Code available for payment source

## 2016-11-20 ENCOUNTER — Ambulatory Visit (HOSPITAL_BASED_OUTPATIENT_CLINIC_OR_DEPARTMENT_OTHER): Payer: No Typology Code available for payment source | Admitting: Hematology and Oncology

## 2016-11-20 ENCOUNTER — Other Ambulatory Visit: Payer: No Typology Code available for payment source

## 2016-11-20 VITALS — BP 145/88 | HR 98 | Temp 98.4°F | Resp 18 | Ht 65.0 in | Wt 106.9 lb

## 2016-11-20 DIAGNOSIS — C541 Malignant neoplasm of endometrium: Secondary | ICD-10-CM

## 2016-11-20 DIAGNOSIS — N1330 Unspecified hydronephrosis: Secondary | ICD-10-CM | POA: Insufficient documentation

## 2016-11-20 DIAGNOSIS — G893 Neoplasm related pain (acute) (chronic): Secondary | ICD-10-CM | POA: Diagnosis not present

## 2016-11-20 DIAGNOSIS — I700 Atherosclerosis of aorta: Secondary | ICD-10-CM | POA: Diagnosis not present

## 2016-11-20 DIAGNOSIS — R188 Other ascites: Secondary | ICD-10-CM | POA: Insufficient documentation

## 2016-11-20 DIAGNOSIS — K5909 Other constipation: Secondary | ICD-10-CM | POA: Diagnosis not present

## 2016-11-20 DIAGNOSIS — R748 Abnormal levels of other serum enzymes: Secondary | ICD-10-CM | POA: Diagnosis not present

## 2016-11-20 DIAGNOSIS — J439 Emphysema, unspecified: Secondary | ICD-10-CM | POA: Diagnosis not present

## 2016-11-20 LAB — CBC WITH DIFFERENTIAL/PLATELET
BASO%: 0.3 % (ref 0.0–2.0)
BASOS ABS: 0 10*3/uL (ref 0.0–0.1)
EOS ABS: 0.1 10*3/uL (ref 0.0–0.5)
EOS%: 1 % (ref 0.0–7.0)
HEMATOCRIT: 33.9 % — AB (ref 34.8–46.6)
HEMOGLOBIN: 11.4 g/dL — AB (ref 11.6–15.9)
LYMPH%: 26.5 % (ref 14.0–49.7)
MCH: 31.5 pg (ref 25.1–34.0)
MCHC: 33.6 g/dL (ref 31.5–36.0)
MCV: 93.6 fL (ref 79.5–101.0)
MONO#: 0.6 10*3/uL (ref 0.1–0.9)
MONO%: 9.6 % (ref 0.0–14.0)
NEUT#: 3.7 10*3/uL (ref 1.5–6.5)
NEUT%: 62.6 % (ref 38.4–76.8)
PLATELETS: 336 10*3/uL (ref 145–400)
RBC: 3.62 10*6/uL — ABNORMAL LOW (ref 3.70–5.45)
RDW: 13.5 % (ref 11.2–14.5)
WBC: 5.9 10*3/uL (ref 3.9–10.3)
lymph#: 1.6 10*3/uL (ref 0.9–3.3)

## 2016-11-20 LAB — COMPREHENSIVE METABOLIC PANEL
ALK PHOS: 84 U/L (ref 40–150)
ALT: 78 U/L — ABNORMAL HIGH (ref 0–55)
ANION GAP: 12 meq/L — AB (ref 3–11)
AST: 51 U/L — AB (ref 5–34)
Albumin: 3.6 g/dL (ref 3.5–5.0)
BUN: 13.4 mg/dL (ref 7.0–26.0)
CALCIUM: 10.4 mg/dL (ref 8.4–10.4)
CO2: 28 mEq/L (ref 22–29)
Chloride: 102 mEq/L (ref 98–109)
Creatinine: 0.7 mg/dL (ref 0.6–1.1)
EGFR: >90 mL/min/{1.73_m2} (ref >90)
Glucose: 108 mg/dl (ref 70–140)
POTASSIUM: 4.1 meq/L (ref 3.5–5.1)
Sodium: 141 mEq/L (ref 136–145)
Total Bilirubin: <0.22 mg/dL (ref 0.20–1.20)
Total Protein: 7.2 g/dL (ref 6.4–8.3)

## 2016-11-20 MED ORDER — IOPAMIDOL (ISOVUE-300) INJECTION 61%
INTRAVENOUS | Status: AC
  Filled 2016-11-20: qty 100

## 2016-11-20 MED ORDER — DEXAMETHASONE 4 MG PO TABS: ORAL_TABLET | ORAL | 1 refills | Status: DC

## 2016-11-20 MED ORDER — PROCHLORPERAZINE MALEATE 10 MG PO TABS: 10.0000 mg | ORAL_TABLET | Freq: Four times a day (QID) | ORAL | 1 refills | Status: DC | PRN

## 2016-11-20 MED ORDER — OXYCODONE HCL 5 MG PO TABS: 5.0000 mg | ORAL_TABLET | ORAL | 0 refills | Status: DC | PRN

## 2016-11-20 MED ORDER — IOPAMIDOL (ISOVUE-300) INJECTION 61%
INTRAVENOUS | Status: AC
  Administered 2016-11-20: 30 mL via ORAL
  Filled 2016-11-20: qty 30

## 2016-11-20 MED ORDER — ONDANSETRON HCL 8 MG PO TABS: 8.0000 mg | ORAL_TABLET | Freq: Two times a day (BID) | ORAL | 1 refills | Status: DC | PRN

## 2016-11-20 MED ORDER — IOPAMIDOL (ISOVUE-300) INJECTION 61%
100.0000 mL | Freq: Once | INTRAVENOUS | Status: AC | PRN
  Administered 2016-11-20: 100 mL via INTRAVENOUS

## 2016-11-20 MED ORDER — IOPAMIDOL (ISOVUE-300) INJECTION 61%
30.0000 mL | Freq: Once | INTRAVENOUS | Status: AC | PRN
  Administered 2016-11-20: 30 mL via ORAL

## 2016-11-20 MED FILL — ONDANSETRON HCL 8 MG TAB: 8 | 15 days supply | Qty: 30 | Fill #0

## 2016-11-20 MED FILL — DEXAMETHASONE 4 MG TABLET: 4 | 63 days supply | Qty: 30 | Fill #0

## 2016-11-20 MED FILL — PROCHLORPERAZINE 10 MG TAB: 10 | 8 days supply | Qty: 30 | Fill #0

## 2016-11-20 MED FILL — oxyCODONE HCL 5 MG TABS: 5 | 10 days supply | Qty: 60 | Fill #0

## 2016-11-21 ENCOUNTER — Telehealth: Payer: Self-pay

## 2016-11-21 ENCOUNTER — Ambulatory Visit: Payer: No Typology Code available for payment source | Admitting: Hematology and Oncology

## 2016-11-21 ENCOUNTER — Encounter: Payer: Self-pay | Admitting: Hematology and Oncology

## 2016-11-21 DIAGNOSIS — R748 Abnormal levels of other serum enzymes: Secondary | ICD-10-CM | POA: Insufficient documentation

## 2016-11-21 DIAGNOSIS — G893 Neoplasm related pain (acute) (chronic): Secondary | ICD-10-CM | POA: Insufficient documentation

## 2016-11-21 LAB — CA 125: Cancer Antigen (CA) 125: 154.2 U/mL — ABNORMAL HIGH (ref 0.0–38.1)

## 2016-11-21 NOTE — Telephone Encounter (Signed)
-----   Message from Heath Lark, MD sent at 11/21/2016  7:04 AM EDT ----- Regarding: CA 125 and CT PLs call her husband and let him know CT and blood work confirmed residual disease (we discussed this). Plan to proceed with chemo as scheduled ----- Message ----- From: Interface, Lab In Three Zero One Sent: 11/20/2016   9:52 AM To: Heath Lark, MD

## 2016-11-21 NOTE — Assessment & Plan Note (Signed)
The patient has very high risk disease. She is being seen urgently today with cancer associated pain. CT scan and blood work confirmed residual disease. We have extensive discussion about the urgency of the situation and the need to start treatment as soon as possible. The patient has declined port placement right now. We will reschedule chemo education class until next week We discussed the risks, benefits, side effects of chemotherapy with combination treatment with carboplatin and Taxol. We discussed the role of chemotherapy. The intent is of curative intent.  We discussed some of the risks, benefits, side-effects of carboplatin & Taxol  Some of the short term side-effects included, though not limited to, including weight loss, life threatening infections, risk of allergic reactions, need for transfusions of blood products, nausea, vomiting, change in bowel habits, loss of hair, admission to hospital for various reasons, and risks of death.   Long term side-effects are also discussed including risks of infertility, permanent damage to nerve function, hearing loss, chronic fatigue, kidney damage with possibility needing hemodialysis, and rare secondary malignancy including bone marrow disorders.  The patient is aware that the response rates discussed earlier is not guaranteed.  After a long discussion, patient made an informed decision to proceed with the prescribed plan of care.  I have reminded her to take premedications with dexamethasone before treatment. Given her young age, I will omit G-CSF support for cycle 1 and observe to see whether this is actually needed. I plan to see her back prior to cycle 2 of treatment.

## 2016-11-21 NOTE — Assessment & Plan Note (Signed)
She has cancer associated pain. I have prescribed oxycodone to take as needed. I warned her about risk of constipation and nausea We discussed narcotic refill policy.

## 2016-11-21 NOTE — Progress Notes (Signed)
[facility] OFFICE PROGRESS NOTE  Patient Care Team: Patient, No Pcp Per as PCP - General (General Practice)  SUMMARY OF ONCOLOGIC HISTORY:   Endometrial cancer ([place])   04/05/2015 Imaging    CT abdomen:  1. Large bilateral hydrosalpinx/pyosalpinx. 2. Enlarged fibroid uterus. 3. Mild bilateral hydronephrosis from #1.       04/06/2015 Procedure    Status post CT-guided drainage of right and left tubo-ovarian abscess, with parallel drains from a right trans gluteal approach. Sample was sent from both the left and the right fluid collections to the lab.      04/19/2015 Imaging    CT abdomen and pelvis: The bilateral pelvic fluid collections have resolved following placement of the percutaneous drainage catheters. Small amount of fluid in the pelvis but no evidence for residual abscess collections or hydrosalpinx. Fibroid uterus. Innumerable uterine fibroids. Many of these fibroids are pedunculated. Development of a small left pleural effusion. Mild fat stranding in the abdominal and pelvic mesentery suggestive for mild edema      05/03/2015 Imaging    US abdomen and pelvis 1. Significantly regressed but not completely resolved bilateral hydrosalpinx/pyosalpinx. Residual is greater on the right (18 mm diameter) with mild fluid complexity. 2. Stable fibroid uterus. 3. No pelvic free fluid      07/06/2015 Pathology Results    PAP smear NEGATIVE FOR INTRAEPITHELIAL LESIONS OR MALIGNANCY.      07/18/2015 Pathology Results    Vulva, biopsy, mass - BENIGN EPIDERMAL CYST. - NO EVIDENCE OF MALIGNANCY      10/21/2016 Initial Diagnosis    She presented to the ER for severe abdominal pain      10/22/2016 Imaging    CT abdomen and pelvis 1. 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 2. Complex partially cystic mass at the left adnexa, measuring 5.9 x 5.5 x 4.3 cm. This raises concern for  primary ovarian malignancy. PET/CT could be considered for further evaluation. 3. Numerous uterine fibroids again noted, some which have increased in size. Underlying endometrial malignancy cannot be excluded. 4. Dilated bilateral fluid collections have reaccumulated within the pelvis, the largest of which measures 15 cm in length. 5. Enlarged bilateral pelvic sidewall nodes measure up to 1.6 cm in short axis, compatible with metastatic disease. Mildly prominent retroperitoneal nodes seen. Mildly prominent nodes about the left inguinal vessels. 6. 1.6 cm cyst at the medial right hepatic lobe.       10/29/2016 - 11/02/2016 Hospital Admission    She was admitted to [facility] for management of newly diagnosed endometrial cancer      10/29/2016 Tumor Marker    Patient's tumor was tested for the following markers: CA125 Results of the tumor marker test revealed 740      10/30/2016 Surgery    Procedure(s): - Open ureteral exploration - Cystourethroscopy - Left ureteral stent placement  Operative Findings:   1.) Normal cystourethrosocpy without lesions, masses or stones. Bilateral ureteral orifices identified with efflux of clear yellow urine 2.) Successful placement of left ureteral stent under visualization with appropriate curl visualized within the bladder and stent palpable within the ureter to the level of the renal pelvis 3) Open exploration determined ureter to be intact and viable.      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) - Carcinoma involves outer half of myometrium, serosal surface, and cervix - Bilateral ovaries / adnexa are positive for high grade serous carcinoma - Extensive lymphovascular space invasion is present - See synoptic report and comment  B: "Free floating abdominal mass",  removal  - Benign cyst with mesothelial-type lining, consistent with peritoneal inclusion cyst, size 7.0 cm - No malignancy identified   C: Posterior cul-de-sac mass, excision  - Involved by high grade serous carcinoma  D: Lymph nodes, left pelvic, resection  - Five of six lymph nodes involved by metastatic high grade serous carcinoma, size of metastases up to 1.5 cm, with no definite extracapsular extension identified (5/6)  E: Lymph nodes, right pelvic, excision  - Four of four lymph nodes involved by metastatic high grade serous carcinoma, size of metastases up to 1.6 cm, with no definite extracapsular extension identified (4/4)  F: Lymph nodes, right para-aortic, resection  - Three of four lymph nodes involved by metastatic high grade serous carcinoma, size of metastases up to 1.7 cm, with no definite extracapsular extension identified (3/4)  G: Omentum, omentectomy  - Involved by metastatic high grade serous carcinoma, size of nodules up to 2.9 cm   Given the presence of a large endometrial mass with serous endometrial intraepithelial carcinoma as well as deep myometrial invasion and cervical and serosal involvement, the tumor is staged as an endometrial primary. However, ovarian primary with spread to the uterus or synchronous primary tumors are also a diagnostic possibility, and definitive site of origin cannot be determined histologically. Extensive lymphovascular space invasion is present, and the tumor involves both ovaries, peritoneum, omentum, and multiple lymph nodes. No distinct fallopian tubes are identified grossly or microscopically; however, they may have been incorporated into the ovarian / adnexal tumor masses.  Immunohistochemical stains are performed on block A11 and demonstrate the tumor is positive for p53 and WT1 and is strongly diffusely positive for p16. It shows patchy focal staining for ER. WT1 staining is also positive in block A15, the right ovary. These findings  are consistent with the diagnosis of high grade serous carcinoma      11/20/2016 Imaging    Ct scan of chest, abdomen and pelvis 1. Interval hysterectomy and likely oophorectomy, and possibly partial omentectomy. There is infiltration and nodularity of the upper omentum compatible with tumor along with complex density along the paracolic gutters, space of Retzius, and presacral space which may also represent tumor infiltration. 2. Soft tissue density favoring tumor along the right posterior piriformis muscle extending towards the vaginal cuff and cul-de-sac region. This likely represents tumor. 3. Stable or minimally increased retroperitoneal and pelvic adenopathy. This is thought to be malignant. 4. Reduced ascites. 5. Mild bilateral hydronephrosis and proximal hydroureter extending down to the iliac vessel cross over is were there is stranding and some mild adenopathy. This likely represents low-grade partial ureteral obstruction and is most likely from extrinsic mass effect. 6. Aortic Atherosclerosis (ICD10-I70.0) and Emphysema (ICD10-J43.9). 7. Presacral and perirectal tumor nodularity.      11/20/2016 Tumor Marker    Patient's tumor was tested for the following markers: CA-125. Results of the tumor marker test revealed 154.2.       INTERVAL HISTORY: Please see below for problem oriented charting. The patient is seen urgently due to severe abdominal pain. She is supposed to be seen in the different day but presented for lab work and then expressed concern for severe abdominal pain The pain is rated with severity of 10 out of 10.  There is 2 different type of pain.  1 of them is a crampy abdominal pain at the front and another one is bilateral flank pain at the back The lower abdominal pain is described as crampy in nature.  She denies significant constipation.   She also has severe back pain that comes and goes.  She denies nausea or vomiting.  Her appetite is poor. She feels weak  overall. She was prescribed Percocet but that did not help much. She denies fever or chills at home.  REVIEW OF SYSTEMS:   Constitutional: Denies fevers, chills or abnormal weight loss Eyes: Denies blurriness of vision Ears, nose, mouth, throat, and face: Denies mucositis or sore throat Respiratory: Denies cough, dyspnea or wheezes Cardiovascular: Denies palpitation, chest discomfort or lower extremity swelling Skin: Denies abnormal skin rashes Lymphatics: Denies new lymphadenopathy or easy bruising Neurological:Denies numbness, tingling or new weaknesses Behavioral/Psych: Mood is stable, no new changes  All other systems were reviewed with the patient and are negative.  I have reviewed the past medical history, past surgical history, social history and family history with the patient and they are unchanged from previous note.  ALLERGIES:  has No Known Allergies.  MEDICATIONS:  Current Outpatient Prescriptions  Medication Sig Dispense Refill  . acetaminophen (TYLENOL) 325 MG tablet Take 650 mg by mouth every 6 (six) hours as needed.    . Albuterol Sulfate (PROAIR RESPICLICK IN) Inhale 381 mcg into the lungs 3 (three) times daily as needed (shortness of breath).     . ALPRAZolam (XANAX) 0.5 MG tablet Take 0.5 mg by mouth 3 (three) times daily as needed for anxiety.    Marland Kitchen dexamethasone (DECADRON) 4 MG tablet Take 5 tablets at midnight and 5 tablets at 6 am the morning of treatment, by mouth, every 21 days 30 tablet 1  . docusate sodium (COLACE) 100 MG capsule Take 100 mg by mouth 2 (two) times daily.    Marland Kitchen enoxaparin (LOVENOX) 40 MG/0.4ML injection Inject 40 mg into the skin daily.    Marland Kitchen ibuprofen (ADVIL,MOTRIN) 600 MG tablet Take 600 mg by mouth every 6 (six) hours as needed.    . ondansetron (ZOFRAN) 8 MG tablet Take 1 tablet (8 mg total) by mouth 2 (two) times daily as needed for refractory nausea / vomiting. Start on day 3 after chemo. 30 tablet 1  . oxyCODONE (OXY IR/ROXICODONE) 5 MG  immediate release tablet Take 1 tablet (5 mg total) by mouth every 4 (four) hours as needed for severe pain. 60 tablet 0  . prochlorperazine (COMPAZINE) 10 MG tablet Take 1 tablet (10 mg total) by mouth every 6 (six) hours as needed (Nausea or vomiting). 30 tablet 1  . promethazine (PHENERGAN) 25 MG tablet Take 1 tablet (25 mg total) by mouth every 6 (six) hours as needed for nausea or vomiting. 30 tablet 0  . simethicone (MYLICON) 80 MG chewable tablet Chew 80 mg by mouth 4 (four) times daily as needed.     No current facility-administered medications for this visit.     PHYSICAL EXAMINATION: ECOG PERFORMANCE STATUS: 2 - Symptomatic, <50% confined to bed  Vitals:   11/20/16 1022  BP: (!) 145/88  Pulse: 98  Resp: 18  Temp: 98.4 F (36.9 C)   Filed Weights   11/20/16 1022  Weight: 106 lb 14.4 oz (48.5 kg)    GENERAL:alert, no distress and comfortable.  She looks thin and mildly cachectic SKIN: skin color, texture, turgor are normal, no rashes or significant lesions EYES:  normal, Conjunctiva are pink and non-injected, sclera clear OROPHARYNX:no exudate, no erythema and lips, buccal mucosa, and tongue normal  NECK: supple, thyroid normal size, non-tender, without nodularity LYMPH:  no palpable lymphadenopathy in the cervical, axillary or inguinal LUNGS: clear to auscultation and percussion with normal breathing effort HEART: regular rate & rhythm and no murmurs and no lower extremity edema ABDOMEN:abdomen soft, non-tender and normal bowel sounds.  Well-healed surgical scar is noted Musculoskeletal:no cyanosis of digits and no clubbing  NEURO: alert & oriented x 3 with fluent speech, no focal motor/sensory deficits  LABORATORY DATA:  I have reviewed the data as listed    Component Value Date/Time   NA 141 11/20/2016 0935   K 4.1 11/20/2016 0935   CL 101 10/22/2016 2224   CO2 28 11/20/2016 0935   GLUCOSE 108 11/20/2016 0935   BUN 13.4 11/20/2016 0935   CREATININE 0.7  11/20/2016 0935   CALCIUM 10.4 11/20/2016 0935   PROT 7.2 11/20/2016 0935   ALBUMIN 3.6 11/20/2016 0935   AST 51 (H) 11/20/2016 0935   ALT 78 (H) 11/20/2016 0935   ALKPHOS 84 11/20/2016 0935   BILITOT <0.22 11/20/2016 0935   GFRNONAA >60 10/22/2016 2224   GFRAA >60 10/22/2016 2224    No results found for: SPEP, UPEP  Lab Results  Component Value Date   WBC 5.9 11/20/2016   NEUTROABS 3.7 11/20/2016   HGB 11.4 (L) 11/20/2016   HCT 33.9 (L) 11/20/2016   MCV 93.6 11/20/2016   PLT 336 11/20/2016      Chemistry      Component Value Date/Time   NA 141 11/20/2016 0935   K 4.1 11/20/2016 0935   CL 101 10/22/2016 2224   CO2 28 11/20/2016 0935   BUN 13.4 11/20/2016 0935   CREATININE 0.7 11/20/2016 0935      Component Value Date/Time   CALCIUM 10.4 11/20/2016 0935   ALKPHOS 84 11/20/2016 0935   AST 51 (H) 11/20/2016 0935   ALT 78 (H) 11/20/2016 0935   BILITOT <0.22 11/20/2016 0935       RADIOGRAPHIC STUDIES: I have reviewed imaging study with the patient and her husband I have personally reviewed the radiological images as listed and agreed with the findings in the report. Ct Chest W Contrast  Result Date: 11/20/2016 CLINICAL DATA:  Endometrial cancer diagnosed in May 2018. Hysterectomy 3 weeks ago. Abdominal pain and cramping. Back pain. EXAM: CT CHEST, ABDOMEN, AND PELVIS WITH CONTRAST TECHNIQUE: Multidetector CT imaging of the chest, abdomen and pelvis was performed following the standard protocol during bolus administration of intravenous contrast. CONTRAST:  155m ISOVUE-300 IOPAMIDOL (ISOVUE-300) INJECTION 61% COMPARISON:  Multiple exams, including 10/22/2016 FINDINGS: CT CHEST FINDINGS Cardiovascular: Unremarkable Mediastinum/Nodes: Unremarkable Lungs/Pleura: Centrilobular emphysema. Mild biapical pleuroparenchymal scarring. 3 mm right upper lobe pulmonary nodule on image 45/6, stable from 2006 and considered benign. Musculoskeletal: Unremarkable CT ABDOMEN PELVIS FINDINGS  Hepatobiliary: Fluid density 1.4 by 1.7 cm lesion in segment 4 of the liver, image 57/2, stable. Pancreas: Punctate calcification in the head of the pancreas near the dorsal pancreatic duct on image 71/2, stable, otherwise normal. Spleen: Unremarkable Adrenals/Urinary Tract: Bilateral mild hydronephrosis and proximal hydroureter, terminating in the vicinity of the iliac vessel cross over where there is indistinct soft tissue density probably from indistinct nodal tissue contributing to extrinsic compression of the ureters. This is likely causing partial obstruction bilaterally. Adrenal glands unremarkable.  Distal ureters poorly seen. Stomach/Bowel: Mild prominence of stool in the colon. Vascular/Lymphatic: Mild aortoiliac atherosclerosis. Retroperitoneal adenopathy,  index left periaortic lymph node 1.2 cm in short axis on image 73/2, formerly 1.1 cm. Multiple additional retroperitoneal enlarged lymph nodes are present. There is bilateral common iliac and bilateral external iliac adenopathy, left external iliac node 1.8 cm in short axis on image 106/2, formerly 1.5 cm. Reproductive: Interval hysterectomy. Suspected oophorectomy. Soft tissue density on the vaginal cuff, residual tumor suspected. Other: Prior ascites has significantly improved. There is considerable tumor deposition in the remaining part of the upper omentum. Less bulky tumor in the lower omentum but the patient may have had partial omentectomy, correlate with patient's surgery. Omental tumor in the left upper quadrant for example on image 71/2 appear similar or minimally worsened from prior. Tumor deposits are also seen in the right upper quadrant and there is high density along the paracolic gutters suspicious for tumor. Diffuse mesenteric stranding is noted along with omental stranding. Considerable nodularity in the lower sigmoid mesocolon and in the perirectal space, likely worsened from previous. High density material in the space of Retzius  potentially from hematoma or tumor. Musculoskeletal: Tumor nodularity along the right piriformis muscle on image 105/2 extending towards the vaginal cuff through the perirectal space. Recent laparotomy. 9 mm sclerotic lesion in the left hip intertrochanteric region on image 122/2, likely incidental. I do not see compelling findings of osseous metastatic disease. IMPRESSION: 1. Interval hysterectomy and likely oophorectomy, and possibly partial omentectomy. There is infiltration and nodularity of the upper omentum compatible with tumor along with complex density along the paracolic gutters, space of Retzius, and presacral space which may also represent tumor infiltration. 2. Soft tissue density favoring tumor along the right posterior piriformis muscle extending towards the vaginal cuff and cul-de-sac region. This likely represents tumor. 3. Stable or minimally increased retroperitoneal and pelvic adenopathy. This is thought to be malignant. 4. Reduced ascites. 5. Mild bilateral hydronephrosis and proximal hydroureter extending down to the iliac vessel cross over is were there is stranding and some mild adenopathy. This likely represents low-grade partial ureteral obstruction and is most likely from extrinsic mass effect. 6. Aortic Atherosclerosis (ICD10-I70.0) and Emphysema (ICD10-J43.9). 7. Presacral and perirectal tumor nodularity. Electronically Signed   By: Van Clines M.D.   On: 11/20/2016 15:39   Ct Abdomen Pelvis W Contrast  Result Date: 11/20/2016 CLINICAL DATA:  Endometrial cancer diagnosed in May 2018. Hysterectomy 3 weeks ago. Abdominal pain and cramping. Back pain. EXAM: CT CHEST, ABDOMEN, AND PELVIS WITH CONTRAST TECHNIQUE: Multidetector CT imaging of the chest, abdomen and pelvis was performed following the standard protocol during bolus administration of intravenous contrast. CONTRAST:  166m ISOVUE-300 IOPAMIDOL (ISOVUE-300) INJECTION 61% COMPARISON:  Multiple exams, including 10/22/2016  FINDINGS: CT CHEST FINDINGS Cardiovascular: Unremarkable Mediastinum/Nodes: Unremarkable Lungs/Pleura: Centrilobular emphysema. Mild biapical pleuroparenchymal scarring. 3 mm right upper lobe pulmonary nodule on image 45/6, stable from 2006 and considered benign. Musculoskeletal: Unremarkable CT ABDOMEN PELVIS FINDINGS Hepatobiliary: Fluid density 1.4 by 1.7 cm lesion in segment 4 of the liver, image 57/2, stable. Pancreas: Punctate calcification in the head of the pancreas near the dorsal pancreatic duct on image 71/2, stable, otherwise normal. Spleen: Unremarkable Adrenals/Urinary Tract: Bilateral mild hydronephrosis and proximal hydroureter, terminating in the vicinity of the iliac vessel cross over where there is indistinct soft tissue density probably from indistinct nodal tissue contributing to extrinsic compression of the ureters. This is likely causing partial obstruction bilaterally. Adrenal glands unremarkable.  Distal ureters poorly seen. Stomach/Bowel: Mild prominence of stool in the colon. Vascular/Lymphatic: Mild aortoiliac atherosclerosis. Retroperitoneal adenopathy, index left periaortic  lymph node 1.2 cm in short axis on image 73/2, formerly 1.1 cm. Multiple additional retroperitoneal enlarged lymph nodes are present. There is bilateral common iliac and bilateral external iliac adenopathy, left external iliac node 1.8 cm in short axis on image 106/2, formerly 1.5 cm. Reproductive: Interval hysterectomy. Suspected oophorectomy. Soft tissue density on the vaginal cuff, residual tumor suspected. Other: Prior ascites has significantly improved. There is considerable tumor deposition in the remaining part of the upper omentum. Less bulky tumor in the lower omentum but the patient may have had partial omentectomy, correlate with patient's surgery. Omental tumor in the left upper quadrant for example on image 71/2 appear similar or minimally worsened from prior. Tumor deposits are also seen in the right  upper quadrant and there is high density along the paracolic gutters suspicious for tumor. Diffuse mesenteric stranding is noted along with omental stranding. Considerable nodularity in the lower sigmoid mesocolon and in the perirectal space, likely worsened from previous. High density material in the space of Retzius potentially from hematoma or tumor. Musculoskeletal: Tumor nodularity along the right piriformis muscle on image 105/2 extending towards the vaginal cuff through the perirectal space. Recent laparotomy. 9 mm sclerotic lesion in the left hip intertrochanteric region on image 122/2, likely incidental. I do not see compelling findings of osseous metastatic disease. IMPRESSION: 1. Interval hysterectomy and likely oophorectomy, and possibly partial omentectomy. There is infiltration and nodularity of the upper omentum compatible with tumor along with complex density along the paracolic gutters, space of Retzius, and presacral space which may also represent tumor infiltration. 2. Soft tissue density favoring tumor along the right posterior piriformis muscle extending towards the vaginal cuff and cul-de-sac region. This likely represents tumor. 3. Stable or minimally increased retroperitoneal and pelvic adenopathy. This is thought to be malignant. 4. Reduced ascites. 5. Mild bilateral hydronephrosis and proximal hydroureter extending down to the iliac vessel cross over is were there is stranding and some mild adenopathy. This likely represents low-grade partial ureteral obstruction and is most likely from extrinsic mass effect. 6. Aortic Atherosclerosis (ICD10-I70.0) and Emphysema (ICD10-J43.9). 7. Presacral and perirectal tumor nodularity. Electronically Signed   By: Van Clines M.D.   On: 11/20/2016 15:39   Ct Abdomen Pelvis W Contrast  Result Date: 10/22/2016 CLINICAL DATA:  Acute onset of lower pelvic pain, constipation, nausea and vomiting. Initial encounter. EXAM: CT ABDOMEN AND PELVIS WITH  CONTRAST TECHNIQUE: Multidetector CT imaging of the abdomen and pelvis was performed using the standard protocol following bolus administration of intravenous contrast. CONTRAST:  14m ISOVUE-300 IOPAMIDOL (ISOVUE-300) INJECTION 61% COMPARISON:  Abdominal radiograph performed 10/21/2016, and CT of the abdomen and pelvis from 04/19/2015 FINDINGS: Lower chest: The visualized lung bases are grossly clear. The visualized portions of the mediastinum are unremarkable. Hepatobiliary: A 1.6 cm cyst is noted at the medial right hepatic lobe. The liver is otherwise unremarkable. The gallbladder is grossly unremarkable in appearance. The common bile duct remains normal in caliber. Pancreas: The pancreas is within normal limits. Spleen: The spleen is unremarkable in appearance. Adrenals/Urinary Tract: The adrenal glands are unremarkable in appearance. The kidneys are within normal limits. There is no evidence of hydronephrosis. No renal or ureteral stones are identified. No perinephric stranding is seen. Stomach/Bowel: The appendix is normal in caliber. The colon is grossly unremarkable in appearance. Visualized small bowel loops are grossly unremarkable in appearance. The stomach is grossly unremarkable. Vascular/Lymphatic: Minimal calcification is noted along the aortic bifurcation. The inferior vena cava is grossly unremarkable. Mildly prominent retroperitoneal  nodes are seen. Enlarged bilateral pelvic sidewall nodes measure up to 1.6 cm in short axis. Diffuse peritoneal carcinomatosis is noted along the mid to lower abdomen, new from 2016. Mildly prominent nodes are noted about the left inguinal vessels. Reproductive: Numerous uterine fibroids are again noted, some of which have increased in size. Underlying malignancy cannot be excluded. Dilated bilateral fluid collections are again noted within the pelvis, the largest of which measures 15 cm in length. There is a complex partially cystic mass at the left adnexa,  measuring 5.9 x 5.5 x 4.3 cm. This may reflect a primary ovarian malignancy. Other: Small volume ascites is noted within the abdomen and pelvis. Musculoskeletal: No acute osseous abnormalities are identified. The visualized musculature is unremarkable in appearance. IMPRESSION: 1. 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 2. Complex partially cystic mass at the left adnexa, measuring 5.9 x 5.5 x 4.3 cm. This raises concern for primary ovarian malignancy. PET/CT could be considered for further evaluation. 3. Numerous uterine fibroids again noted, some which have increased in size. Underlying endometrial malignancy cannot be excluded. 4. Dilated bilateral fluid collections have reaccumulated within the pelvis, the largest of which measures 15 cm in length. 5. Enlarged bilateral pelvic sidewall nodes measure up to 1.6 cm in short axis, compatible with metastatic disease. Mildly prominent retroperitoneal nodes seen. Mildly prominent nodes about the left inguinal vessels. 6. 1.6 cm cyst at the medial right hepatic lobe. Electronically Signed   By: Garald Balding M.D.   On: 10/22/2016 21:26    ASSESSMENT & PLAN:  Endometrial cancer [facility]) The patient has very high risk disease. She is being seen urgently today with cancer associated pain. CT scan and blood work confirmed residual disease. We have extensive discussion about the urgency of the situation and the need to start treatment as soon as possible. The patient has declined port placement right now. We will reschedule chemo education class until next week We discussed the risks, benefits, side effects of chemotherapy with combination treatment with carboplatin and Taxol. We discussed the role of chemotherapy. The intent is of curative intent.  We discussed some of the risks, benefits, side-effects of carboplatin & Taxol  Some of the short term side-effects included,  though not limited to, including weight loss, life threatening infections, risk of allergic reactions, need for transfusions of blood products, nausea, vomiting, change in bowel habits, loss of hair, admission to hospital for various reasons, and risks of death.   Long term side-effects are also discussed including risks of infertility, permanent damage to nerve function, hearing loss, chronic fatigue, kidney damage with possibility needing hemodialysis, and rare secondary malignancy including bone marrow disorders.  The patient is aware that the response rates discussed earlier is not guaranteed.  After a long discussion, patient made an informed decision to proceed with the prescribed plan of care.  I have reminded her to take premedications with dexamethasone before treatment. Given her young age, I will omit G-CSF support for cycle 1 and observe to see whether this is actually needed. I plan to see her back prior to cycle 2 of treatment.  Elevated liver enzymes The cause is unknown.  We will observe only for now.  Cancer associated pain She has cancer associated pain. I have prescribed oxycodone to take as needed. I warned her about risk of constipation and nausea We discussed narcotic refill policy.  Other constipation She have signs of carcinomatosis and evidence of  constipation I recommend aggressive laxative therapy   Orders Placed This Encounter  Procedures  . CBC with Differential    Standing Status:   Standing    Number of Occurrences:   20    Standing Expiration Date:   11/21/2017  . Comprehensive metabolic panel    Standing Status:   Standing    Number of Occurrences:   20    Standing Expiration Date:   11/21/2017   All questions were answered. The patient knows to call the clinic with any problems, questions or concerns. No barriers to learning was detected. I spent 55 minutes counseling the patient face to face. The total time spent in the appointment was 60 minutes  and more than 50% was on counseling and review of test results     Heath Lark, MD 11/21/2016 7:33 AM

## 2016-11-21 NOTE — Assessment & Plan Note (Signed)
She have signs of carcinomatosis and evidence of constipation I recommend aggressive laxative therapy

## 2016-11-21 NOTE — Assessment & Plan Note (Signed)
The cause is unknown.  We will observe only for now.

## 2016-11-21 NOTE — Telephone Encounter (Signed)
Called with below message, instructed to call for questions. Attempted to call Cina, states mailbox full.

## 2016-11-22 ENCOUNTER — Ambulatory Visit: Payer: No Typology Code available for payment source | Admitting: Hematology and Oncology

## 2016-11-22 ENCOUNTER — Encounter: Payer: Self-pay | Admitting: Hematology and Oncology

## 2016-11-22 NOTE — Progress Notes (Signed)
Called patient to introduce myself as her Arboriculturist and to see if she had any financial questions or concerns. Verbally verified the coverage we have on file and she states that is correct. Asked patient if she is familiar with her plan and how it works and if she has a deductible/OOP. Patient states she does not know how it works or what will be covered. Advised patient I was trying to determine if she may need copay assistance to assist with the cost of her treatment particularly the Neulasta which can be expensive even after insurance pays. She asked if it is not covered by her insurance then what. Advised her that it would be ran through insurance and if a denial is received this information would be sent to [place] who handles off label application assitance in these cases. Advised her if they do cover, she may be eligible to apply for co-pay assistance through [employer] as long as her plan is commercial and they cover a portion of the cost. Patient will contact her insurance company to follow up and she has my name and number to follow back up if needed.

## 2016-11-23 ENCOUNTER — Encounter: Payer: Self-pay | Admitting: Hematology and Oncology

## 2016-11-23 NOTE — Progress Notes (Signed)
Patient called and left a voicemail crying and stating that her insurance is not going to cover her treatment.  Emailed Kerry Morris and Rob to inquire about patient's coverage and possible drug replacement. Kerry Morris called me to discuss and when she called the plan, they informed her no prior authorization was needed.  Advised patient I would research and meet with her Monday after chemo ed class.

## 2016-11-26 ENCOUNTER — Encounter: Payer: Self-pay | Admitting: Hematology and Oncology

## 2016-11-26 ENCOUNTER — Other Ambulatory Visit: Payer: No Typology Code available for payment source

## 2016-11-26 NOTE — Progress Notes (Signed)
Met with patient and spouse to discuss financial assistance. 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 Based on income status, approved patient for one-time $400 [employer] grant and [employer]. Patient given a copy of the award letter as well as the expense sheet along with the outpatient pharmacy information. Explained to patient that she may want to reserve most or all of the funds for oral medications if needed. Patient verbalized understanding. Patient has my card for any additional financial questions or concerns. ° °Called Rob to speak with patient regarding [place] and drug replacement. He came in to discuss and advise what may be needed.   °

## 2016-11-29 ENCOUNTER — Other Ambulatory Visit: Payer: Self-pay | Admitting: *Deleted

## 2016-11-30 ENCOUNTER — Other Ambulatory Visit: Payer: No Typology Code available for payment source

## 2016-11-30 ENCOUNTER — Ambulatory Visit (HOSPITAL_BASED_OUTPATIENT_CLINIC_OR_DEPARTMENT_OTHER): Payer: No Typology Code available for payment source

## 2016-11-30 VITALS — BP 143/87 | HR 89 | Temp 98.2°F | Resp 16

## 2016-11-30 DIAGNOSIS — Z5111 Encounter for antineoplastic chemotherapy: Secondary | ICD-10-CM | POA: Diagnosis not present

## 2016-11-30 DIAGNOSIS — C541 Malignant neoplasm of endometrium: Secondary | ICD-10-CM

## 2016-11-30 MED ORDER — FAMOTIDINE IN NACL 20-0.9 MG/50ML-% IV SOLN
20.0000 mg | Freq: Once | INTRAVENOUS | Status: AC
  Administered 2016-11-30: 20 mg via INTRAVENOUS

## 2016-11-30 MED ORDER — FAMOTIDINE IN NACL 20-0.9 MG/50ML-% IV SOLN
INTRAVENOUS | Status: AC
Start: 2016-11-30 — End: 2016-11-30
  Filled 2016-11-30: qty 50

## 2016-11-30 MED ORDER — SODIUM CHLORIDE 0.9 % IV SOLN
Freq: Once | INTRAVENOUS | Status: AC
  Administered 2016-11-30: 09:00:00 via INTRAVENOUS

## 2016-11-30 MED ORDER — PACLITAXEL CHEMO INJECTION 300 MG/50ML
175.0000 mg/m2 | Freq: Once | INTRAVENOUS | Status: AC
  Administered 2016-11-30: 258 mg via INTRAVENOUS
  Filled 2016-11-30: qty 43

## 2016-11-30 MED ORDER — DIPHENHYDRAMINE HCL 50 MG/ML IJ SOLN
INTRAMUSCULAR | Status: AC
  Filled 2016-11-30: qty 1

## 2016-11-30 MED ORDER — DIPHENHYDRAMINE HCL 50 MG/ML IJ SOLN
50.0000 mg | Freq: Once | INTRAMUSCULAR | Status: AC
  Administered 2016-11-30: 50 mg via INTRAVENOUS

## 2016-11-30 MED ORDER — SODIUM CHLORIDE 0.9 % IV SOLN
Freq: Once | INTRAVENOUS | Status: AC
  Administered 2016-11-30: 09:00:00 via INTRAVENOUS
  Filled 2016-11-30: qty 5

## 2016-11-30 MED ORDER — PALONOSETRON HCL INJECTION 0.25 MG/5ML
INTRAVENOUS | Status: AC
  Filled 2016-11-30: qty 5

## 2016-11-30 MED ORDER — PALONOSETRON HCL INJECTION 0.25 MG/5ML
0.2500 mg | Freq: Once | INTRAVENOUS | Status: AC
  Administered 2016-11-30: 0.25 mg via INTRAVENOUS

## 2016-11-30 MED ORDER — SODIUM CHLORIDE 0.9 % IV SOLN
523.8000 mg | Freq: Once | INTRAVENOUS | Status: AC
  Administered 2016-11-30: 520 mg via INTRAVENOUS
  Filled 2016-11-30: qty 52

## 2016-11-30 NOTE — Patient Instructions (Addendum)
[facility] Discharge Instructions for Patients Receiving Chemotherapy  Today you received the following chemotherapy agents :  Taxol, Carboplatin.  To help prevent nausea and vomiting after your treatment, we encourage you to take your nausea medication as prescribed.  Take Compazine 10 mg by mouth every 6 hours as needed for nausea.  Take Zofran 8 mg  Twice daily for nausea as needed -  Start on the  Third day after chemo since you have received  Aloxi here at the clinic today. DO NOT EAT GREASY  NOR  SPICY  FOODS.   DO DRINK LOTS OF FLUIDS AS TOLERATED.   If you develop nausea and vomiting that is not controlled by your nausea medication, call the clinic.   BELOW ARE SYMPTOMS THAT SHOULD BE REPORTED IMMEDIATELY:  *FEVER GREATER THAN 100.5 F  *CHILLS WITH OR WITHOUT FEVER  NAUSEA AND VOMITING THAT IS NOT CONTROLLED WITH YOUR NAUSEA MEDICATION  *UNUSUAL SHORTNESS OF BREATH  *UNUSUAL BRUISING OR BLEEDING  TENDERNESS IN MOUTH AND THROAT WITH OR WITHOUT PRESENCE OF ULCERS  *URINARY PROBLEMS  *BOWEL PROBLEMS  UNUSUAL RASH Items with * indicate a potential emergency and should be followed up as soon as possible.  Feel free to call the clinic you have any questions or concerns. The clinic phone number is [phone].  Please show the [place] at check-in to the Emergency Department and triage nurse.

## 2016-11-30 NOTE — Progress Notes (Signed)
Dr. Alvy Bimler notified of low grade temp pre Taxol.  OK to proceed as per Dr. Alvy Bimler.  Explanations given to pt.

## 2016-12-01 ENCOUNTER — Telehealth: Payer: Self-pay | Admitting: *Deleted

## 2016-12-01 NOTE — Telephone Encounter (Signed)
Unable to reach [place]

## 2016-12-01 NOTE — Telephone Encounter (Signed)
-----   Message from Jarvis Morgan, RN sent at 11/30/2016 11:03 AM EDT ----- Regarding: Chemo follow up First time Taxol, Carboplatin, Dr, NG, TB, RN.

## 2016-12-14 ENCOUNTER — Encounter: Payer: Self-pay | Admitting: Genetic Counselor

## 2016-12-14 ENCOUNTER — Telehealth: Payer: Self-pay | Admitting: Genetic Counselor

## 2016-12-14 NOTE — Telephone Encounter (Signed)
Genetic counseling appt has been scheduled for the pt to see Santiago Glad on 8/1 at 2pm. Pt aware to arrive 15 minutes early. Letter mailed to the pt

## 2016-12-20 ENCOUNTER — Ambulatory Visit (HOSPITAL_BASED_OUTPATIENT_CLINIC_OR_DEPARTMENT_OTHER): Payer: No Typology Code available for payment source | Admitting: Hematology and Oncology

## 2016-12-20 ENCOUNTER — Other Ambulatory Visit (HOSPITAL_BASED_OUTPATIENT_CLINIC_OR_DEPARTMENT_OTHER): Payer: No Typology Code available for payment source

## 2016-12-20 ENCOUNTER — Ambulatory Visit (HOSPITAL_BASED_OUTPATIENT_CLINIC_OR_DEPARTMENT_OTHER): Payer: No Typology Code available for payment source

## 2016-12-20 ENCOUNTER — Encounter: Payer: No Typology Code available for payment source | Admitting: Nutrition

## 2016-12-20 DIAGNOSIS — C541 Malignant neoplasm of endometrium: Secondary | ICD-10-CM

## 2016-12-20 DIAGNOSIS — K5909 Other constipation: Secondary | ICD-10-CM

## 2016-12-20 DIAGNOSIS — D701 Agranulocytosis secondary to cancer chemotherapy: Secondary | ICD-10-CM | POA: Insufficient documentation

## 2016-12-20 DIAGNOSIS — Z5111 Encounter for antineoplastic chemotherapy: Secondary | ICD-10-CM

## 2016-12-20 DIAGNOSIS — T451X5A Adverse effect of antineoplastic and immunosuppressive drugs, initial encounter: Secondary | ICD-10-CM

## 2016-12-20 LAB — CBC WITH DIFFERENTIAL/PLATELET
BASO%: 0.1 % (ref 0.0–2.0)
Basophils Absolute: 0 10*3/uL (ref 0.0–0.1)
EOS ABS: 0 10*3/uL (ref 0.0–0.5)
EOS%: 0 % (ref 0.0–7.0)
HCT: 35.3 % (ref 34.8–46.6)
HGB: 12.1 g/dL (ref 11.6–15.9)
LYMPH%: 15.2 % (ref 14.0–49.7)
MCH: 32.3 pg (ref 25.1–34.0)
MCHC: 34.3 g/dL (ref 31.5–36.0)
MCV: 94.1 fL (ref 79.5–101.0)
MONO#: 0 10*3/uL — AB (ref 0.1–0.9)
MONO%: 1.1 % (ref 0.0–14.0)
NEUT%: 83.6 % — AB (ref 38.4–76.8)
NEUTROS ABS: 2.3 10*3/uL (ref 1.5–6.5)
PLATELETS: 161 10*3/uL (ref 145–400)
RBC: 3.75 10*6/uL (ref 3.70–5.45)
RDW: 14 % (ref 11.2–14.5)
WBC: 2.8 10*3/uL — AB (ref 3.9–10.3)
lymph#: 0.4 10*3/uL — ABNORMAL LOW (ref 0.9–3.3)

## 2016-12-20 LAB — COMPREHENSIVE METABOLIC PANEL
ALK PHOS: 84 U/L (ref 40–150)
ALT: 25 U/L (ref 0–55)
ANION GAP: 11 meq/L (ref 3–11)
AST: 18 U/L (ref 5–34)
Albumin: 4 g/dL (ref 3.5–5.0)
BILIRUBIN TOTAL: 0.24 mg/dL (ref 0.20–1.20)
BUN: 17.8 mg/dL (ref 7.0–26.0)
CO2: 24 meq/L (ref 22–29)
Calcium: 10 mg/dL (ref 8.4–10.4)
Chloride: 102 mEq/L (ref 98–109)
Creatinine: 0.8 mg/dL (ref 0.6–1.1)
Glucose: 183 mg/dl — ABNORMAL HIGH (ref 70–140)
Potassium: 4.3 mEq/L (ref 3.5–5.1)
Sodium: 137 mEq/L (ref 136–145)
Total Protein: 8 g/dL (ref 6.4–8.3)

## 2016-12-20 MED ORDER — PACLITAXEL CHEMO INJECTION 300 MG/50ML
175.0000 mg/m2 | Freq: Once | INTRAVENOUS | Status: AC
  Administered 2016-12-20: 258 mg via INTRAVENOUS
  Filled 2016-12-20: qty 43

## 2016-12-20 MED ORDER — SODIUM CHLORIDE 0.9 % IV SOLN
Freq: Once | INTRAVENOUS | Status: AC
  Administered 2016-12-20: 12:00:00 via INTRAVENOUS
  Filled 2016-12-20: qty 5

## 2016-12-20 MED ORDER — SODIUM CHLORIDE 0.9 % IV SOLN
520.0000 mg | Freq: Once | INTRAVENOUS | Status: AC
  Administered 2016-12-20: 520 mg via INTRAVENOUS
  Filled 2016-12-20: qty 52

## 2016-12-20 MED ORDER — SODIUM CHLORIDE 0.9% FLUSH
10.0000 mL | INTRAVENOUS | Status: DC | PRN
Start: 2016-12-20 — End: 2016-12-20
  Filled 2016-12-20: qty 10

## 2016-12-20 MED ORDER — DIPHENHYDRAMINE HCL 50 MG/ML IJ SOLN
INTRAMUSCULAR | Status: AC
  Filled 2016-12-20: qty 1

## 2016-12-20 MED ORDER — FAMOTIDINE IN NACL 20-0.9 MG/50ML-% IV SOLN
20.0000 mg | Freq: Once | INTRAVENOUS | Status: AC
  Administered 2016-12-20: 20 mg via INTRAVENOUS

## 2016-12-20 MED ORDER — DIPHENHYDRAMINE HCL 50 MG/ML IJ SOLN
50.0000 mg | Freq: Once | INTRAMUSCULAR | Status: AC
  Administered 2016-12-20: 50 mg via INTRAVENOUS

## 2016-12-20 MED ORDER — SODIUM CHLORIDE 0.9 % IV SOLN
Freq: Once | INTRAVENOUS | Status: AC
  Administered 2016-12-20: 11:00:00 via INTRAVENOUS

## 2016-12-20 MED ORDER — PALONOSETRON HCL INJECTION 0.25 MG/5ML
INTRAVENOUS | Status: AC
  Filled 2016-12-20: qty 5

## 2016-12-20 MED ORDER — FAMOTIDINE IN NACL 20-0.9 MG/50ML-% IV SOLN
INTRAVENOUS | Status: AC
  Filled 2016-12-20: qty 50

## 2016-12-20 MED ORDER — PALONOSETRON HCL INJECTION 0.25 MG/5ML
0.2500 mg | Freq: Once | INTRAVENOUS | Status: AC
  Administered 2016-12-20: 0.25 mg via INTRAVENOUS

## 2016-12-20 NOTE — Assessment & Plan Note (Signed)
She has mild leukopenia but not neutropenic We will proceed with treatment without dose adjustment

## 2016-12-20 NOTE — Assessment & Plan Note (Signed)
She has minimum constipation, improved since chemo She will continue laxatives on a regular basis

## 2016-12-20 NOTE — Assessment & Plan Note (Signed)
She tolerated cycle 1 of chemotherapy very well except for some mild fatigue She has self discontinued all pain medicine She has gained some weight She denies mucositis on neuropathy We will proceed with full dose treatment without dose adjustment

## 2016-12-20 NOTE — Patient Instructions (Signed)
[facility] Discharge Instructions for Patients Receiving Chemotherapy  Today you received the following chemotherapy agents :  Taxol, Carboplatin.  To help prevent nausea and vomiting after your treatment, we encourage you to take your nausea medication as prescribed.  Take Compazine 10 mg by mouth every 6 hours as needed for nausea.  Take Zofran 8 mg  Twice daily for nausea as needed -  Start on the  Third day after chemo since you have received  Aloxi here at the clinic today. DO NOT EAT GREASY  NOR  SPICY  FOODS.   DO DRINK LOTS OF FLUIDS AS TOLERATED.   If you develop nausea and vomiting that is not controlled by your nausea medication, call the clinic.   BELOW ARE SYMPTOMS THAT SHOULD BE REPORTED IMMEDIATELY:  *FEVER GREATER THAN 100.5 F  *CHILLS WITH OR WITHOUT FEVER  NAUSEA AND VOMITING THAT IS NOT CONTROLLED WITH YOUR NAUSEA MEDICATION  *UNUSUAL SHORTNESS OF BREATH  *UNUSUAL BRUISING OR BLEEDING  TENDERNESS IN MOUTH AND THROAT WITH OR WITHOUT PRESENCE OF ULCERS  *URINARY PROBLEMS  *BOWEL PROBLEMS  UNUSUAL RASH Items with * indicate a potential emergency and should be followed up as soon as possible.  Feel free to call the clinic you have any questions or concerns. The clinic phone number is [phone].  Please show the [place] at check-in to the Emergency Department and triage nurse.

## 2016-12-20 NOTE — Progress Notes (Signed)
[facility] OFFICE PROGRESS NOTE  Patient Care Team: Patient, No Pcp Per as PCP - General (General Practice)  SUMMARY OF ONCOLOGIC HISTORY: Oncology History   MSI stable, Her 2 neg, ER patchy positivity     Endometrial cancer ([place])   04/05/2015 Imaging    CT abdomen:  1. Large bilateral hydrosalpinx/pyosalpinx. 2. Enlarged fibroid uterus. 3. Mild bilateral hydronephrosis from #1.       04/06/2015 Procedure    Status post CT-guided drainage of right and left tubo-ovarian abscess, with parallel drains from a right trans gluteal approach. Sample was sent from both the left and the right fluid collections to the lab.      04/19/2015 Imaging    CT abdomen and pelvis: The bilateral pelvic fluid collections have resolved following placement of the percutaneous drainage catheters. Small amount of fluid in the pelvis but no evidence for residual abscess collections or hydrosalpinx. Fibroid uterus. Innumerable uterine fibroids. Many of these fibroids are pedunculated. Development of a small left pleural effusion. Mild fat stranding in the abdominal and pelvic mesentery suggestive for mild edema      05/03/2015 Imaging    US abdomen and pelvis 1. Significantly regressed but not completely resolved bilateral hydrosalpinx/pyosalpinx. Residual is greater on the right (18 mm diameter) with mild fluid complexity. 2. Stable fibroid uterus. 3. No pelvic free fluid      07/06/2015 Pathology Results    PAP smear NEGATIVE FOR INTRAEPITHELIAL LESIONS OR MALIGNANCY.      07/18/2015 Pathology Results    Vulva, biopsy, mass - BENIGN EPIDERMAL CYST. - NO EVIDENCE OF MALIGNANCY      10/21/2016 Initial Diagnosis    She presented to the ER for severe abdominal pain      10/22/2016 Imaging    CT abdomen and pelvis 1. 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 2. Complex partially cystic mass at the left  adnexa, measuring 5.9 x 5.5 x 4.3 cm. This raises concern for primary ovarian malignancy. PET/CT could be considered for further evaluation. 3. Numerous uterine fibroids again noted, some which have increased in size. Underlying endometrial malignancy cannot be excluded. 4. Dilated bilateral fluid collections have reaccumulated within the pelvis, the largest of which measures 15 cm in length. 5. Enlarged bilateral pelvic sidewall nodes measure up to 1.6 cm in short axis, compatible with metastatic disease. Mildly prominent retroperitoneal nodes seen. Mildly prominent nodes about the left inguinal vessels. 6. 1.6 cm cyst at the medial right hepatic lobe.       10/29/2016 - 11/02/2016 Hospital Admission    She was admitted to [facility] for management of newly diagnosed endometrial cancer      10/29/2016 Tumor Marker    Patient's tumor was tested for the following markers: CA125 Results of the tumor marker test revealed 740      10/30/2016 Surgery    Procedure(s): - Open ureteral exploration - Cystourethroscopy - Left ureteral stent placement  Operative Findings:   1.) Normal cystourethrosocpy without lesions, masses or stones. Bilateral ureteral orifices identified with efflux of clear yellow urine 2.) Successful placement of left ureteral stent under visualization with appropriate curl visualized within the bladder and stent palpable within the ureter to the level of the renal pelvis 3) Open exploration determined ureter to be intact and viable.      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) - Carcinoma involves outer half of myometrium, serosal surface, and cervix - Bilateral ovaries / adnexa are positive for high grade serous carcinoma - Extensive lymphovascular space invasion is present - See  synoptic report and comment  B: "Free floating abdominal mass", removal  - Benign cyst with mesothelial-type lining, consistent with peritoneal inclusion cyst, size 7.0 cm - No malignancy identified   C: Posterior cul-de-sac mass, excision  - Involved by high grade serous carcinoma  D: Lymph nodes, left pelvic, resection  - Five of six lymph nodes involved by metastatic high grade serous carcinoma, size of metastases up to 1.5 cm, with no definite extracapsular extension identified (5/6)  E: Lymph nodes, right pelvic, excision  - Four of four lymph nodes involved by metastatic high grade serous carcinoma, size of metastases up to 1.6 cm, with no definite extracapsular extension identified (4/4)  F: Lymph nodes, right para-aortic, resection  - Three of four lymph nodes involved by metastatic high grade serous carcinoma, size of metastases up to 1.7 cm, with no definite extracapsular extension identified (3/4)  G: Omentum, omentectomy  - Involved by metastatic high grade serous carcinoma, size of nodules up to 2.9 cm   Given the presence of a large endometrial mass with serous endometrial intraepithelial carcinoma as well as deep myometrial invasion and cervical and serosal involvement, the tumor is staged as an endometrial primary. However, ovarian primary with spread to the uterus or synchronous primary tumors are also a diagnostic possibility, and definitive site of origin cannot be determined histologically. Extensive lymphovascular space invasion is present, and the tumor involves both ovaries, peritoneum, omentum, and multiple lymph nodes. No distinct fallopian tubes are identified grossly or microscopically; however, they may have been incorporated into the ovarian / adnexal tumor masses.  Immunohistochemical stains are performed on block A11 and demonstrate the tumor is positive for p53 and WT1 and is strongly diffusely positive for p16. It shows patchy focal staining for ER. WT1  staining is also positive in block A15, the right ovary. 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 Interval hysterectomy and likely oophorectomy, and possibly partial omentectomy. There is infiltration and nodularity of the upper omentum compatible with tumor along with complex density along the paracolic gutters, space of Retzius, and presacral space which may also represent tumor infiltration. 2. Soft tissue density favoring tumor along the right posterior piriformis muscle extending towards the vaginal cuff and cul-de-sac region. This likely represents tumor. 3. Stable or minimally increased retroperitoneal and pelvic adenopathy. This is thought to be malignant. 4. Reduced ascites. 5. Mild bilateral hydronephrosis and proximal hydroureter extending down to the iliac vessel cross over is were there is stranding and some mild adenopathy. This likely represents low-grade partial ureteral obstruction and is most likely from extrinsic mass effect. 6. Aortic Atherosclerosis (ICD10-I70.0) and Emphysema (ICD10-J43.9). 7. Presacral and perirectal tumor nodularity.      11/20/2016 Tumor Marker    Patient's tumor was tested for the following markers: CA-125. Results of the tumor marker test revealed 154.2.      11/29/2016 Genetic Testing    Patient has genetic testing done for MSI instability Results revealed patient has no MSI instability      11/30/2016 -  Chemotherapy    She received carboplatin & Taxol  INTERVAL HISTORY: Please see below for problem oriented charting. She returns with her husband for cycle 2 of treatment She feels well She has minimum pain She has self discontinued all pain medicine Constipation is improving She had no nausea or peripheral neuropathy from treatment  REVIEW OF SYSTEMS:   Constitutional: Denies fevers, chills or abnormal weight loss Eyes:  Denies blurriness of vision Ears, nose, mouth, throat, and face: Denies mucositis or sore throat Respiratory: Denies cough, dyspnea or wheezes Cardiovascular: Denies palpitation, chest discomfort or lower extremity swelling Gastrointestinal:  Denies nausea, heartburn or change in bowel habits Skin: Denies abnormal skin rashes Lymphatics: Denies new lymphadenopathy or easy bruising Neurological:Denies numbness, tingling or new weaknesses Behavioral/Psych: Mood is stable, no new changes  All other systems were reviewed with the patient and are negative.  I have reviewed the past medical history, past surgical history, social history and family history with the patient and they are unchanged from previous note.  ALLERGIES:  has No Known Allergies.  MEDICATIONS:  Current Outpatient Prescriptions  Medication Sig Dispense Refill  . acetaminophen (TYLENOL) 325 MG tablet Take 650 mg by mouth every 6 (six) hours as needed.    . ALPRAZolam (XANAX) 0.5 MG tablet Take 0.5 mg by mouth 3 (three) times daily as needed for anxiety.    Marland Kitchen dexamethasone (DECADRON) 4 MG tablet Take 5 tablets at midnight and 5 tablets at 6 am the morning of treatment, by mouth, every 21 days 30 tablet 1  . ondansetron (ZOFRAN) 8 MG tablet Take 1 tablet (8 mg total) by mouth 2 (two) times daily as needed for refractory nausea / vomiting. Start on day 3 after chemo. 30 tablet 1  . oxyCODONE (OXY IR/ROXICODONE) 5 MG immediate release tablet Take 1 tablet (5 mg total) by mouth every 4 (four) hours as needed for severe pain. 60 tablet 0  . prochlorperazine (COMPAZINE) 10 MG tablet Take 1 tablet (10 mg total) by mouth every 6 (six) hours as needed (Nausea or vomiting). 30 tablet 1  . simethicone (MYLICON) 80 MG chewable tablet Chew 80 mg by mouth 4 (four) times daily as needed.     No current facility-administered medications for this visit.    Facility-Administered Medications Ordered in Other Visits  Medication Dose Route  Frequency Provider Last Rate Last Dose  . CARBOplatin (PARAPLATIN) 520 mg in sodium chloride 0.9 % 250 mL chemo infusion  520 mg Intravenous Once Alvy Bimler, Navon Kotowski, MD      . PACLitaxel (TAXOL) 258 mg in dextrose 5 % 250 mL chemo infusion (> '80mg'$ /m2)  175 mg/m2 (Treatment Plan Recorded) Intravenous Once Heath Lark, MD 98 mL/hr at 12/20/16 1221 258 mg at 12/20/16 1221  . sodium chloride flush (NS) 0.9 % injection 10 mL  10 mL Intracatheter PRN Alvy Bimler, [name] Shenk, MD        PHYSICAL EXAMINATION: ECOG PERFORMANCE STATUS: 1 - Symptomatic but completely ambulatory  Vitals:   12/20/16 0943  BP: 121/76  Pulse: 94  Resp: 18  Temp: 98.1 F (36.7 C)   Filed Weights   12/20/16 0943  Weight: 108 lb 12.8 oz (49.4 kg)    GENERAL:alert, no distress and comfortable SKIN: skin color, texture, turgor are normal, no rashes or significant lesions EYES: normal, Conjunctiva are pink and non-injected, sclera clear OROPHARYNX:no exudate, no erythema and lips, buccal mucosa, and tongue normal  NECK: supple, thyroid normal size, non-tender, without nodularity LYMPH:  no palpable lymphadenopathy in the cervical, axillary or inguinal LUNGS: clear to auscultation and percussion  with normal breathing effort HEART: regular rate & rhythm and no murmurs and no lower extremity edema ABDOMEN:abdomen soft, non-tender and normal bowel sounds Musculoskeletal:no cyanosis of digits and no clubbing  NEURO: alert & oriented x 3 with fluent speech, no focal motor/sensory deficits  LABORATORY DATA:  I have reviewed the data as listed    Component Value Date/Time   NA 137 12/20/2016 0927   K 4.3 12/20/2016 0927   CL 101 10/22/2016 2224   CO2 24 12/20/2016 0927   GLUCOSE 183 (H) 12/20/2016 0927   BUN 17.8 12/20/2016 0927   CREATININE 0.8 12/20/2016 0927   CALCIUM 10.0 12/20/2016 0927   PROT 8.0 12/20/2016 0927   ALBUMIN 4.0 12/20/2016 0927   AST 18 12/20/2016 0927   ALT 25 12/20/2016 0927   ALKPHOS 84 12/20/2016 0927    BILITOT 0.24 12/20/2016 0927   GFRNONAA >60 10/22/2016 2224   GFRAA >60 10/22/2016 2224    No results found for: SPEP, UPEP  Lab Results  Component Value Date   WBC 2.8 (L) 12/20/2016   NEUTROABS 2.3 12/20/2016   HGB 12.1 12/20/2016   HCT 35.3 12/20/2016   MCV 94.1 12/20/2016   PLT 161 12/20/2016      Chemistry      Component Value Date/Time   NA 137 12/20/2016 0927   K 4.3 12/20/2016 0927   CL 101 10/22/2016 2224   CO2 24 12/20/2016 0927   BUN 17.8 12/20/2016 0927   CREATININE 0.8 12/20/2016 0927      Component Value Date/Time   CALCIUM 10.0 12/20/2016 0927   ALKPHOS 84 12/20/2016 0927   AST 18 12/20/2016 0927   ALT 25 12/20/2016 0927   BILITOT 0.24 12/20/2016 0927       ASSESSMENT & PLAN:  Endometrial cancer ([place]) She tolerated cycle 1 of chemotherapy very well except for some mild fatigue She has self discontinued all pain medicine She has gained some weight She denies mucositis on neuropathy We will proceed with full dose treatment without dose adjustment  Leukopenia due to antineoplastic chemotherapy ([place]) She has mild leukopenia but not neutropenic We will proceed with treatment without dose adjustment  Other constipation She has minimum constipation, improved since chemo She will continue laxatives on a regular basis   No orders of the defined types were placed in this encounter.  All questions were answered. The patient knows to call the clinic with any problems, questions or concerns. No barriers to learning was detected. I spent 15 minutes counseling the patient face to face. The total time spent in the appointment was 20 minutes and more than 50% was on counseling and review of test results     Heath Lark, MD 12/20/2016 1:08 PM

## 2016-12-26 ENCOUNTER — Other Ambulatory Visit: Payer: No Typology Code available for payment source

## 2016-12-26 ENCOUNTER — Ambulatory Visit (HOSPITAL_BASED_OUTPATIENT_CLINIC_OR_DEPARTMENT_OTHER): Payer: No Typology Code available for payment source | Admitting: Genetics

## 2016-12-26 DIAGNOSIS — C541 Malignant neoplasm of endometrium: Secondary | ICD-10-CM

## 2016-12-26 DIAGNOSIS — Z7183 Encounter for nonprocreative genetic counseling: Secondary | ICD-10-CM

## 2016-12-26 DIAGNOSIS — Z808 Family history of malignant neoplasm of other organs or systems: Secondary | ICD-10-CM

## 2016-12-26 DIAGNOSIS — Z806 Family history of leukemia: Secondary | ICD-10-CM

## 2016-12-27 ENCOUNTER — Encounter: Payer: Self-pay | Admitting: Genetics

## 2016-12-27 NOTE — Progress Notes (Signed)
REFERRING PROVIDER: Heath Lark, MD [place], [place] [address]  PRIMARY PROVIDER:  Patient, No Pcp Per  PRIMARY REASON FOR VISIT:  1. Endometrial cancer ([place])      HISTORY OF PRESENT ILLNESS:   Kerry Morris, a 51 y.o. female, was seen for a [place] cancer genetics consultation at the request of Dr. Alvy Bimler due to a personal history of cancer.  Kerry Morris presents to clinic today to discuss the possibility of a hereditary predisposition to cancer, genetic testing, and to further clarify her future cancer risks, as well as potential cancer risks for family members.   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 Of note, Kerry Morris's pathology report states the following: "Given the presence of a large endometrial mass with serous endometrial intraepithelial carcinoma as well as deep myometrial invasion and cervical and serosal involvement, the tumor is staged as an endometrial primary. However, ovarian primary with spread to the uterus or synchronous primary tumors are also a diagnostic possibility, and definitive site of origin cannot be determined histologically." Her tumor is MSI-stable.   She is currently undergoing chemotherapy. In November 2016, she underwent CT-guided drainage of right and left tubo-ovarian abscesses. A CT of her abdomen and pelvis shortly after noted innumberable uterine fibroids.   CANCER HISTORY:  Oncology History   MSI stable, Her 2 neg, ER patchy positivity     Endometrial cancer ([place])   04/05/2015 Imaging    CT abdomen:  1. Large bilateral hydrosalpinx/pyosalpinx. 2. Enlarged fibroid uterus. 3. Mild bilateral hydronephrosis from #1.       04/06/2015 Procedure    Status post CT-guided drainage of right and left tubo-ovarian abscess, with parallel drains from a right trans gluteal approach. Sample was sent from both the left and the right fluid collections to the lab.       04/19/2015 Imaging    CT abdomen and pelvis: The bilateral pelvic fluid collections have resolved following placement of the percutaneous drainage catheters. Small amount of fluid in the pelvis but no evidence for residual abscess collections or hydrosalpinx. Fibroid uterus. Innumerable uterine fibroids. Many of these fibroids are pedunculated. Development of a small left pleural effusion. Mild fat stranding in the abdominal and pelvic mesentery suggestive for mild edema      05/03/2015 Imaging    US abdomen and pelvis 1. Significantly regressed but not completely resolved bilateral hydrosalpinx/pyosalpinx. Residual is greater on the right (18 mm diameter) with mild fluid complexity. 2. Stable fibroid uterus. 3. No pelvic free fluid      07/06/2015 Pathology Results    PAP smear NEGATIVE FOR INTRAEPITHELIAL LESIONS OR MALIGNANCY.      07/18/2015 Pathology Results    Vulva, biopsy, mass - BENIGN EPIDERMAL CYST. - NO EVIDENCE OF MALIGNANCY      10/21/2016 Initial Diagnosis    She presented to the ER for severe abdominal pain      10/22/2016 Imaging    CT abdomen and pelvis 1. 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 2. Complex partially cystic mass at the left adnexa, measuring 5.9 x 5.5 x 4.3 cm. This raises concern for primary ovarian malignancy. PET/CT could be considered for further evaluation. 3. Numerous uterine fibroids again noted, some which have increased in size. Underlying endometrial malignancy cannot be excluded. 4. Dilated bilateral fluid collections have reaccumulated within the pelvis, the largest of which measures  15 cm in length. 5. Enlarged bilateral pelvic sidewall nodes measure up to 1.6 cm in short axis, compatible with metastatic disease. Mildly prominent retroperitoneal nodes seen. Mildly prominent nodes about the left inguinal vessels. 6. 1.6 cm cyst at the medial right  hepatic lobe.       10/29/2016 - 11/02/2016 Hospital Admission    She was admitted to [facility] for management of newly diagnosed endometrial cancer      10/29/2016 Tumor Marker    Patient's tumor was tested for the following markers: CA125 Results of the tumor marker test revealed 740      10/30/2016 Surgery    Procedure(s): - Open ureteral exploration - Cystourethroscopy - Left ureteral stent placement  Operative Findings:   1.) Normal cystourethrosocpy without lesions, masses or stones. Bilateral ureteral orifices identified with efflux of clear yellow urine 2.) Successful placement of left ureteral stent under visualization with appropriate curl visualized within the bladder and stent palpable within the ureter to the level of the renal pelvis 3) Open exploration determined ureter to be intact and viable.      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) - Carcinoma involves outer half of myometrium, serosal surface, and cervix - Bilateral ovaries / adnexa are positive for high grade serous carcinoma - Extensive lymphovascular space invasion is present - See synoptic report and comment  B: "Free floating abdominal mass", removal  - Benign cyst with mesothelial-type lining, consistent with peritoneal inclusion cyst, size 7.0 cm - No malignancy identified   C: Posterior cul-de-sac mass, excision  - Involved by high grade serous carcinoma  D: Lymph nodes, left pelvic, resection  - Five of six lymph nodes involved by metastatic high grade serous carcinoma, size of metastases up to 1.5 cm, with no definite extracapsular extension identified (5/6)  E: Lymph nodes, right pelvic, excision  - Four of four lymph nodes involved by metastatic high grade serous carcinoma, size of metastases up to 1.6 cm,  with no definite extracapsular extension identified (4/4)  F: Lymph nodes, right para-aortic, resection  - Three of four lymph nodes involved by metastatic high grade serous carcinoma, size of metastases up to 1.7 cm, with no definite extracapsular extension identified (3/4)  G: Omentum, omentectomy  - Involved by metastatic high grade serous carcinoma, size of nodules up to 2.9 cm   Given the presence of a large endometrial mass with serous endometrial intraepithelial carcinoma as well as deep myometrial invasion and cervical and serosal involvement, the tumor is staged as an endometrial primary. However, ovarian primary with spread to the uterus or synchronous primary tumors are also a diagnostic possibility, and definitive site of origin cannot be determined histologically. Extensive lymphovascular space invasion is present, and the tumor involves both ovaries, peritoneum, omentum, and multiple lymph nodes. No distinct fallopian tubes are identified grossly or microscopically; however, they may have been incorporated into the ovarian / adnexal tumor masses.  Immunohistochemical stains are performed on block A11 and demonstrate the tumor is positive for p53 and WT1 and is strongly diffusely positive for p16. It shows patchy focal staining for ER. WT1 staining is also positive in block A15, the right ovary. 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  Interval hysterectomy and likely oophorectomy, and possibly partial omentectomy. There is infiltration and nodularity of the upper omentum compatible with tumor along with complex density along the paracolic gutters, space of Retzius, and presacral space which may also represent tumor infiltration. 2. Soft tissue density favoring tumor along the right posterior piriformis muscle extending towards the vaginal cuff and cul-de-sac region. This likely  represents tumor. 3. Stable or minimally increased retroperitoneal and pelvic adenopathy. This is thought to be malignant. 4. Reduced ascites. 5. Mild bilateral hydronephrosis and proximal hydroureter extending down to the iliac vessel cross over is were there is stranding and some mild adenopathy. This likely represents low-grade partial ureteral obstruction and is most likely from extrinsic mass effect. 6. Aortic Atherosclerosis (ICD10-I70.0) and Emphysema (ICD10-J43.9). 7. Presacral and perirectal tumor nodularity.      11/20/2016 Tumor Marker    Patient's tumor was tested for the following markers: CA-125. Results of the tumor marker test revealed 154.2.      11/29/2016 Genetic Testing    Patient has genetic testing done for MSI instability Results revealed patient has no MSI instability      11/30/2016 -  Chemotherapy    She received carboplatin & Taxol        Past Medical History:  Diagnosis Date  . Anxiety   . Anxiety and depression   . Depression     Past Surgical History:  Procedure Laterality Date  . CERVICAL BIOPSY  W/ LOOP ELECTRODE EXCISION    . DENTAL SURGERY      Social History   Social History  . Marital status: Married    Spouse name: Kirtland Bouchard  . Number of children: 0  . Years of education: N/A   Occupational History  . 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 Smoking status: Former Smoker    Packs/day: 1.00    Years: 25.00    Types: Cigarettes    Quit date: 03/28/2015  . Smokeless tobacco: Never Used  . Alcohol use No     Comment: Previous h/o alcoholism, sober x 8 years  . Drug use: Yes    Types: Marijuana     Comment: remote h/o IVDU, has been worked up for parenterally transmitted infections multiple times since  . Sexual activity: Yes    Partners: Male   Other Topics Concern  . Not on file   Social History Narrative   Lives with husband who is also her business partner. They own and run a bar/club. Recent financial  difficulties have been a significant source of stress for them. In addition, patient is grieving the loss of a close family member/friend in Oct 2016. As a result, Gay reports poor diet for the past several months with notable unintentional weight loss.     FAMILY HISTORY:  We obtained a detailed, 4-generation family history.  Significant diagnoses are listed below: Family History  Problem Relation Age of Onset  . Leukemia Mother 107       in remission  . Throat cancer Maternal Aunt        d.>50  . Alcohol abuse Maternal Uncle        d.>50   Kerry Morris has no children. She has two maternal half-sisters, ages 47 and 51. Both sisters are without cancers. Kerry Morris's mother was diagnosed with leukemia in her late-70s and is currently in remission at age 12. Kerry Morris had a maternal aunt and maternal uncle. Her aunt died with throat cancer  after age 26. Her uncle died after age 77 from health problems associated with alcohol abuse. Both of Kerry Morris's maternal grandparents died after age 57 and neither had a history of cancer. Kerry Morris does not know who her biological father is. Therefore, there is no available information regarding paternal family history.  Kerry Morris is unaware of previous family history of genetic testing for hereditary cancer risks. Patient's maternal ancestors are of Caucasian descent. She states that as a child, family members referred to her as "mixed" but she does not know what that means in terms of her paternal ethnicity/ancestry. There is no reported Ashkenazi Jewish ancestry. There is no known consanguinity.  GENETIC COUNSELING ASSESSMENT: Abia Monaco is a 51 y.o. female with a personal history of endometrial cancer, though pathology notes possible ovarian origin. Given the uncertainty regarding pathology and Kerry Morris's unknown paternal family history, we feel that genetic testing for hereditary cancer risk is indicated. Furthermore, innumerable uterine fibroids  could be suggestive of heritable mutations in the FH gene. We, therefore, discussed and recommended the following at today's visit.   DISCUSSION: We reviewed the characteristics, features and inheritance patterns of hereditary cancer syndromes. We also discussed genetic testing, including the appropriate family members to test, the process of testing, insurance coverage and turn-around-time for results. We discussed the implications of a negative, positive and/or variant of uncertain significant result. We recommended Kerry Morris pursue genetic testing for the Multi-Cancer Panel offered by [facility]. [facility]'s Multi-Cancer Panel includes analysis of the following 83 genes: ALK, APC, ATM, AXIN2, BAP1, BARD1, BLM, BMPR1A, BRCA1, BRCA2, BRIP1, CASR, CDC73, CDH1, CDK4, CDKN1B, CDKN1C, CDKN2A, CEBPA, CHEK2, CTNNA1, DICER1, DIS3L2, EGFR, EPCAM, FH, FLCN, GATA2, GPC3, GREM1, HOXB13, HRAS, KIT, MAX, MEN1, MET, MITF, MLH1, MSH2, MSH3, MSH6, MUTYH, NBN, NF1, NF2, NTHL1, PALB2, PDGFRA, PHOX2B, PMS2, POLD1, POLE, POT1, PRKAR1A, PTCH1, PTEN, RAD50, RAD51C, RAD51D, RB1, RECQL4, RET, RUNX1, SDHA, SDHAF2, SDHB, SDHC, SDHD, SMAD4, SMARCA4, SMARCB1, SMARCE1, STK11, SUFU, TERC, TERT, TMEM127, TP53, TSC1, TSC2, VHL, WRN, WT1.  It is unclear whether Kerry Morris's genetic testing will be covered by her insurance. Her insurance information was submitted to [facility] along with a financial assistance application. We discussed that if her out of pocket cost for testing is over $100, the laboratory will call and confirm whether she wants to proceed with testing.  If the out of pocket cost of testing is less than $100 she will be billed by the genetic testing laboratory.   PLAN: After considering the risks, benefits, and limitations, Kerry Morris  provided informed consent to pursue genetic testing and the blood sample was sent to [facility] for analysis of the 83-gene Multi-Cancers Panel. Results should be available within  approximately 3 weeks' time, at which point they will be disclosed by telephone to Kerry Morris, as will any additional recommendations warranted by these results. This information will also be available in Epic.   Lastly, we encouraged Kerry Morris to remain in contact with cancer genetics annually so that we can continuously update the family history and inform her of any changes in cancer genetics and testing that may be of benefit for this family.   Ms.  Morris's questions were answered to her satisfaction today. Our contact information was provided should additional questions or concerns arise. Thank you for the referral and allowing [place] to share in the care of your patient.   Mal Misty, MS, [facility] Certified Naval [email or web address] phone: [phone]  The patient was seen for a total of  40 minutes in face-to-face genetic counseling.    _______________________________________________________________________ For Office Staff:  Number of people involved in session: 1 Was an Intern/ student involved with case: no

## 2017-01-10 ENCOUNTER — Encounter: Payer: Self-pay | Admitting: Genetics

## 2017-01-10 ENCOUNTER — Telehealth: Payer: Self-pay | Admitting: Genetics

## 2017-01-10 ENCOUNTER — Ambulatory Visit: Payer: No Typology Code available for payment source

## 2017-01-10 ENCOUNTER — Ambulatory Visit: Payer: Self-pay | Admitting: Genetics

## 2017-01-10 ENCOUNTER — Ambulatory Visit (HOSPITAL_BASED_OUTPATIENT_CLINIC_OR_DEPARTMENT_OTHER): Payer: No Typology Code available for payment source | Admitting: Hematology and Oncology

## 2017-01-10 ENCOUNTER — Encounter: Payer: Self-pay | Admitting: Hematology and Oncology

## 2017-01-10 ENCOUNTER — Telehealth: Payer: Self-pay | Admitting: Hematology and Oncology

## 2017-01-10 ENCOUNTER — Other Ambulatory Visit (HOSPITAL_BASED_OUTPATIENT_CLINIC_OR_DEPARTMENT_OTHER): Payer: No Typology Code available for payment source

## 2017-01-10 ENCOUNTER — Ambulatory Visit (HOSPITAL_BASED_OUTPATIENT_CLINIC_OR_DEPARTMENT_OTHER): Payer: No Typology Code available for payment source

## 2017-01-10 VITALS — HR 97

## 2017-01-10 DIAGNOSIS — D701 Agranulocytosis secondary to cancer chemotherapy: Secondary | ICD-10-CM

## 2017-01-10 DIAGNOSIS — C541 Malignant neoplasm of endometrium: Secondary | ICD-10-CM

## 2017-01-10 DIAGNOSIS — Z1379 Encounter for other screening for genetic and chromosomal anomalies: Secondary | ICD-10-CM

## 2017-01-10 DIAGNOSIS — T451X5A Adverse effect of antineoplastic and immunosuppressive drugs, initial encounter: Secondary | ICD-10-CM

## 2017-01-10 DIAGNOSIS — R110 Nausea: Secondary | ICD-10-CM | POA: Diagnosis not present

## 2017-01-10 DIAGNOSIS — Z5111 Encounter for antineoplastic chemotherapy: Secondary | ICD-10-CM

## 2017-01-10 HISTORY — DX: Encounter for other screening for genetic and chromosomal anomalies: Z13.79

## 2017-01-10 LAB — CBC WITH DIFFERENTIAL/PLATELET
BASO%: 0 % (ref 0.0–2.0)
BASOS ABS: 0 10*3/uL (ref 0.0–0.1)
EOS ABS: 0 10*3/uL (ref 0.0–0.5)
EOS%: 0 % (ref 0.0–7.0)
HCT: 40.7 % (ref 34.8–46.6)
HEMOGLOBIN: 13.8 g/dL (ref 11.6–15.9)
LYMPH%: 18.2 % (ref 14.0–49.7)
MCH: 32.4 pg (ref 25.1–34.0)
MCHC: 33.9 g/dL (ref 31.5–36.0)
MCV: 95.5 fL (ref 79.5–101.0)
MONO#: 0 10*3/uL — AB (ref 0.1–0.9)
MONO%: 0.4 % (ref 0.0–14.0)
NEUT%: 81.4 % — ABNORMAL HIGH (ref 38.4–76.8)
NEUTROS ABS: 2.3 10*3/uL (ref 1.5–6.5)
PLATELETS: 186 10*3/uL (ref 145–400)
RBC: 4.26 10*6/uL (ref 3.70–5.45)
RDW: 16.1 % — ABNORMAL HIGH (ref 11.2–14.5)
WBC: 2.8 10*3/uL — ABNORMAL LOW (ref 3.9–10.3)
lymph#: 0.5 10*3/uL — ABNORMAL LOW (ref 0.9–3.3)

## 2017-01-10 LAB — COMPREHENSIVE METABOLIC PANEL
ALT: 18 U/L (ref 0–55)
AST: 16 U/L (ref 5–34)
Albumin: 4.3 g/dL (ref 3.5–5.0)
Alkaline Phosphatase: 96 U/L (ref 40–150)
Anion Gap: 15 mEq/L — ABNORMAL HIGH (ref 3–11)
BILIRUBIN TOTAL: 0.24 mg/dL (ref 0.20–1.20)
BUN: 16.9 mg/dL (ref 7.0–26.0)
CO2: 23 meq/L (ref 22–29)
CREATININE: 0.8 mg/dL (ref 0.6–1.1)
Calcium: 10.9 mg/dL — ABNORMAL HIGH (ref 8.4–10.4)
Chloride: 100 mEq/L (ref 98–109)
EGFR: 82 mL/min/{1.73_m2} — ABNORMAL LOW (ref >90)
GLUCOSE: 211 mg/dL — AB (ref 70–140)
Potassium: 4.4 mEq/L (ref 3.5–5.1)
Sodium: 137 mEq/L (ref 136–145)
TOTAL PROTEIN: 9.1 g/dL — AB (ref 6.4–8.3)

## 2017-01-10 MED ORDER — SODIUM CHLORIDE 0.9 % IV SOLN
Freq: Once | INTRAVENOUS | Status: AC
  Administered 2017-01-10: 11:00:00 via INTRAVENOUS
  Filled 2017-01-10: qty 5

## 2017-01-10 MED ORDER — SODIUM CHLORIDE 0.9 % IV SOLN
Freq: Once | INTRAVENOUS | Status: AC
  Administered 2017-01-10: 11:00:00 via INTRAVENOUS

## 2017-01-10 MED ORDER — SODIUM CHLORIDE 0.9 % IJ SOLN
10.0000 mL | Freq: Once | INTRAMUSCULAR | Status: AC
  Administered 2017-01-10: 10 mL via INTRAVENOUS
  Filled 2017-01-10: qty 10

## 2017-01-10 MED ORDER — SODIUM CHLORIDE 0.9 % IV SOLN
175.0000 mg/m2 | Freq: Once | INTRAVENOUS | Status: AC
  Administered 2017-01-10: 258 mg via INTRAVENOUS
  Filled 2017-01-10: qty 43

## 2017-01-10 MED ORDER — DIPHENHYDRAMINE HCL 50 MG/ML IJ SOLN
INTRAMUSCULAR | Status: AC
  Filled 2017-01-10: qty 1

## 2017-01-10 MED ORDER — DIPHENHYDRAMINE HCL 50 MG/ML IJ SOLN
50.0000 mg | Freq: Once | INTRAMUSCULAR | Status: AC
  Administered 2017-01-10: 50 mg via INTRAVENOUS

## 2017-01-10 MED ORDER — FAMOTIDINE IN NACL 20-0.9 MG/50ML-% IV SOLN
INTRAVENOUS | Status: AC
  Filled 2017-01-10: qty 50

## 2017-01-10 MED ORDER — PALONOSETRON HCL INJECTION 0.25 MG/5ML
INTRAVENOUS | Status: AC
  Filled 2017-01-10: qty 5

## 2017-01-10 MED ORDER — FAMOTIDINE IN NACL 20-0.9 MG/50ML-% IV SOLN
20.0000 mg | Freq: Once | INTRAVENOUS | Status: AC
  Administered 2017-01-10: 20 mg via INTRAVENOUS

## 2017-01-10 MED ORDER — PALONOSETRON HCL INJECTION 0.25 MG/5ML
0.2500 mg | Freq: Once | INTRAVENOUS | Status: AC
  Administered 2017-01-10: 0.25 mg via INTRAVENOUS

## 2017-01-10 MED ORDER — SODIUM CHLORIDE 0.9 % IV SOLN
520.0000 mg | Freq: Once | INTRAVENOUS | Status: AC
  Administered 2017-01-10: 520 mg via INTRAVENOUS
  Filled 2017-01-10: qty 52

## 2017-01-10 MED ORDER — DEXAMETHASONE 4 MG PO TABS: ORAL_TABLET | ORAL | 1 refills | Status: DC

## 2017-01-10 MED FILL — DEXAMETHASONE 4 MG TABLET: 4 | 63 days supply | Qty: 30 | Fill #0

## 2017-01-10 NOTE — Assessment & Plan Note (Signed)
She tolerated treatment very well except for leukopenia and mild nausea Clinically, she is gaining weight and her bowel habits are regular There is no doubt in my mind that she is having positive response to treatment I will proceed with treatment without dose adjustment She will get repeat imaging study after 3 cycles of chemotherapy

## 2017-01-10 NOTE — Progress Notes (Signed)
[facility] OFFICE PROGRESS NOTE  Patient Care Team: Patient, No Pcp Per as PCP - General (General Practice)  SUMMARY OF ONCOLOGIC HISTORY: Oncology History   MSI stable, Her 2 neg, ER patchy positivity     Endometrial cancer ([place])   04/05/2015 Imaging    CT abdomen:  1. Large bilateral hydrosalpinx/pyosalpinx. 2. Enlarged fibroid uterus. 3. Mild bilateral hydronephrosis from #1.       04/06/2015 Procedure    Status post CT-guided drainage of right and left tubo-ovarian abscess, with parallel drains from a right trans gluteal approach. Sample was sent from both the left and the right fluid collections to the lab.      04/19/2015 Imaging    CT abdomen and pelvis: The bilateral pelvic fluid collections have resolved following placement of the percutaneous drainage catheters. Small amount of fluid in the pelvis but no evidence for residual abscess collections or hydrosalpinx. Fibroid uterus. Innumerable uterine fibroids. Many of these fibroids are pedunculated. Development of a small left pleural effusion. Mild fat stranding in the abdominal and pelvic mesentery suggestive for mild edema      05/03/2015 Imaging    US abdomen and pelvis 1. Significantly regressed but not completely resolved bilateral hydrosalpinx/pyosalpinx. Residual is greater on the right (18 mm diameter) with mild fluid complexity. 2. Stable fibroid uterus. 3. No pelvic free fluid      07/06/2015 Pathology Results    PAP smear NEGATIVE FOR INTRAEPITHELIAL LESIONS OR MALIGNANCY.      07/18/2015 Pathology Results    Vulva, biopsy, mass - BENIGN EPIDERMAL CYST. - NO EVIDENCE OF MALIGNANCY      10/21/2016 Initial Diagnosis    She presented to the ER for severe abdominal pain      10/22/2016 Imaging    CT abdomen and pelvis 1. 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 2. Complex partially cystic mass at the left  adnexa, measuring 5.9 x 5.5 x 4.3 cm. This raises concern for primary ovarian malignancy. PET/CT could be considered for further evaluation. 3. Numerous uterine fibroids again noted, some which have increased in size. Underlying endometrial malignancy cannot be excluded. 4. Dilated bilateral fluid collections have reaccumulated within the pelvis, the largest of which measures 15 cm in length. 5. Enlarged bilateral pelvic sidewall nodes measure up to 1.6 cm in short axis, compatible with metastatic disease. Mildly prominent retroperitoneal nodes seen. Mildly prominent nodes about the left inguinal vessels. 6. 1.6 cm cyst at the medial right hepatic lobe.       10/29/2016 - 11/02/2016 Hospital Admission    She was admitted to [facility] for management of newly diagnosed endometrial cancer      10/29/2016 Tumor Marker    Patient's tumor was tested for the following markers: CA125 Results of the tumor marker test revealed 740      10/30/2016 Surgery    Procedure(s): - Open ureteral exploration - Cystourethroscopy - Left ureteral stent placement  Operative Findings:   1.) Normal cystourethrosocpy without lesions, masses or stones. Bilateral ureteral orifices identified with efflux of clear yellow urine 2.) Successful placement of left ureteral stent under visualization with appropriate curl visualized within the bladder and stent palpable within the ureter to the level of the renal pelvis 3) Open exploration determined ureter to be intact and viable.      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) - Carcinoma involves outer half of myometrium, serosal surface, and cervix - Bilateral ovaries / adnexa are positive for high grade serous carcinoma - Extensive lymphovascular space invasion is present - See  synoptic report and comment  B: "Free floating abdominal mass", removal  - Benign cyst with mesothelial-type lining, consistent with peritoneal inclusion cyst, size 7.0 cm - No malignancy identified   C: Posterior cul-de-sac mass, excision  - Involved by high grade serous carcinoma  D: Lymph nodes, left pelvic, resection  - Five of six lymph nodes involved by metastatic high grade serous carcinoma, size of metastases up to 1.5 cm, with no definite extracapsular extension identified (5/6)  E: Lymph nodes, right pelvic, excision  - Four of four lymph nodes involved by metastatic high grade serous carcinoma, size of metastases up to 1.6 cm, with no definite extracapsular extension identified (4/4)  F: Lymph nodes, right para-aortic, resection  - Three of four lymph nodes involved by metastatic high grade serous carcinoma, size of metastases up to 1.7 cm, with no definite extracapsular extension identified (3/4)  G: Omentum, omentectomy  - Involved by metastatic high grade serous carcinoma, size of nodules up to 2.9 cm   Given the presence of a large endometrial mass with serous endometrial intraepithelial carcinoma as well as deep myometrial invasion and cervical and serosal involvement, the tumor is staged as an endometrial primary. However, ovarian primary with spread to the uterus or synchronous primary tumors are also a diagnostic possibility, and definitive site of origin cannot be determined histologically. Extensive lymphovascular space invasion is present, and the tumor involves both ovaries, peritoneum, omentum, and multiple lymph nodes. No distinct fallopian tubes are identified grossly or microscopically; however, they may have been incorporated into the ovarian / adnexal tumor masses.  Immunohistochemical stains are performed on block A11 and demonstrate the tumor is positive for p53 and WT1 and is strongly diffusely positive for p16. It shows patchy focal staining for ER. WT1  staining is also positive in block A15, the right ovary. 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 Interval hysterectomy and likely oophorectomy, and possibly partial omentectomy. There is infiltration and nodularity of the upper omentum compatible with tumor along with complex density along the paracolic gutters, space of Retzius, and presacral space which may also represent tumor infiltration. 2. Soft tissue density favoring tumor along the right posterior piriformis muscle extending towards the vaginal cuff and cul-de-sac region. This likely represents tumor. 3. Stable or minimally increased retroperitoneal and pelvic adenopathy. This is thought to be malignant. 4. Reduced ascites. 5. Mild bilateral hydronephrosis and proximal hydroureter extending down to the iliac vessel cross over is were there is stranding and some mild adenopathy. This likely represents low-grade partial ureteral obstruction and is most likely from extrinsic mass effect. 6. Aortic Atherosclerosis (ICD10-I70.0) and Emphysema (ICD10-J43.9). 7. Presacral and perirectal tumor nodularity.      11/20/2016 Tumor Marker    Patient's tumor was tested for the following markers: CA-125. Results of the tumor marker test revealed 154.2.      11/29/2016 Genetic Testing    Patient has genetic testing done for MSI instability Results revealed patient has no MSI instability      11/30/2016 -  Chemotherapy    She received carboplatin & Taxol  INTERVAL HISTORY: Please see below for problem oriented charting. She is seen prior to cycle 3 of chemotherapy She had some mild nausea but no vomiting with recent treatment Her constipation has resolved She denies abdominal pain No recent infection Denies peripheral neuropathy Overall, she tolerated treatment very well  REVIEW OF SYSTEMS:   Constitutional: Denies fevers,  chills or abnormal weight loss Eyes: Denies blurriness of vision Ears, nose, mouth, throat, and face: Denies mucositis or sore throat Respiratory: Denies cough, dyspnea or wheezes Cardiovascular: Denies palpitation, chest discomfort or lower extremity swelling Skin: Denies abnormal skin rashes Lymphatics: Denies new lymphadenopathy or easy bruising Neurological:Denies numbness, tingling or new weaknesses Behavioral/Psych: Mood is stable, no new changes  All other systems were reviewed with the patient and are negative.  I have reviewed the past medical history, past surgical history, social history and family history with the patient and they are unchanged from previous note.  ALLERGIES:  has No Known Allergies.  MEDICATIONS:  Current Outpatient Prescriptions  Medication Sig Dispense Refill  . acetaminophen (TYLENOL) 325 MG tablet Take 650 mg by mouth every 6 (six) hours as needed.    . ALPRAZolam (XANAX) 0.5 MG tablet Take 0.5 mg by mouth 3 (three) times daily as needed for anxiety.    Marland Kitchen dexamethasone (DECADRON) 4 MG tablet Take 5 tablets at midnight and 5 tablets at 6 am the morning of treatment, by mouth, every 21 days 30 tablet 1  . ondansetron (ZOFRAN) 8 MG tablet Take 1 tablet (8 mg total) by mouth 2 (two) times daily as needed for refractory nausea / vomiting. Start on day 3 after chemo. 30 tablet 1  . oxyCODONE (OXY IR/ROXICODONE) 5 MG immediate release tablet Take 1 tablet (5 mg total) by mouth every 4 (four) hours as needed for severe pain. 60 tablet 0  . prochlorperazine (COMPAZINE) 10 MG tablet Take 1 tablet (10 mg total) by mouth every 6 (six) hours as needed (Nausea or vomiting). 30 tablet 1  . simethicone (MYLICON) 80 MG chewable tablet Chew 80 mg by mouth 4 (four) times daily as needed.     No current facility-administered medications for this visit.    Facility-Administered Medications Ordered in Other Visits  Medication Dose Route Frequency Provider Last Rate Last Dose   . CARBOplatin (PARAPLATIN) 520 mg in sodium chloride 0.9 % 250 mL chemo infusion  520 mg Intravenous Once Alvy Bimler, Charnetta Wulff, MD      . PACLitaxel (TAXOL) 258 mg in sodium chloride 0.9 % 250 mL chemo infusion (> 37m/m2)  175 mg/m2 (Treatment Plan Recorded) Intravenous Once GHeath Lark MD 98 mL/hr at 01/10/17 1158 258 mg at 01/10/17 1158    PHYSICAL EXAMINATION: ECOG PERFORMANCE STATUS: 1 - Symptomatic but completely ambulatory  Vitals:   01/10/17 1012  BP: 125/80  Pulse: (!) 112  Resp: 18  Temp: 97.9 F (36.6 C)  SpO2: 97%   Filed Weights   01/10/17 1012  Weight: 111 lb 12.8 oz (50.7 kg)    GENERAL:alert, no distress and comfortable SKIN: skin color, texture, turgor are normal, no rashes or significant lesions EYES: normal, Conjunctiva are pink and non-injected, sclera clear OROPHARYNX:no exudate, no erythema and lips, buccal mucosa, and tongue normal  NECK: supple, thyroid normal size, non-tender, without nodularity LYMPH:  no palpable lymphadenopathy in the cervical, axillary or inguinal LUNGS: clear to auscultation and percussion with normal breathing effort HEART: regular rate & rhythm and no murmurs and no lower extremity edema ABDOMEN:abdomen soft, non-tender and normal bowel sounds  Musculoskeletal:no cyanosis of digits and no clubbing  NEURO: alert & oriented x 3 with fluent speech, no focal motor/sensory deficits  LABORATORY DATA:  I have reviewed the data as listed    Component Value Date/Time   NA 137 01/10/2017 0907   K 4.4 01/10/2017 0907   CL 101 10/22/2016 2224   CO2 23 01/10/2017 0907   GLUCOSE 211 (H) 01/10/2017 0907   BUN 16.9 01/10/2017 0907   CREATININE 0.8 01/10/2017 0907   CALCIUM 10.9 (H) 01/10/2017 0907   PROT 9.1 (H) 01/10/2017 0907   ALBUMIN 4.3 01/10/2017 0907   AST 16 01/10/2017 0907   ALT 18 01/10/2017 0907   ALKPHOS 96 01/10/2017 0907   BILITOT 0.24 01/10/2017 0907   GFRNONAA >60 10/22/2016 2224   GFRAA >60 10/22/2016 2224    No results  found for: SPEP, UPEP  Lab Results  Component Value Date   WBC 2.8 (L) 01/10/2017   NEUTROABS 2.3 01/10/2017   HGB 13.8 01/10/2017   HCT 40.7 01/10/2017   MCV 95.5 01/10/2017   PLT 186 01/10/2017      Chemistry      Component Value Date/Time   NA 137 01/10/2017 0907   K 4.4 01/10/2017 0907   CL 101 10/22/2016 2224   CO2 23 01/10/2017 0907   BUN 16.9 01/10/2017 0907   CREATININE 0.8 01/10/2017 0907      Component Value Date/Time   CALCIUM 10.9 (H) 01/10/2017 0907   ALKPHOS 96 01/10/2017 0907   AST 16 01/10/2017 0907   ALT 18 01/10/2017 0907   BILITOT 0.24 01/10/2017 0907       ASSESSMENT & PLAN:  Endometrial cancer ([place]) She tolerated treatment very well except for leukopenia and mild nausea Clinically, she is gaining weight and her bowel habits are regular There is no doubt in my mind that she is having positive response to treatment I will proceed with treatment without dose adjustment She will get repeat imaging study after 3 cycles of chemotherapy  Leukopenia due to antineoplastic chemotherapy ([place]) She has mild leukopenia but not neutropenic We will proceed with treatment without dose adjustment  Chemotherapy-induced nausea She has mild nausea due to chemotherapy I recommend she takes antiemetics as needed.   Orders Placed This Encounter  Procedures  . CT Abdomen Pelvis W Contrast    Standing Status:   Future    Standing Expiration Date:   01/10/2018    Order Specific Question:   If indicated for the ordered procedure, I authorize the administration of contrast media per Radiology protocol    Answer:   Yes    Order Specific Question:   Is patient pregnant?    Answer:   No    Order Specific Question:   Preferred imaging location?    Answer:   [facility]    Order Specific Question:   Radiology Contrast Protocol - do NOT remove file path    Answer:   [email or web address]   All questions were answered. The patient knows  to call the clinic with any problems, questions or concerns. No barriers to learning was detected. I spent 15 minutes counseling the patient face to face. The total time spent in the appointment was 20 minutes and more than 50% was on counseling and review of test results     Heath Lark, MD 01/10/2017 12:01 PM

## 2017-01-10 NOTE — Patient Instructions (Signed)
[facility] Discharge Instructions for Patients Receiving Chemotherapy  Today you received the following chemotherapy agents :  Taxol, Carboplatin.  To help prevent nausea and vomiting after your treatment, we encourage you to take your nausea medication as prescribed.  Take Compazine 10 mg by mouth every 6 hours as needed for nausea.  Take Zofran 8 mg  Twice daily for nausea as needed -  Start on the  Third day after chemo since you have received  Aloxi here at the clinic today. DO NOT EAT GREASY  NOR  SPICY  FOODS.   DO DRINK LOTS OF FLUIDS AS TOLERATED.   If you develop nausea and vomiting that is not controlled by your nausea medication, call the clinic.   BELOW ARE SYMPTOMS THAT SHOULD BE REPORTED IMMEDIATELY:  *FEVER GREATER THAN 100.5 F  *CHILLS WITH OR WITHOUT FEVER  NAUSEA AND VOMITING THAT IS NOT CONTROLLED WITH YOUR NAUSEA MEDICATION  *UNUSUAL SHORTNESS OF BREATH  *UNUSUAL BRUISING OR BLEEDING  TENDERNESS IN MOUTH AND THROAT WITH OR WITHOUT PRESENCE OF ULCERS  *URINARY PROBLEMS  *BOWEL PROBLEMS  UNUSUAL RASH Items with * indicate a potential emergency and should be followed up as soon as possible.  Feel free to call the clinic you have any questions or concerns. The clinic phone number is [phone].  Please show the [employer] at check-in to the Emergency Department and triage nurse.

## 2017-01-10 NOTE — Assessment & Plan Note (Signed)
She has mild nausea due to chemotherapy I recommend she takes antiemetics as needed.

## 2017-01-10 NOTE — Telephone Encounter (Signed)
Reviewed that germline genetic testing revealed no pathogenic mutations. This is considered to be a negative result. Testing was performed through [facility]'s 83-gene Multi-Cancers Panel. [facility]'s Multi-Cancer Panel includes analysis of the following 83 genes: ALK, APC, ATM, AXIN2, BAP1, BARD1, BLM, BMPR1A, BRCA1, BRCA2, BRIP1, CASR, CDC73, CDH1, CDK4, CDKN1B, CDKN1C, CDKN2A, CEBPA, CHEK2, CTNNA1, DICER1, DIS3L2, EGFR, EPCAM, FH, FLCN, GATA2, GPC3, GREM1, HOXB13, HRAS, KIT, MAX, MEN1, MET, MITF, MLH1, MSH2, MSH3, MSH6, MUTYH, NBN, NF1, NF2, NTHL1, PALB2, PDGFRA, PHOX2B, PMS2, POLD1, POLE, POT1, PRKAR1A, PTCH1, PTEN, RAD50, RAD51C, RAD51D, RB1, RECQL4, RET, RUNX1, SDHA, SDHAF2, SDHB, SDHC, SDHD, SMAD4, SMARCA4, SMARCB1, SMARCE1, STK11, SUFU, TERC, TERT, TMEM127, TP53, TSC1, TSC2, VHL, WRN, WT1.  The following variants of uncertain significance (VUS) were noted: ATM c.692A>G (p.His231Arg) GPC3 c.1255G>A (p.Asp419Asn)  Discussed that these VUSs should not change clinical management.  For more detailed discussion, please see genetic counseling documentation from 01/10/2017. Result report dated 01/04/2017.

## 2017-01-10 NOTE — Progress Notes (Signed)
HPI: [name] was previously seen in the [place] clinic on 12/26/2016 due to a personal history of cancer and concerns regarding a hereditary predisposition to cancer. Please refer to our prior cancer genetics clinic note for more information regarding [name]'s medical, social and family histories, and our assessment and recommendations, at the time. [name]'s recent genetic test results were disclosed to her, as were recommendations warranted by these results. These results and recommendations are discussed in more detail below.  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 Of note, [name]'s pathology report states the following: "Given the presence of a large endometrial mass with serous endometrial intraepithelial carcinoma as well as deep myometrial invasion and cervical and serosal involvement, the tumor is staged as an endometrial primary. However, ovarian primary with spread to the uterus or synchronous primary tumors are also a diagnostic possibility, and definitive site of origin cannot be determined histologically." Her tumor is MSI-stable.   She is currently undergoing chemotherapy. In November 2016, she underwent CT-guided drainage of right and left tubo-ovarian abscesses. A CT of her abdomen and pelvis shortly after noted innumberable uterine fibroids.   Oncology History   MSI stable, Her 2 neg, ER patchy positivity     Endometrial cancer ([place])   04/05/2015 Imaging    CT abdomen:  1. Large bilateral hydrosalpinx/pyosalpinx. 2. Enlarged fibroid uterus. 3. Mild bilateral hydronephrosis from #1.       04/06/2015 Procedure    Status post CT-guided drainage of right and left tubo-ovarian abscess, with parallel drains from a right trans gluteal approach. Sample was sent from both the left and the right fluid collections to the lab.      04/19/2015 Imaging    CT abdomen and  pelvis: The bilateral pelvic fluid collections have resolved following placement of the percutaneous drainage catheters. Small amount of fluid in the pelvis but no evidence for residual abscess collections or hydrosalpinx. Fibroid uterus. Innumerable uterine fibroids. Many of these fibroids are pedunculated. Development of a small left pleural effusion. Mild fat stranding in the abdominal and pelvic mesentery suggestive for mild edema      05/03/2015 Imaging    US abdomen and pelvis 1. Significantly regressed but not completely resolved bilateral hydrosalpinx/pyosalpinx. Residual is greater on the right (18 mm diameter) with mild fluid complexity. 2. Stable fibroid uterus. 3. No pelvic free fluid      07/06/2015 Pathology Results    PAP smear NEGATIVE FOR INTRAEPITHELIAL LESIONS OR MALIGNANCY.      07/18/2015 Pathology Results    Vulva, biopsy, mass - BENIGN EPIDERMAL CYST. - NO EVIDENCE OF MALIGNANCY      10/21/2016 Initial Diagnosis    She presented to the ER for severe abdominal pain      10/22/2016 Imaging    CT abdomen and pelvis 1. 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 2. Complex partially cystic mass at the left adnexa, measuring 5.9 x 5.5 x 4.3 cm. This raises concern for primary ovarian malignancy. PET/CT could be considered for further evaluation. 3. Numerous uterine fibroids again noted, some which have increased in size. Underlying endometrial malignancy cannot be excluded. 4. Dilated bilateral fluid collections have reaccumulated within the pelvis, the largest of which measures 15 cm in length. 5. Enlarged bilateral pelvic sidewall nodes measure up to 1.6 cm in short axis, compatible with metastatic disease.  Mildly prominent retroperitoneal nodes seen. Mildly prominent nodes about the left inguinal vessels. 6. 1.6 cm cyst at the medial right hepatic lobe.       10/29/2016 - 11/02/2016  Hospital Admission    She was admitted to [facility] for management of newly diagnosed endometrial cancer      10/29/2016 Tumor Marker    Patient's tumor was tested for the following markers: CA125 Results of the tumor marker test revealed 740      10/30/2016 Surgery    Procedure(s): - Open ureteral exploration - Cystourethroscopy - Left ureteral stent placement  Operative Findings:   1.) Normal cystourethrosocpy without lesions, masses or stones. Bilateral ureteral orifices identified with efflux of clear yellow urine 2.) Successful placement of left ureteral stent under visualization with appropriate curl visualized within the bladder and stent palpable within the ureter to the level of the renal pelvis 3) Open exploration determined ureter to be intact and viable.      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) - Carcinoma involves outer half of myometrium, serosal surface, and cervix - Bilateral ovaries / adnexa are positive for high grade serous carcinoma - Extensive lymphovascular space invasion is present - See synoptic report and comment  B: "Free floating abdominal mass", removal  - Benign cyst with mesothelial-type lining, consistent with peritoneal inclusion cyst, size 7.0 cm - No malignancy identified   C: Posterior cul-de-sac mass, excision  - Involved by high grade serous carcinoma  D: Lymph nodes, left pelvic, resection  - Five of six lymph nodes involved by metastatic high grade serous carcinoma, size of metastases up to 1.5 cm, with no definite extracapsular extension identified (5/6)  E: Lymph nodes, right pelvic, excision  - Four of four lymph nodes involved by metastatic high grade serous carcinoma, size of metastases up to 1.6 cm, with no definite extracapsular extension  identified (4/4)  F: Lymph nodes, right para-aortic, resection  - Three of four lymph nodes involved by metastatic high grade serous carcinoma, size of metastases up to 1.7 cm, with no definite extracapsular extension identified (3/4)  G: Omentum, omentectomy  - Involved by metastatic high grade serous carcinoma, size of nodules up to 2.9 cm   Given the presence of a large endometrial mass with serous endometrial intraepithelial carcinoma as well as deep myometrial invasion and cervical and serosal involvement, the tumor is staged as an endometrial primary. However, ovarian primary with spread to the uterus or synchronous primary tumors are also a diagnostic possibility, and definitive site of origin cannot be determined histologically. Extensive lymphovascular space invasion is present, and the tumor involves both ovaries, peritoneum, omentum, and multiple lymph nodes. No distinct fallopian tubes are identified grossly or microscopically; however, they may have been incorporated into the ovarian / adnexal tumor masses.  Immunohistochemical stains are performed on block A11 and demonstrate the tumor is positive for p53 and WT1 and is strongly diffusely positive for p16. It shows patchy focal staining for ER. WT1 staining is also positive in block A15, the right ovary. 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 Interval hysterectomy and likely oophorectomy, and possibly partial omentectomy. There is infiltration and nodularity of the upper omentum compatible with tumor along  with complex density along the paracolic gutters, space of Retzius, and presacral space which may also represent tumor infiltration. 2. Soft tissue density favoring tumor along the right posterior piriformis muscle extending towards the vaginal cuff and cul-de-sac region. This likely represents tumor. 3. Stable or minimally  increased retroperitoneal and pelvic adenopathy. This is thought to be malignant. 4. Reduced ascites. 5. Mild bilateral hydronephrosis and proximal hydroureter extending down to the iliac vessel cross over is were there is stranding and some mild adenopathy. This likely represents low-grade partial ureteral obstruction and is most likely from extrinsic mass effect. 6. Aortic Atherosclerosis (ICD10-I70.0) and Emphysema (ICD10-J43.9). 7. Presacral and perirectal tumor nodularity.      11/20/2016 Tumor Marker    Patient's tumor was tested for the following markers: CA-125. Results of the tumor marker test revealed 154.2.      11/29/2016 Genetic Testing    Patient has genetic testing done for MSI instability Results revealed patient has no MSI instability      11/30/2016 -  Chemotherapy    She received carboplatin & Taxol         FAMILY HISTORY:  We obtained a detailed, 4-generation family history.  Significant diagnoses are listed below: Family History  Problem Relation Age of Onset  . Leukemia Mother 67       in remission  . Throat cancer Maternal Aunt        d.>50  . Alcohol abuse Maternal Uncle        d.>50   [name] has no children. She has two maternal half-sisters, ages 60 and 58. Both sisters are without cancers. [name]'s mother was diagnosed with leukemia in her late-70s and is currently in remission at age 36. [name] had a maternal aunt and maternal uncle. Her aunt died with throat cancer after age 67. Her uncle died after age 19 from health problems associated with alcohol abuse. Both of [name]'s maternal grandparents died after age 42 and neither had a history of cancer. [name] does not know who her biological father is. Therefore, there is no available information regarding paternal family history.  [name] is unaware of previous family history of genetic testing for hereditary cancer risks. Patient's maternal ancestors are of Caucasian descent. She  states that as a child, family members referred to her as "mixed" but she does not know what that means in terms of her paternal ethnicity/ancestry. There is no reported Ashkenazi Jewish ancestry. There is no known consanguinity.  GENETIC TEST RESULTS: Genetic testing performed through [facility] Multi-Cancers Panel reported out on 01/04/2017 showed no pathogenic mutations. [facility] Multi-Cancer Panel includes analysis of the following 83 genes: ALK, APC, ATM, AXIN2, BAP1, BARD1, BLM, BMPR1A, BRCA1, BRCA2, BRIP1, CASR, CDC73, CDH1, CDK4, CDKN1B, CDKN1C, CDKN2A, CEBPA, CHEK2, CTNNA1, DICER1, DIS3L2, EGFR, EPCAM, FH, FLCN, GATA2, GPC3, GREM1, HOXB13, HRAS, KIT, MAX, MEN1, MET, MITF, MLH1, MSH2, MSH3, MSH6, MUTYH, NBN, NF1, NF2, NTHL1, PALB2, PDGFRA, PHOX2B, PMS2, POLD1, POLE, POT1, PRKAR1A, PTCH1, PTEN, RAD50, RAD51C, RAD51D, RB1, RECQL4, RET, RUNX1, SDHA, SDHAF2, SDHB, SDHC, SDHD, SMAD4, SMARCA4, SMARCB1, SMARCE1, STK11, SUFU, TERC, TERT, TMEM127, TP53, TSC1, TSC2, VHL, WRN, WT1.  Variants of uncertain significance (VUS) called ATM c.692A>G (p.His231Arg) and GPC3 c.1255G>A (p.Asp419Asn) were also noted. At this time, it is unknown if either of these variants are associated with increased cancer risk or if they represent normal findings, but most variants such as these get reclassified to being inconsequential. Neither should be used to make medical management decisions.  With time, we suspect the lab will determine the significance of these variants, if any. If we do learn more about either variant, we will try to contact [name] to discuss it further. However, it is important to stay in touch with [place] periodically and keep the address and phone number up to date.  The test report will be scanned into EPIC and will be located under the Molecular Pathology section of the Results Review tab.A portion of the result report is included below for reference.     We discussed with [name] that since the  current genetic testing is not perfect, it is possible there may be a gene mutation in one of these genes that current testing cannot detect, but that chance is small. We also discussed, that it is possible that another gene that has not yet been discovered, or that we have not yet tested, is responsible for the cancer diagnoses in the family. Therefore, important to remain in touch with cancer genetics in the future so that we can continue to offer [name] the most up to date genetic testing.   CANCER SCREENING RECOMMENDATIONS: This result indicates that it is unlikely [name] has an increased risk for a future cancer due to a mutation in one of these genes. Because no causative or actionable mutations were identified, it is recommended she continue to follow the cancer management and screening guidelines provided by her oncology and primary healthcare providers.   RECOMMENDATIONS FOR FAMILY MEMBERS: Each of [name]'s family members should discuss their family history of cancers with their physicians to established a personalized cancer screening plan. However, there is no genetic testing recommended for her relatives at this time. Currently, there are no established screening guidelines for individuals with a family history of endometrial cancer. If [name]'s female relatives would like to consider screening for endometrial cancer, they should consult their physicians regarding options.  FOLLOW-UP: Lastly, we discussed with [name] that cancer genetics is a rapidly advancing field and it is possible that new genetic tests will be appropriate for her and/or her family members in the future. We encouraged her to remain in contact with cancer genetics on an annual basis so we can update her personal and family histories and let her know of advances in cancer genetics that may benefit this family.   Our contact number was provided. [name]'s questions were answered to her satisfaction, and  she knows she is welcome to call us at anytime with additional questions or concerns.   Mal Misty, MS, [facility] Certified Naval [email or web address]

## 2017-01-10 NOTE — Assessment & Plan Note (Signed)
She has mild leukopenia but not neutropenic We will proceed with treatment without dose adjustment

## 2017-01-10 NOTE — Telephone Encounter (Signed)
Scheduled appt per 8/16 los - Gave patient AVS and calender per los.  

## 2017-01-11 ENCOUNTER — Telehealth: Payer: Self-pay

## 2017-01-11 LAB — CA 125: CANCER ANTIGEN (CA) 125: 23.5 U/mL (ref 0.0–38.1)

## 2017-01-11 NOTE — Telephone Encounter (Signed)
Left message regarding below message. Instructed to call for questions.

## 2017-01-11 NOTE — Telephone Encounter (Signed)
-----   Message from Heath Lark, MD sent at 01/11/2017  6:48 AM EDT ----- Regarding: labs pls let her know tumor marker already back to nromal range ----- Message ----- From: Interface, Lab In Three Zero One Sent: 01/10/2017  10:06 AM To: Heath Lark, MD

## 2017-01-30 ENCOUNTER — Ambulatory Visit (HOSPITAL_COMMUNITY)
Admission: RE | Admit: 2017-01-30 | Discharge: 2017-01-30 | Disposition: A | Discharge status: Home / Self Care | Payer: No Typology Code available for payment source | Source: Ambulatory Visit | Attending: Hematology and Oncology | Admitting: Hematology and Oncology

## 2017-01-30 ENCOUNTER — Other Ambulatory Visit (HOSPITAL_BASED_OUTPATIENT_CLINIC_OR_DEPARTMENT_OTHER): Payer: Self-pay

## 2017-01-30 DIAGNOSIS — I700 Atherosclerosis of aorta: Secondary | ICD-10-CM | POA: Insufficient documentation

## 2017-01-30 DIAGNOSIS — C541 Malignant neoplasm of endometrium: Secondary | ICD-10-CM

## 2017-01-30 DIAGNOSIS — N2889 Other specified disorders of kidney and ureter: Secondary | ICD-10-CM | POA: Insufficient documentation

## 2017-01-30 LAB — CBC WITH DIFFERENTIAL/PLATELET
BASO%: 0.3 % (ref 0.0–2.0)
BASOS ABS: 0 10*3/uL (ref 0.0–0.1)
EOS ABS: 0 10*3/uL (ref 0.0–0.5)
EOS%: 0.9 % (ref 0.0–7.0)
HEMATOCRIT: 39.2 % (ref 34.8–46.6)
HEMOGLOBIN: 13.1 g/dL (ref 11.6–15.9)
LYMPH#: 2 10*3/uL (ref 0.9–3.3)
LYMPH%: 63.2 % — ABNORMAL HIGH (ref 14.0–49.7)
MCH: 32.8 pg (ref 25.1–34.0)
MCHC: 33.4 g/dL (ref 31.5–36.0)
MCV: 98 fL (ref 79.5–101.0)
MONO#: 0.4 10*3/uL (ref 0.1–0.9)
MONO%: 13.4 % (ref 0.0–14.0)
NEUT#: 0.7 10*3/uL — ABNORMAL LOW (ref 1.5–6.5)
NEUT%: 22.2 % — ABNORMAL LOW (ref 38.4–76.8)
PLATELETS: 163 10*3/uL (ref 145–400)
RBC: 4 10*6/uL (ref 3.70–5.45)
RDW: 16.7 % — AB (ref 11.2–14.5)
WBC: 3.2 10*3/uL — ABNORMAL LOW (ref 3.9–10.3)

## 2017-01-30 LAB — COMPREHENSIVE METABOLIC PANEL
ALBUMIN: 3.8 g/dL (ref 3.5–5.0)
ALT: 17 U/L (ref 0–55)
ANION GAP: 10 meq/L (ref 3–11)
AST: 18 U/L (ref 5–34)
Alkaline Phosphatase: 87 U/L (ref 40–150)
BILIRUBIN TOTAL: 0.24 mg/dL (ref 0.20–1.20)
BUN: 8.8 mg/dL (ref 7.0–26.0)
CALCIUM: 10.3 mg/dL (ref 8.4–10.4)
CO2: 28 mEq/L (ref 22–29)
CREATININE: 0.8 mg/dL (ref 0.6–1.1)
Chloride: 101 mEq/L (ref 98–109)
Glucose: 92 mg/dl (ref 70–140)
POTASSIUM: 4.1 meq/L (ref 3.5–5.1)
Sodium: 139 mEq/L (ref 136–145)
Total Protein: 7.9 g/dL (ref 6.4–8.3)

## 2017-01-30 MED ORDER — IOPAMIDOL (ISOVUE-300) INJECTION 61%
100.0000 mL | Freq: Once | INTRAVENOUS | Status: AC | PRN
  Administered 2017-01-30: 100 mL via INTRAVENOUS

## 2017-01-30 MED ORDER — IOPAMIDOL (ISOVUE-300) INJECTION 61%
INTRAVENOUS | Status: AC
  Filled 2017-01-30: qty 100

## 2017-01-31 ENCOUNTER — Ambulatory Visit (HOSPITAL_BASED_OUTPATIENT_CLINIC_OR_DEPARTMENT_OTHER): Payer: Self-pay | Admitting: Hematology and Oncology

## 2017-01-31 ENCOUNTER — Ambulatory Visit (HOSPITAL_BASED_OUTPATIENT_CLINIC_OR_DEPARTMENT_OTHER): Payer: Self-pay

## 2017-01-31 ENCOUNTER — Encounter: Payer: Self-pay | Admitting: Hematology and Oncology

## 2017-01-31 DIAGNOSIS — I7090 Unspecified atherosclerosis: Secondary | ICD-10-CM

## 2017-01-31 DIAGNOSIS — Z5111 Encounter for antineoplastic chemotherapy: Secondary | ICD-10-CM

## 2017-01-31 DIAGNOSIS — D701 Agranulocytosis secondary to cancer chemotherapy: Secondary | ICD-10-CM

## 2017-01-31 DIAGNOSIS — T451X5A Adverse effect of antineoplastic and immunosuppressive drugs, initial encounter: Secondary | ICD-10-CM

## 2017-01-31 DIAGNOSIS — C541 Malignant neoplasm of endometrium: Secondary | ICD-10-CM

## 2017-01-31 LAB — CBC WITH DIFFERENTIAL/PLATELET
BASO%: 0 % (ref 0.0–2.0)
Basophils Absolute: 0 10*3/uL (ref 0.0–0.1)
EOS%: 0 % (ref 0.0–7.0)
Eosinophils Absolute: 0 10*3/uL (ref 0.0–0.5)
HCT: 37.8 % (ref 34.8–46.6)
HGB: 12.5 g/dL (ref 11.6–15.9)
LYMPH%: 22 % (ref 14.0–49.7)
MCH: 32.2 pg (ref 25.1–34.0)
MCHC: 33.1 g/dL (ref 31.5–36.0)
MCV: 97.4 fL (ref 79.5–101.0)
MONO#: 0 10*3/uL — AB (ref 0.1–0.9)
MONO%: 0 % (ref 0.0–14.0)
NEUT%: 78 % — AB (ref 38.4–76.8)
NEUTROS ABS: 2 10*3/uL (ref 1.5–6.5)
PLATELETS: 187 10*3/uL (ref 145–400)
RBC: 3.88 10*6/uL (ref 3.70–5.45)
RDW: 16.3 % — ABNORMAL HIGH (ref 11.2–14.5)
WBC: 2.5 10*3/uL — AB (ref 3.9–10.3)
lymph#: 0.6 10*3/uL — ABNORMAL LOW (ref 0.9–3.3)

## 2017-01-31 MED ORDER — SODIUM CHLORIDE 0.9 % IV SOLN
Freq: Once | INTRAVENOUS | Status: AC
  Administered 2017-01-31: 11:00:00 via INTRAVENOUS
  Filled 2017-01-31: qty 5

## 2017-01-31 MED ORDER — SODIUM CHLORIDE 0.9 % IV SOLN
Freq: Once | INTRAVENOUS | Status: AC
  Administered 2017-01-31: 11:00:00 via INTRAVENOUS

## 2017-01-31 MED ORDER — FAMOTIDINE IN NACL 20-0.9 MG/50ML-% IV SOLN
20.0000 mg | Freq: Once | INTRAVENOUS | Status: AC
  Administered 2017-01-31: 20 mg via INTRAVENOUS

## 2017-01-31 MED ORDER — DIPHENHYDRAMINE HCL 50 MG/ML IJ SOLN
50.0000 mg | Freq: Once | INTRAMUSCULAR | Status: AC
  Administered 2017-01-31: 50 mg via INTRAVENOUS
  Filled 2017-01-31: qty 1

## 2017-01-31 MED ORDER — FAMOTIDINE IN NACL 20-0.9 MG/50ML-% IV SOLN
INTRAVENOUS | Status: AC
  Filled 2017-01-31: qty 50

## 2017-01-31 MED ORDER — DIPHENHYDRAMINE HCL 50 MG/ML IJ SOLN: 50.0000 mg | Freq: Once | INTRAMUSCULAR | Status: DC

## 2017-01-31 MED ORDER — PALONOSETRON HCL INJECTION 0.25 MG/5ML
0.2500 mg | Freq: Once | INTRAVENOUS | Status: AC
  Administered 2017-01-31: 0.25 mg via INTRAVENOUS

## 2017-01-31 MED ORDER — PACLITAXEL CHEMO INJECTION 300 MG/50ML
140.0000 mg/m2 | Freq: Once | INTRAVENOUS | Status: AC
  Administered 2017-01-31: 204 mg via INTRAVENOUS
  Filled 2017-01-31: qty 34

## 2017-01-31 MED ORDER — SODIUM CHLORIDE 0.9 % IV SOLN
500.0000 mg | Freq: Once | INTRAVENOUS | Status: AC
  Administered 2017-01-31: 500 mg via INTRAVENOUS
  Filled 2017-01-31: qty 50

## 2017-01-31 MED ORDER — PALONOSETRON HCL INJECTION 0.25 MG/5ML
INTRAVENOUS | Status: AC
  Filled 2017-01-31: qty 5

## 2017-01-31 NOTE — Assessment & Plan Note (Signed)
She tolerated treatment very well except for leukopenia and mild nausea Clinically, she is gaining weight and her bowel habits are regular CT scan from 01/30/2017 were reviewed with the patient and her husband which show near complete response to treatment Her recent tumor markers had normalized I will proceed with treatment with minor dose adjustment due to persistent leukopenia  I will see her back in 3 weeks for cycle 5 for treatment

## 2017-01-31 NOTE — Patient Instructions (Signed)
[facility] Discharge Instructions for Patients Receiving Chemotherapy  Today you received the following chemotherapy agents Taxol/Carboplatin To help prevent nausea and vomiting after your treatment, we encourage you to take your nausea medication as prescribed.   If you develop nausea and vomiting that is not controlled by your nausea medication, call the clinic.   BELOW ARE SYMPTOMS THAT SHOULD BE REPORTED IMMEDIATELY:  *FEVER GREATER THAN 100.5 F  *CHILLS WITH OR WITHOUT FEVER  NAUSEA AND VOMITING THAT IS NOT CONTROLLED WITH YOUR NAUSEA MEDICATION  *UNUSUAL SHORTNESS OF BREATH  *UNUSUAL BRUISING OR BLEEDING  TENDERNESS IN MOUTH AND THROAT WITH OR WITHOUT PRESENCE OF ULCERS  *URINARY PROBLEMS  *BOWEL PROBLEMS  UNUSUAL RASH Items with * indicate a potential emergency and should be followed up as soon as possible.  Feel free to call the clinic you have any questions or concerns. The clinic phone number is [phone].  Please show the CHEMO ALERT CARD at check-in to the Emergency Department and triage nurse.   

## 2017-01-31 NOTE — Progress Notes (Signed)
[facility] OFFICE PROGRESS NOTE  Patient Care Team: Patient, No Pcp Per as PCP - General (General Practice)  SUMMARY OF ONCOLOGIC HISTORY: Oncology History   MSI stable, Her 2 neg, ER patchy positivity     Endometrial cancer ([place])   04/05/2015 Imaging    CT abdomen:  1. Large bilateral hydrosalpinx/pyosalpinx. 2. Enlarged fibroid uterus. 3. Mild bilateral hydronephrosis from #1.       04/06/2015 Procedure    Status post CT-guided drainage of right and left tubo-ovarian abscess, with parallel drains from a right trans gluteal approach. Sample was sent from both the left and the right fluid collections to the lab.      04/19/2015 Imaging    CT abdomen and pelvis: The bilateral pelvic fluid collections have resolved following placement of the percutaneous drainage catheters. Small amount of fluid in the pelvis but no evidence for residual abscess collections or hydrosalpinx. Fibroid uterus. Innumerable uterine fibroids. Many of these fibroids are pedunculated. Development of a small left pleural effusion. Mild fat stranding in the abdominal and pelvic mesentery suggestive for mild edema      05/03/2015 Imaging    US abdomen and pelvis 1. Significantly regressed but not completely resolved bilateral hydrosalpinx/pyosalpinx. Residual is greater on the right (18 mm diameter) with mild fluid complexity. 2. Stable fibroid uterus. 3. No pelvic free fluid      07/06/2015 Pathology Results    PAP smear NEGATIVE FOR INTRAEPITHELIAL LESIONS OR MALIGNANCY.      07/18/2015 Pathology Results    Vulva, biopsy, mass - BENIGN EPIDERMAL CYST. - NO EVIDENCE OF MALIGNANCY      10/21/2016 Initial Diagnosis    She presented to the ER for severe abdominal pain      10/22/2016 Imaging    CT abdomen and pelvis 1. 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 2. Complex partially cystic mass at the left  adnexa, measuring 5.9 x 5.5 x 4.3 cm. This raises concern for primary ovarian malignancy. PET/CT could be considered for further evaluation. 3. Numerous uterine fibroids again noted, some which have increased in size. Underlying endometrial malignancy cannot be excluded. 4. Dilated bilateral fluid collections have reaccumulated within the pelvis, the largest of which measures 15 cm in length. 5. Enlarged bilateral pelvic sidewall nodes measure up to 1.6 cm in short axis, compatible with metastatic disease. Mildly prominent retroperitoneal nodes seen. Mildly prominent nodes about the left inguinal vessels. 6. 1.6 cm cyst at the medial right hepatic lobe.       10/29/2016 - 11/02/2016 Hospital Admission    She was admitted to [facility] for management of newly diagnosed endometrial cancer      10/29/2016 Tumor Marker    Patient's tumor was tested for the following markers: CA125 Results of the tumor marker test revealed 740      10/30/2016 Surgery    Procedure(s): - Open ureteral exploration - Cystourethroscopy - Left ureteral stent placement  Operative Findings:   1.) Normal cystourethrosocpy without lesions, masses or stones. Bilateral ureteral orifices identified with efflux of clear yellow urine 2.) Successful placement of left ureteral stent under visualization with appropriate curl visualized within the bladder and stent palpable within the ureter to the level of the renal pelvis 3) Open exploration determined ureter to be intact and viable.      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) - Carcinoma involves outer half of myometrium, serosal surface, and cervix - Bilateral ovaries / adnexa are positive for high grade serous carcinoma - Extensive lymphovascular space invasion is present - See  synoptic report and comment  B: "Free floating abdominal mass", removal  - Benign cyst with mesothelial-type lining, consistent with peritoneal inclusion cyst, size 7.0 cm - No malignancy identified   C: Posterior cul-de-sac mass, excision  - Involved by high grade serous carcinoma  D: Lymph nodes, left pelvic, resection  - Five of six lymph nodes involved by metastatic high grade serous carcinoma, size of metastases up to 1.5 cm, with no definite extracapsular extension identified (5/6)  E: Lymph nodes, right pelvic, excision  - Four of four lymph nodes involved by metastatic high grade serous carcinoma, size of metastases up to 1.6 cm, with no definite extracapsular extension identified (4/4)  F: Lymph nodes, right para-aortic, resection  - Three of four lymph nodes involved by metastatic high grade serous carcinoma, size of metastases up to 1.7 cm, with no definite extracapsular extension identified (3/4)  G: Omentum, omentectomy  - Involved by metastatic high grade serous carcinoma, size of nodules up to 2.9 cm   Given the presence of a large endometrial mass with serous endometrial intraepithelial carcinoma as well as deep myometrial invasion and cervical and serosal involvement, the tumor is staged as an endometrial primary. However, ovarian primary with spread to the uterus or synchronous primary tumors are also a diagnostic possibility, and definitive site of origin cannot be determined histologically. Extensive lymphovascular space invasion is present, and the tumor involves both ovaries, peritoneum, omentum, and multiple lymph nodes. No distinct fallopian tubes are identified grossly or microscopically; however, they may have been incorporated into the ovarian / adnexal tumor masses.  Immunohistochemical stains are performed on block A11 and demonstrate the tumor is positive for p53 and WT1 and is strongly diffusely positive for p16. It shows patchy focal staining for ER. WT1  staining is also positive in block A15, the right ovary. 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 Interval hysterectomy and likely oophorectomy, and possibly partial omentectomy. There is infiltration and nodularity of the upper omentum compatible with tumor along with complex density along the paracolic gutters, space of Retzius, and presacral space which may also represent tumor infiltration. 2. Soft tissue density favoring tumor along the right posterior piriformis muscle extending towards the vaginal cuff and cul-de-sac region. This likely represents tumor. 3. Stable or minimally increased retroperitoneal and pelvic adenopathy. This is thought to be malignant. 4. Reduced ascites. 5. Mild bilateral hydronephrosis and proximal hydroureter extending down to the iliac vessel cross over is were there is stranding and some mild adenopathy. This likely represents low-grade partial ureteral obstruction and is most likely from extrinsic mass effect. 6. Aortic Atherosclerosis (ICD10-I70.0) and Emphysema (ICD10-J43.9). 7. Presacral and perirectal tumor nodularity.      11/20/2016 Tumor Marker    Patient's tumor was tested for the following markers: CA-125. Results of the tumor marker test revealed 154.2.      11/29/2016 Genetic Testing    Patient has genetic testing done for MSI instability Results revealed patient has no MSI instability      11/30/2016 -  Chemotherapy    She received carboplatin & Taxol  01/04/2017 Genetic Testing    No mutations were identified in 83 gene analyzed for hereditary cancer risk. VUSs were noted in ATM and GPC3.       01/10/2017 Tumor Marker    Patient's tumor was tested for the following markers: CA125 Results of the tumor marker test revealed 23.5      01/30/2017 Imaging    1. Moderate to marked response to therapy. 2. Resolution of abdominopelvic  adenopathy. 3. Resolution of omental nodularity. Significantly improved peritoneal disease within the pelvis. 4. Improvement in trace right-sided caliectasis and proximal right hydroureter. No well-defined obstructive mass. 5. Age advanced aortic atherosclerosis. 6. Mild bladder wall thickening could be due to under distention and/or cystitis.       INTERVAL HISTORY: Please see below for problem oriented charting. She returns for further follow-up She felt well She have started to gain some weight She denies abdominal pain, nausea or vomiting No changes in bowel habits Denies peripheral neuropathy.  REVIEW OF SYSTEMS:   Constitutional: Denies fevers, chills or abnormal weight loss Eyes: Denies blurriness of vision Ears, nose, mouth, throat, and face: Denies mucositis or sore throat Respiratory: Denies cough, dyspnea or wheezes Cardiovascular: Denies palpitation, chest discomfort or lower extremity swelling Gastrointestinal:  Denies nausea, heartburn or change in bowel habits Skin: Denies abnormal skin rashes Lymphatics: Denies new lymphadenopathy or easy bruising Neurological:Denies numbness, tingling or new weaknesses Behavioral/Psych: Mood is stable, no new changes  All other systems were reviewed with the patient and are negative.  I have reviewed the past medical history, past surgical history, social history and family history with the patient and they are unchanged from previous note.  ALLERGIES:  has No Known Allergies.  MEDICATIONS:  Current Outpatient Prescriptions  Medication Sig Dispense Refill  . acetaminophen (TYLENOL) 325 MG tablet Take 650 mg by mouth every 6 (six) hours as needed.    . ALPRAZolam (XANAX) 0.5 MG tablet Take 0.5 mg by mouth 3 (three) times daily as needed for anxiety.    Marland Kitchen dexamethasone (DECADRON) 4 MG tablet Take 5 tablets at midnight and 5 tablets at 6 am the morning of treatment, by mouth, every 21 days 30 tablet 1  . ondansetron (ZOFRAN) 8  MG tablet Take 1 tablet (8 mg total) by mouth 2 (two) times daily as needed for refractory nausea / vomiting. Start on day 3 after chemo. 30 tablet 1  . oxyCODONE (OXY IR/ROXICODONE) 5 MG immediate release tablet Take 1 tablet (5 mg total) by mouth every 4 (four) hours as needed for severe pain. 60 tablet 0  . prochlorperazine (COMPAZINE) 10 MG tablet Take 1 tablet (10 mg total) by mouth every 6 (six) hours as needed (Nausea or vomiting). 30 tablet 1  . simethicone (MYLICON) 80 MG chewable tablet Chew 80 mg by mouth 4 (four) times daily as needed.     No current facility-administered medications for this visit.     PHYSICAL EXAMINATION: ECOG PERFORMANCE STATUS: 0 - Asymptomatic  Vitals:   01/31/17 0845  BP: 114/70  Pulse: 87  Resp: 20  Temp: 98.2 F (36.8 C)  SpO2: 99%   Filed Weights   01/31/17 0845  Weight: 115 lb 14.4 oz (52.6 kg)    GENERAL:alert, no distress and comfortable SKIN: skin color, texture, turgor are normal, no rashes or significant lesions EYES: normal, Conjunctiva are pink and non-injected, sclera clear OROPHARYNX:no exudate, no erythema and lips, buccal mucosa, and tongue normal  NECK: supple, thyroid normal size, non-tender, without nodularity LYMPH:  no palpable lymphadenopathy in the cervical, axillary or inguinal LUNGS: clear to auscultation and percussion with normal breathing effort HEART: regular rate & rhythm and no murmurs and no lower extremity edema ABDOMEN:abdomen soft, non-tender and normal bowel sounds Musculoskeletal:no cyanosis of digits and no clubbing  NEURO: alert & oriented x 3 with fluent speech, no focal motor/sensory deficits  LABORATORY DATA:  I have reviewed the data as listed    Component Value Date/Time   NA 139 01/30/2017 0909   K 4.1 01/30/2017 0909   CL 101 10/22/2016 2224   CO2 28 01/30/2017 0909   GLUCOSE 92 01/30/2017 0909   BUN 8.8 01/30/2017 0909   CREATININE 0.8 01/30/2017 0909   CALCIUM 10.3 01/30/2017 0909    PROT 7.9 01/30/2017 0909   ALBUMIN 3.8 01/30/2017 0909   AST 18 01/30/2017 0909   ALT 17 01/30/2017 0909   ALKPHOS 87 01/30/2017 0909   BILITOT 0.24 01/30/2017 0909   GFRNONAA >60 10/22/2016 2224   GFRAA >60 10/22/2016 2224    No results found for: SPEP, UPEP  Lab Results  Component Value Date   WBC 2.5 (L) 01/31/2017   NEUTROABS 2.0 01/31/2017   HGB 12.5 01/31/2017   HCT 37.8 01/31/2017   MCV 97.4 01/31/2017   PLT 187 01/31/2017      Chemistry      Component Value Date/Time   NA 139 01/30/2017 0909   K 4.1 01/30/2017 0909   CL 101 10/22/2016 2224   CO2 28 01/30/2017 0909   BUN 8.8 01/30/2017 0909   CREATININE 0.8 01/30/2017 0909      Component Value Date/Time   CALCIUM 10.3 01/30/2017 0909   ALKPHOS 87 01/30/2017 0909   AST 18 01/30/2017 0909   ALT 17 01/30/2017 0909   BILITOT 0.24 01/30/2017 0909       RADIOGRAPHIC STUDIES: I have reviewed multiple imaging study with the patient and her husband I have personally reviewed the radiological images as listed and agreed with the findings in the report. Ct Abdomen Pelvis W Contrast  Result Date: 01/30/2017 CLINICAL DATA:  Endometrial cancer diagnosed 6/18. Ongoing chemotherapy. Hysterectomy. Oophorectomy. Pelvic inflammatory disease with drains. EXAM: CT ABDOMEN AND PELVIS WITH CONTRAST TECHNIQUE: Multidetector CT imaging of the abdomen and pelvis was performed using the standard protocol following bolus administration of intravenous contrast. CONTRAST:  180m ISOVUE-300 IOPAMIDOL (ISOVUE-300) INJECTION 61% COMPARISON:  11/20/2016 FINDINGS: Lower chest: Clear lung bases. Normal heart size without pericardial or pleural effusion. Hepatobiliary: Left hepatic cysts. Focal steatosis adjacent the falciform ligament. Normal gallbladder, without biliary ductal dilatation. Pancreas: Parenchymal calcification within the pancreatic head is similar on image 30/series 2. This is likely immediately adjacent to the pancreatic duct. No duct  dilatation or evidence of acute pancreatitis. Spleen: Normal in size, without focal abnormality. Adrenals/Urinary Tract: Normal adrenal glands. Normal left kidney. Improvement in minimal right-sided caliectasis and hydroureter. Apparent mild bladder wall thickening could be due to incomplete distention. Stomach/Bowel: Normal stomach, without wall thickening. Normal colon, appendix, and terminal ileum. Normal small bowel. Vascular/Lymphatic: Age advanced aortic and branch vessel atherosclerosis. Significant decrease in retroperitoneal nodal size. Example left periaortic node measures 7 mm on image 39/series 2 versus 12 mm on the prior exam (when remeasured). Index left external iliac node measures 0.7 cm on image 63/series 2 versus 1.8 cm on the prior. Reproductive: Hysterectomy.  Status post bilateral oophorectomy. Other: Resolution of small volume abdominal ascites. The previously described omental nodularity has resolved. Significant improvement in peritoneal disease within the pelvis.  Residual right pelvic implant measures 1.7 x 1.6 cm on image 62/series 2. This is at the site of infiltrative 4.5 x 2.7 cm soft tissue density mass on the prior exam (when remeasured). Musculoskeletal: Similar small sclerotic lesions within the pelvis, likely bone islands. IMPRESSION: 1. Moderate to marked response to therapy. 2. Resolution of abdominopelvic adenopathy. 3. Resolution of omental nodularity. Significantly improved peritoneal disease within the pelvis. 4. Improvement in trace right-sided caliectasis and proximal right hydroureter. No well-defined obstructive mass. 5. Age advanced aortic atherosclerosis. 6. Mild bladder wall thickening could be due to underdistention and/or cystitis. Electronically Signed   By: Abigail Miyamoto M.D.   On: 01/30/2017 13:04    ASSESSMENT & PLAN:  Endometrial cancer ([place]) She tolerated treatment very well except for leukopenia and mild nausea Clinically, she is gaining weight and her  bowel habits are regular CT scan from 01/30/2017 were reviewed with the patient and her husband which show near complete response to treatment Her recent tumor markers had normalized I will proceed with treatment with minor dose adjustment due to persistent leukopenia  I will see her back in 3 weeks for cycle 5 for treatment  Leukopenia due to antineoplastic chemotherapy ([place]) I will proceed with treatment with minor dose adjustment.  Arteriosclerosis Imaging study come from advanced arteriosclerosis She has not smoked since the last time we met I recommend 81 mg aspirin therapy.   No orders of the defined types were placed in this encounter.  All questions were answered. The patient knows to call the clinic with any problems, questions or concerns. No barriers to learning was detected. I spent 20 minutes counseling the patient face to face. The total time spent in the appointment was 25 minutes and more than 50% was on counseling and review of test results     Heath Lark, MD 01/31/2017 10:25 AM

## 2017-01-31 NOTE — Assessment & Plan Note (Signed)
Imaging study come from advanced arteriosclerosis She has not smoked since the last time we met I recommend 81 mg aspirin therapy.

## 2017-01-31 NOTE — Assessment & Plan Note (Signed)
I will proceed with treatment with minor dose adjustment.

## 2017-01-31 NOTE — Progress Notes (Signed)
Per Dr. Alvy Bimler, it's OK to treat with today's labs. Infusion room nurse notified.

## 2017-02-21 ENCOUNTER — Ambulatory Visit (HOSPITAL_BASED_OUTPATIENT_CLINIC_OR_DEPARTMENT_OTHER): Payer: Self-pay | Admitting: Hematology and Oncology

## 2017-02-21 ENCOUNTER — Ambulatory Visit (HOSPITAL_BASED_OUTPATIENT_CLINIC_OR_DEPARTMENT_OTHER): Payer: Self-pay

## 2017-02-21 ENCOUNTER — Other Ambulatory Visit (HOSPITAL_BASED_OUTPATIENT_CLINIC_OR_DEPARTMENT_OTHER): Payer: Self-pay

## 2017-02-21 ENCOUNTER — Telehealth: Payer: Self-pay | Admitting: Hematology and Oncology

## 2017-02-21 DIAGNOSIS — D701 Agranulocytosis secondary to cancer chemotherapy: Secondary | ICD-10-CM

## 2017-02-21 DIAGNOSIS — C541 Malignant neoplasm of endometrium: Secondary | ICD-10-CM

## 2017-02-21 DIAGNOSIS — Z7189 Other specified counseling: Secondary | ICD-10-CM

## 2017-02-21 DIAGNOSIS — T50905A Adverse effect of unspecified drugs, medicaments and biological substances, initial encounter: Secondary | ICD-10-CM

## 2017-02-21 DIAGNOSIS — R739 Hyperglycemia, unspecified: Secondary | ICD-10-CM

## 2017-02-21 DIAGNOSIS — T451X5A Adverse effect of antineoplastic and immunosuppressive drugs, initial encounter: Secondary | ICD-10-CM

## 2017-02-21 DIAGNOSIS — R110 Nausea: Secondary | ICD-10-CM

## 2017-02-21 DIAGNOSIS — Z5111 Encounter for antineoplastic chemotherapy: Secondary | ICD-10-CM

## 2017-02-21 LAB — COMPREHENSIVE METABOLIC PANEL
ALT: 13 U/L (ref 0–55)
ANION GAP: 12 meq/L — AB (ref 3–11)
AST: 14 U/L (ref 5–34)
Albumin: 3.8 g/dL (ref 3.5–5.0)
Alkaline Phosphatase: 82 U/L (ref 40–150)
BUN: 18 mg/dL (ref 7.0–26.0)
CO2: 24 meq/L (ref 22–29)
Calcium: 10.1 mg/dL (ref 8.4–10.4)
Chloride: 103 mEq/L (ref 98–109)
Creatinine: 0.8 mg/dL (ref 0.6–1.1)
EGFR: 86 mL/min/{1.73_m2} — ABNORMAL LOW (ref >90)
Glucose: 235 mg/dl — ABNORMAL HIGH (ref 70–140)
POTASSIUM: 4.3 meq/L (ref 3.5–5.1)
SODIUM: 139 meq/L (ref 136–145)
Total Bilirubin: 0.26 mg/dL (ref 0.20–1.20)
Total Protein: 7.8 g/dL (ref 6.4–8.3)

## 2017-02-21 LAB — CBC WITH DIFFERENTIAL/PLATELET
BASO%: 0.4 % (ref 0.0–2.0)
BASOS ABS: 0 10*3/uL (ref 0.0–0.1)
EOS ABS: 0 10*3/uL (ref 0.0–0.5)
EOS%: 0 % (ref 0.0–7.0)
HCT: 40.6 % (ref 34.8–46.6)
HEMOGLOBIN: 13.7 g/dL (ref 11.6–15.9)
LYMPH%: 29 % (ref 14.0–49.7)
MCH: 33.3 pg (ref 25.1–34.0)
MCHC: 33.8 g/dL (ref 31.5–36.0)
MCV: 98.4 fL (ref 79.5–101.0)
MONO#: 0 10*3/uL — AB (ref 0.1–0.9)
MONO%: 0.6 % (ref 0.0–14.0)
NEUT%: 70 % (ref 38.4–76.8)
NEUTROS ABS: 1.6 10*3/uL (ref 1.5–6.5)
PLATELETS: 173 10*3/uL (ref 145–400)
RBC: 4.13 10*6/uL (ref 3.70–5.45)
RDW: 17.4 % — AB (ref 11.2–14.5)
WBC: 2.3 10*3/uL — AB (ref 3.9–10.3)
lymph#: 0.7 10*3/uL — ABNORMAL LOW (ref 0.9–3.3)

## 2017-02-21 MED ORDER — SODIUM CHLORIDE 0.9 % IV SOLN
Freq: Once | INTRAVENOUS | Status: AC
  Administered 2017-02-21: 12:00:00 via INTRAVENOUS
  Filled 2017-02-21: qty 5

## 2017-02-21 MED ORDER — SODIUM CHLORIDE 0.9 % IV SOLN
140.0000 mg/m2 | Freq: Once | INTRAVENOUS | Status: AC
  Administered 2017-02-21: 204 mg via INTRAVENOUS
  Filled 2017-02-21: qty 34

## 2017-02-21 MED ORDER — SODIUM CHLORIDE 0.9 % IV SOLN
50.0000 mg | Freq: Once | INTRAVENOUS | Status: AC
  Administered 2017-02-21: 50 mg via INTRAVENOUS
  Filled 2017-02-21: qty 1

## 2017-02-21 MED ORDER — FAMOTIDINE IN NACL 20-0.9 MG/50ML-% IV SOLN
20.0000 mg | Freq: Once | INTRAVENOUS | Status: AC
  Administered 2017-02-21: 20 mg via INTRAVENOUS

## 2017-02-21 MED ORDER — FAMOTIDINE IN NACL 20-0.9 MG/50ML-% IV SOLN
INTRAVENOUS | Status: AC
  Filled 2017-02-21: qty 50

## 2017-02-21 MED ORDER — SODIUM CHLORIDE 0.9 % IV SOLN
Freq: Once | INTRAVENOUS | Status: AC
  Administered 2017-02-21: 11:00:00 via INTRAVENOUS

## 2017-02-21 MED ORDER — PALONOSETRON HCL INJECTION 0.25 MG/5ML
0.2500 mg | Freq: Once | INTRAVENOUS | Status: AC
  Administered 2017-02-21: 0.25 mg via INTRAVENOUS

## 2017-02-21 MED ORDER — PALONOSETRON HCL INJECTION 0.25 MG/5ML
INTRAVENOUS | Status: AC
  Filled 2017-02-21: qty 5

## 2017-02-21 MED ORDER — SODIUM CHLORIDE 0.9 % IV SOLN
500.0000 mg | Freq: Once | INTRAVENOUS | Status: AC
  Administered 2017-02-21: 500 mg via INTRAVENOUS
  Filled 2017-02-21: qty 50

## 2017-02-21 NOTE — Telephone Encounter (Signed)
Scheduled appt per 9/27 los - Gave patient AVS and calender per los.  

## 2017-02-21 NOTE — Patient Instructions (Signed)
[facility] Discharge Instructions for Patients Receiving Chemotherapy  Today you received the following chemotherapy agents taxol/carboplatin  To help prevent nausea and vomiting after your treatment, we encourage you to take your nausea medication as directed   If you develop nausea and vomiting that is not controlled by your nausea medication, call the clinic.   BELOW ARE SYMPTOMS THAT SHOULD BE REPORTED IMMEDIATELY:  *FEVER GREATER THAN 100.5 F  *CHILLS WITH OR WITHOUT FEVER  NAUSEA AND VOMITING THAT IS NOT CONTROLLED WITH YOUR NAUSEA MEDICATION  *UNUSUAL SHORTNESS OF BREATH  *UNUSUAL BRUISING OR BLEEDING  TENDERNESS IN MOUTH AND THROAT WITH OR WITHOUT PRESENCE OF ULCERS  *URINARY PROBLEMS  *BOWEL PROBLEMS  UNUSUAL RASH Items with * indicate a potential emergency and should be followed up as soon as possible.  Feel free to call the clinic you have any questions or concerns. The clinic phone number is [phone].  

## 2017-02-22 ENCOUNTER — Encounter: Payer: Self-pay | Admitting: Hematology and Oncology

## 2017-02-22 DIAGNOSIS — T50905A Adverse effect of unspecified drugs, medicaments and biological substances, initial encounter: Secondary | ICD-10-CM

## 2017-02-22 DIAGNOSIS — R739 Hyperglycemia, unspecified: Secondary | ICD-10-CM | POA: Insufficient documentation

## 2017-02-22 DIAGNOSIS — Z7189 Other specified counseling: Secondary | ICD-10-CM | POA: Insufficient documentation

## 2017-02-22 NOTE — Assessment & Plan Note (Signed)
She tolerated treatment very well except for leukopenia and mild nausea Clinically, she is gaining weight and her bowel habits are regular CT scan from 01/30/2017 were reviewed with the patient and her husband which show near complete response to treatment Her recent tumor markers had normalized I will proceed with treatment with minor dose adjustment due to persistent leukopenia  I will see her back in 3 weeks for cycle 6 for treatment

## 2017-02-22 NOTE — Assessment & Plan Note (Signed)
She has drug induced hyperglycemia She is not symptomatic Observe only

## 2017-02-22 NOTE — Progress Notes (Signed)
[facility] OFFICE PROGRESS NOTE  Patient Care Team: Patient, No Pcp Per as PCP - General (General Practice)  SUMMARY OF ONCOLOGIC HISTORY: Oncology History   MSI stable, Her 2 neg, ER patchy positivity     Endometrial cancer ([place])   04/05/2015 Imaging    CT abdomen:  1. Large bilateral hydrosalpinx/pyosalpinx. 2. Enlarged fibroid uterus. 3. Mild bilateral hydronephrosis from #1.       04/06/2015 Procedure    Status post CT-guided drainage of right and left tubo-ovarian abscess, with parallel drains from a right trans gluteal approach. Sample was sent from both the left and the right fluid collections to the lab.      04/19/2015 Imaging    CT abdomen and pelvis: The bilateral pelvic fluid collections have resolved following placement of the percutaneous drainage catheters. Small amount of fluid in the pelvis but no evidence for residual abscess collections or hydrosalpinx. Fibroid uterus. Innumerable uterine fibroids. Many of these fibroids are pedunculated. Development of a small left pleural effusion. Mild fat stranding in the abdominal and pelvic mesentery suggestive for mild edema      05/03/2015 Imaging    US abdomen and pelvis 1. Significantly regressed but not completely resolved bilateral hydrosalpinx/pyosalpinx. Residual is greater on the right (18 mm diameter) with mild fluid complexity. 2. Stable fibroid uterus. 3. No pelvic free fluid      07/06/2015 Pathology Results    PAP smear NEGATIVE FOR INTRAEPITHELIAL LESIONS OR MALIGNANCY.      07/18/2015 Pathology Results    Vulva, biopsy, mass - BENIGN EPIDERMAL CYST. - NO EVIDENCE OF MALIGNANCY      10/21/2016 Initial Diagnosis    She presented to the ER for severe abdominal pain      10/22/2016 Imaging    CT abdomen and pelvis 1. 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 2. Complex partially cystic mass at the left  adnexa, measuring 5.9 x 5.5 x 4.3 cm. This raises concern for primary ovarian malignancy. PET/CT could be considered for further evaluation. 3. Numerous uterine fibroids again noted, some which have increased in size. Underlying endometrial malignancy cannot be excluded. 4. Dilated bilateral fluid collections have reaccumulated within the pelvis, the largest of which measures 15 cm in length. 5. Enlarged bilateral pelvic sidewall nodes measure up to 1.6 cm in short axis, compatible with metastatic disease. Mildly prominent retroperitoneal nodes seen. Mildly prominent nodes about the left inguinal vessels. 6. 1.6 cm cyst at the medial right hepatic lobe.       10/29/2016 - 11/02/2016 Hospital Admission    She was admitted to [facility] for management of newly diagnosed endometrial cancer      10/29/2016 Tumor Marker    Patient's tumor was tested for the following markers: CA125 Results of the tumor marker test revealed 740      10/30/2016 Surgery    Procedure(s): - Open ureteral exploration - Cystourethroscopy - Left ureteral stent placement  Operative Findings:   1.) Normal cystourethrosocpy without lesions, masses or stones. Bilateral ureteral orifices identified with efflux of clear yellow urine 2.) Successful placement of left ureteral stent under visualization with appropriate curl visualized within the bladder and stent palpable within the ureter to the level of the renal pelvis 3) Open exploration determined ureter to be intact and viable.      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) - Carcinoma involves outer half of myometrium, serosal surface, and cervix - Bilateral ovaries / adnexa are positive for high grade serous carcinoma - Extensive lymphovascular space invasion is present - See  synoptic report and comment  B: "Free floating abdominal mass", removal  - Benign cyst with mesothelial-type lining, consistent with peritoneal inclusion cyst, size 7.0 cm - No malignancy identified   C: Posterior cul-de-sac mass, excision  - Involved by high grade serous carcinoma  D: Lymph nodes, left pelvic, resection  - Five of six lymph nodes involved by metastatic high grade serous carcinoma, size of metastases up to 1.5 cm, with no definite extracapsular extension identified (5/6)  E: Lymph nodes, right pelvic, excision  - Four of four lymph nodes involved by metastatic high grade serous carcinoma, size of metastases up to 1.6 cm, with no definite extracapsular extension identified (4/4)  F: Lymph nodes, right para-aortic, resection  - Three of four lymph nodes involved by metastatic high grade serous carcinoma, size of metastases up to 1.7 cm, with no definite extracapsular extension identified (3/4)  G: Omentum, omentectomy  - Involved by metastatic high grade serous carcinoma, size of nodules up to 2.9 cm   Given the presence of a large endometrial mass with serous endometrial intraepithelial carcinoma as well as deep myometrial invasion and cervical and serosal involvement, the tumor is staged as an endometrial primary. However, ovarian primary with spread to the uterus or synchronous primary tumors are also a diagnostic possibility, and definitive site of origin cannot be determined histologically. Extensive lymphovascular space invasion is present, and the tumor involves both ovaries, peritoneum, omentum, and multiple lymph nodes. No distinct fallopian tubes are identified grossly or microscopically; however, they may have been incorporated into the ovarian / adnexal tumor masses.  Immunohistochemical stains are performed on block A11 and demonstrate the tumor is positive for p53 and WT1 and is strongly diffusely positive for p16. It shows patchy focal staining for ER. WT1  staining is also positive in block A15, the right ovary. 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 Interval hysterectomy and likely oophorectomy, and possibly partial omentectomy. There is infiltration and nodularity of the upper omentum compatible with tumor along with complex density along the paracolic gutters, space of Retzius, and presacral space which may also represent tumor infiltration. 2. Soft tissue density favoring tumor along the right posterior piriformis muscle extending towards the vaginal cuff and cul-de-sac region. This likely represents tumor. 3. Stable or minimally increased retroperitoneal and pelvic adenopathy. This is thought to be malignant. 4. Reduced ascites. 5. Mild bilateral hydronephrosis and proximal hydroureter extending down to the iliac vessel cross over is were there is stranding and some mild adenopathy. This likely represents low-grade partial ureteral obstruction and is most likely from extrinsic mass effect. 6. Aortic Atherosclerosis (ICD10-I70.0) and Emphysema (ICD10-J43.9). 7. Presacral and perirectal tumor nodularity.      11/20/2016 Tumor Marker    Patient's tumor was tested for the following markers: CA-125. Results of the tumor marker test revealed 154.2.      11/29/2016 Genetic Testing    Patient has genetic testing done for MSI instability Results revealed patient has no MSI instability      11/30/2016 -  Chemotherapy    She received carboplatin & Taxol  01/04/2017 Genetic Testing    No mutations were identified in 83 gene analyzed for hereditary cancer risk. VUSs were noted in ATM and GPC3.       01/10/2017 Tumor Marker    Patient's tumor was tested for the following markers: CA125 Results of the tumor marker test revealed 23.5      01/30/2017 Imaging    1. Moderate to marked response to therapy. 2. Resolution of abdominopelvic  adenopathy. 3. Resolution of omental nodularity. Significantly improved peritoneal disease within the pelvis. 4. Improvement in trace right-sided caliectasis and proximal right hydroureter. No well-defined obstructive mass. 5. Age advanced aortic atherosclerosis. 6. Mild bladder wall thickening could be due to under distention and/or cystitis.       INTERVAL HISTORY: Please see below for problem oriented charting. She returns for cycle 5 of treatment Recently, she has significant nausea, vomiting and no oral intake for over 3 days Unfortunately, she did not call the cancer center for help Slowly, she started to improve She is able to eat well yesterday She denies peripheral neuropathy No recent fever or chills  REVIEW OF SYSTEMS:   Constitutional: Denies fevers, chills or abnormal weight loss Eyes: Denies blurriness of vision Ears, nose, mouth, throat, and face: Denies mucositis or sore throat Respiratory: Denies cough, dyspnea or wheezes Cardiovascular: Denies palpitation, chest discomfort or lower extremity swelling Skin: Denies abnormal skin rashes Lymphatics: Denies new lymphadenopathy or easy bruising Neurological:Denies numbness, tingling or new weaknesses Behavioral/Psych: Mood is stable, no new changes  All other systems were reviewed with the patient and are negative.  I have reviewed the past medical history, past surgical history, social history and family history with the patient and they are unchanged from previous note.  ALLERGIES:  has No Known Allergies.  MEDICATIONS:  Current Outpatient Prescriptions  Medication Sig Dispense Refill  . acetaminophen (TYLENOL) 325 MG tablet Take 650 mg by mouth every 6 (six) hours as needed.    . ALPRAZolam (XANAX) 0.5 MG tablet Take 0.5 mg by mouth 3 (three) times daily as needed for anxiety.    Marland Kitchen dexamethasone (DECADRON) 4 MG tablet Take 5 tablets at midnight and 5 tablets at 6 am the morning of treatment, by mouth, every 21  days 30 tablet 1  . ondansetron (ZOFRAN) 8 MG tablet Take 1 tablet (8 mg total) by mouth 2 (two) times daily as needed for refractory nausea / vomiting. Start on day 3 after chemo. 30 tablet 1  . oxyCODONE (OXY IR/ROXICODONE) 5 MG immediate release tablet Take 1 tablet (5 mg total) by mouth every 4 (four) hours as needed for severe pain. 60 tablet 0  . prochlorperazine (COMPAZINE) 10 MG tablet Take 1 tablet (10 mg total) by mouth every 6 (six) hours as needed (Nausea or vomiting). 30 tablet 1  . simethicone (MYLICON) 80 MG chewable tablet Chew 80 mg by mouth 4 (four) times daily as needed.     No current facility-administered medications for this visit.     PHYSICAL EXAMINATION: ECOG PERFORMANCE STATUS: 1 - Symptomatic but completely ambulatory  Vitals:   02/21/17 1012  BP: 107/62  Pulse: 95  Resp: 20  Temp: 97.7 F (36.5 C)  SpO2: 100%   Filed Weights   02/21/17 1012  Weight: 113 lb 11.2 oz (51.6 kg)    GENERAL:alert, no distress and comfortable SKIN: skin color, texture, turgor are normal, no rashes or significant lesions EYES: normal, Conjunctiva are pink and non-injected, sclera clear OROPHARYNX:no exudate, no erythema and lips, buccal  mucosa, and tongue normal  NECK: supple, thyroid normal size, non-tender, without nodularity LYMPH:  no palpable lymphadenopathy in the cervical, axillary or inguinal LUNGS: clear to auscultation and percussion with normal breathing effort HEART: regular rate & rhythm and no murmurs and no lower extremity edema ABDOMEN:abdomen soft, non-tender and normal bowel sounds Musculoskeletal:no cyanosis of digits and no clubbing  NEURO: alert & oriented x 3 with fluent speech, no focal motor/sensory deficits  LABORATORY DATA:  I have reviewed the data as listed    Component Value Date/Time   NA 139 02/21/2017 0951   K 4.3 02/21/2017 0951   CL 101 10/22/2016 2224   CO2 24 02/21/2017 0951   GLUCOSE 235 (H) 02/21/2017 0951   BUN 18.0 02/21/2017  0951   CREATININE 0.8 02/21/2017 0951   CALCIUM 10.1 02/21/2017 0951   PROT 7.8 02/21/2017 0951   ALBUMIN 3.8 02/21/2017 0951   AST 14 02/21/2017 0951   ALT 13 02/21/2017 0951   ALKPHOS 82 02/21/2017 0951   BILITOT 0.26 02/21/2017 0951   GFRNONAA >60 10/22/2016 2224   GFRAA >60 10/22/2016 2224    No results found for: SPEP, UPEP  Lab Results  Component Value Date   WBC 2.3 (L) 02/21/2017   NEUTROABS 1.6 02/21/2017   HGB 13.7 02/21/2017   HCT 40.6 02/21/2017   MCV 98.4 02/21/2017   PLT 173 02/21/2017      Chemistry      Component Value Date/Time   NA 139 02/21/2017 0951   K 4.3 02/21/2017 0951   CL 101 10/22/2016 2224   CO2 24 02/21/2017 0951   BUN 18.0 02/21/2017 0951   CREATININE 0.8 02/21/2017 0951      Component Value Date/Time   CALCIUM 10.1 02/21/2017 0951   ALKPHOS 82 02/21/2017 0951   AST 14 02/21/2017 0951   ALT 13 02/21/2017 0951   BILITOT 0.26 02/21/2017 0951       RADIOGRAPHIC STUDIES: I have personally reviewed the radiological images as listed and agreed with the findings in the report. Ct Abdomen Pelvis W Contrast  Result Date: 01/30/2017 CLINICAL DATA:  Endometrial cancer diagnosed 6/18. Ongoing chemotherapy. Hysterectomy. Oophorectomy. Pelvic inflammatory disease with drains. EXAM: CT ABDOMEN AND PELVIS WITH CONTRAST TECHNIQUE: Multidetector CT imaging of the abdomen and pelvis was performed using the standard protocol following bolus administration of intravenous contrast. CONTRAST:  117m ISOVUE-300 IOPAMIDOL (ISOVUE-300) INJECTION 61% COMPARISON:  11/20/2016 FINDINGS: Lower chest: Clear lung bases. Normal heart size without pericardial or pleural effusion. Hepatobiliary: Left hepatic cysts. Focal steatosis adjacent the falciform ligament. Normal gallbladder, without biliary ductal dilatation. Pancreas: Parenchymal calcification within the pancreatic head is similar on image 30/series 2. This is likely immediately adjacent to the pancreatic duct. No  duct dilatation or evidence of acute pancreatitis. Spleen: Normal in size, without focal abnormality. Adrenals/Urinary Tract: Normal adrenal glands. Normal left kidney. Improvement in minimal right-sided caliectasis and hydroureter. Apparent mild bladder wall thickening could be due to incomplete distention. Stomach/Bowel: Normal stomach, without wall thickening. Normal colon, appendix, and terminal ileum. Normal small bowel. Vascular/Lymphatic: Age advanced aortic and branch vessel atherosclerosis. Significant decrease in retroperitoneal nodal size. Example left periaortic node measures 7 mm on image 39/series 2 versus 12 mm on the prior exam (when remeasured). Index left external iliac node measures 0.7 cm on image 63/series 2 versus 1.8 cm on the prior. Reproductive: Hysterectomy.  Status post bilateral oophorectomy. Other: Resolution of small volume abdominal ascites. The previously described omental nodularity has resolved. Significant improvement in peritoneal  disease within the pelvis. Residual right pelvic implant measures 1.7 x 1.6 cm on image 62/series 2. This is at the site of infiltrative 4.5 x 2.7 cm soft tissue density mass on the prior exam (when remeasured). Musculoskeletal: Similar small sclerotic lesions within the pelvis, likely bone islands. IMPRESSION: 1. Moderate to marked response to therapy. 2. Resolution of abdominopelvic adenopathy. 3. Resolution of omental nodularity. Significantly improved peritoneal disease within the pelvis. 4. Improvement in trace right-sided caliectasis and proximal right hydroureter. No well-defined obstructive mass. 5. Age advanced aortic atherosclerosis. 6. Mild bladder wall thickening could be due to underdistention and/or cystitis. Electronically Signed   By: Abigail Miyamoto M.D.   On: 01/30/2017 13:04    ASSESSMENT & PLAN:  Endometrial cancer ([place]) She tolerated treatment very well except for leukopenia and mild nausea Clinically, she is gaining weight and  her bowel habits are regular CT scan from 01/30/2017 were reviewed with the patient and her husband which show near complete response to treatment Her recent tumor markers had normalized I will proceed with treatment with minor dose adjustment due to persistent leukopenia  I will see her back in 3 weeks for cycle 6 for treatment  Leukopenia due to antineoplastic chemotherapy ([place]) I will proceed with treatment without dose adjustment I will move her final cycle of treatment and delay for few days to allow recovery of her bone marrow prior to cycle 6 of therapy  Chemotherapy-induced nausea We discussed the importance of adequate hydration and antiemetics If the patient started having issues with dehydration, I reinforced the importance of calling me for IV fluids and IV antiemetics as needed  Drug-induced hyperglycemia She has drug induced hyperglycemia She is not symptomatic Observe only  Goals of care, counseling/discussion We have extensive discussion about goals of care and prognosis Her husband wanted to know what is her likelihood of having long-term cure rate As of this point in time, I told them that she is responding very well to treatment and needs to be observed closely over the next 2 years even after cycle 6 of treatment to monitor for signs of relapse   No orders of the defined types were placed in this encounter.  All questions were answered. The patient knows to call the clinic with any problems, questions or concerns. No barriers to learning was detected. I spent 20 minutes counseling the patient face to face. The total time spent in the appointment was 30 minutes and more than 50% was on counseling and review of test results     Heath Lark, MD 02/22/2017 9:36 AM

## 2017-02-22 NOTE — Assessment & Plan Note (Signed)
We have extensive discussion about goals of care and prognosis Her husband wanted to know what is her likelihood of having long-term cure rate As of this point in time, I told them that she is responding very well to treatment and needs to be observed closely over the next 2 years even after cycle 6 of treatment to monitor for signs of relapse

## 2017-02-22 NOTE — Assessment & Plan Note (Signed)
We discussed the importance of adequate hydration and antiemetics If the patient started having issues with dehydration, I reinforced the importance of calling me for IV fluids and IV antiemetics as needed

## 2017-02-22 NOTE — Assessment & Plan Note (Signed)
I will proceed with treatment without dose adjustment I will move her final cycle of treatment and delay for few days to allow recovery of her bone marrow prior to cycle 6 of therapy

## 2017-03-11 ENCOUNTER — Telehealth: Payer: Self-pay | Admitting: Hematology and Oncology

## 2017-03-11 ENCOUNTER — Telehealth: Payer: Self-pay | Admitting: *Deleted

## 2017-03-11 ENCOUNTER — Other Ambulatory Visit: Payer: Self-pay | Admitting: Hematology and Oncology

## 2017-03-11 DIAGNOSIS — R109 Unspecified abdominal pain: Secondary | ICD-10-CM | POA: Insufficient documentation

## 2017-03-11 DIAGNOSIS — C541 Malignant neoplasm of endometrium: Secondary | ICD-10-CM

## 2017-03-11 NOTE — Telephone Encounter (Signed)
Husband states Kerry Morris had been having abdominal pain. Was told to notified Dr Alvy Bimler if she has any pains, is currently taking chemo. Has been going on for 2 days. States her bowels are moving OK, no nausea or vomiting, no fever. Has been taking tylenol for pain- has been out of oxycodone for a while.

## 2017-03-11 NOTE — Telephone Encounter (Signed)
Scheduled appts per 10/15 sch msg. Patient is aware of appts.

## 2017-03-11 NOTE — Telephone Encounter (Signed)
Very unsual to have pain this far away from recent chemo I can see her tomorrow at 1130 am Would recommend abdominal X ray first at [facility], then check in for labs & urine test at 11 am and see me at 1130 am, 30 mins If OK with her please send scheduling msg

## 2017-03-11 NOTE — Telephone Encounter (Signed)
Pt notified of message below. Will come in tomorrow for Xray, lab and see Dr Alvy Bimler  Msg to scheduler

## 2017-03-12 ENCOUNTER — Encounter: Payer: Self-pay | Admitting: Hematology and Oncology

## 2017-03-12 ENCOUNTER — Ambulatory Visit (HOSPITAL_COMMUNITY)
Admission: RE | Admit: 2017-03-12 | Discharge: 2017-03-12 | Disposition: A | Discharge status: Home / Self Care | Payer: Self-pay | Source: Ambulatory Visit | Attending: Hematology and Oncology | Admitting: Hematology and Oncology

## 2017-03-12 ENCOUNTER — Ambulatory Visit (HOSPITAL_BASED_OUTPATIENT_CLINIC_OR_DEPARTMENT_OTHER): Payer: Self-pay | Admitting: Hematology and Oncology

## 2017-03-12 ENCOUNTER — Other Ambulatory Visit (HOSPITAL_BASED_OUTPATIENT_CLINIC_OR_DEPARTMENT_OTHER): Payer: Self-pay

## 2017-03-12 DIAGNOSIS — R109 Unspecified abdominal pain: Secondary | ICD-10-CM

## 2017-03-12 DIAGNOSIS — C541 Malignant neoplasm of endometrium: Secondary | ICD-10-CM

## 2017-03-12 DIAGNOSIS — D701 Agranulocytosis secondary to cancer chemotherapy: Secondary | ICD-10-CM

## 2017-03-12 DIAGNOSIS — T451X5A Adverse effect of antineoplastic and immunosuppressive drugs, initial encounter: Secondary | ICD-10-CM

## 2017-03-12 LAB — CBC WITH DIFFERENTIAL/PLATELET
BASO%: 0.5 % (ref 0.0–2.0)
Basophils Absolute: 0 10*3/uL (ref 0.0–0.1)
EOS ABS: 0 10*3/uL (ref 0.0–0.5)
EOS%: 0.6 % (ref 0.0–7.0)
HCT: 38.3 % (ref 34.8–46.6)
HGB: 13.1 g/dL (ref 11.6–15.9)
LYMPH%: 53.4 % — AB (ref 14.0–49.7)
MCH: 34 pg (ref 25.1–34.0)
MCHC: 34.2 g/dL (ref 31.5–36.0)
MCV: 99.3 fL (ref 79.5–101.0)
MONO#: 0.4 10*3/uL (ref 0.1–0.9)
MONO%: 15.2 % — ABNORMAL HIGH (ref 0.0–14.0)
NEUT#: 0.7 10*3/uL — ABNORMAL LOW (ref 1.5–6.5)
NEUT%: 30.3 % — AB (ref 38.4–76.8)
PLATELETS: 171 10*3/uL (ref 145–400)
RBC: 3.86 10*6/uL (ref 3.70–5.45)
RDW: 16.9 % — ABNORMAL HIGH (ref 11.2–14.5)
WBC: 2.4 10*3/uL — ABNORMAL LOW (ref 3.9–10.3)
lymph#: 1.3 10*3/uL (ref 0.9–3.3)

## 2017-03-12 LAB — COMPREHENSIVE METABOLIC PANEL
ALT: 15 U/L (ref 0–55)
ANION GAP: 10 meq/L (ref 3–11)
AST: 19 U/L (ref 5–34)
Albumin: 4.2 g/dL (ref 3.5–5.0)
Alkaline Phosphatase: 95 U/L (ref 40–150)
BUN: 8.5 mg/dL (ref 7.0–26.0)
CHLORIDE: 102 meq/L (ref 98–109)
CO2: 28 meq/L (ref 22–29)
CREATININE: 0.8 mg/dL (ref 0.6–1.1)
Calcium: 10.1 mg/dL (ref 8.4–10.4)
EGFR: >60 mL/min/{1.73_m2} (ref >60)
GLUCOSE: 87 mg/dL (ref 70–140)
Potassium: 4.2 mEq/L (ref 3.5–5.1)
Sodium: 140 mEq/L (ref 136–145)
Total Bilirubin: 0.31 mg/dL (ref 0.20–1.20)
Total Protein: 8.2 g/dL (ref 6.4–8.3)

## 2017-03-12 LAB — URINALYSIS, MICROSCOPIC - CHCC
BILIRUBIN (URINE): NEGATIVE
BLOOD: NEGATIVE
Glucose: NEGATIVE mg/dL
KETONES: NEGATIVE mg/dL
NITRITE: NEGATIVE
PH: 6 (ref 4.6–8.0)
Protein: NEGATIVE mg/dL
RBC / HPF: NEGATIVE (ref 0–2)
Specific Gravity, Urine: 1.005 (ref 1.003–1.035)
Urobilinogen, UR: 0.2 mg/dL (ref 0.2–1)

## 2017-03-12 MED ORDER — OXYCODONE HCL 5 MG PO TABS: 5.0000 mg | ORAL_TABLET | ORAL | 0 refills | Status: DC | PRN

## 2017-03-12 MED ORDER — CIPROFLOXACIN HCL 500 MG PO TABS: 500.0000 mg | ORAL_TABLET | Freq: Two times a day (BID) | ORAL | 0 refills | Status: DC

## 2017-03-12 MED FILL — oxyCODONE HCL 5 MG TABS: 5 | 10 days supply | Qty: 60 | Fill #0

## 2017-03-12 MED FILL — CIPROFLOXACIN HCL 500 MG TA: 500 | 7 days supply | Qty: 14 | Fill #0

## 2017-03-12 NOTE — Assessment & Plan Note (Signed)
Symptoms are suggestive of neutropenic colitis Her vital signs are stable I do not believe she needs admission I recommend conservative approach with oral antibiotic therapy, hydration and pain management

## 2017-03-12 NOTE — Assessment & Plan Note (Signed)
The patient has intermittent abdominal pain with each cycle of treatment I suspect she might have neutropenic colitis versus mild flare of diverticulitis Continue supportive care I recommend the patient to stop working I will provide antibiotic coverage  I recommend increase hydration as tolerated and a plan to see her back next week before resuming treatment

## 2017-03-12 NOTE — Progress Notes (Signed)
[facility] OFFICE PROGRESS NOTE  Patient Care Team: Patient, No Pcp Per as PCP - General (General Practice)  SUMMARY OF ONCOLOGIC HISTORY: Oncology History   MSI stable, Her 2 neg, ER patchy positivity     Endometrial cancer ([place])   04/05/2015 Imaging    CT abdomen:  1. Large bilateral hydrosalpinx/pyosalpinx. 2. Enlarged fibroid uterus. 3. Mild bilateral hydronephrosis from #1.       04/06/2015 Procedure    Status post CT-guided drainage of right and left tubo-ovarian abscess, with parallel drains from a right trans gluteal approach. Sample was sent from both the left and the right fluid collections to the lab.      04/19/2015 Imaging    CT abdomen and pelvis: The bilateral pelvic fluid collections have resolved following placement of the percutaneous drainage catheters. Small amount of fluid in the pelvis but no evidence for residual abscess collections or hydrosalpinx. Fibroid uterus. Innumerable uterine fibroids. Many of these fibroids are pedunculated. Development of a small left pleural effusion. Mild fat stranding in the abdominal and pelvic mesentery suggestive for mild edema      05/03/2015 Imaging    US abdomen and pelvis 1. Significantly regressed but not completely resolved bilateral hydrosalpinx/pyosalpinx. Residual is greater on the right (18 mm diameter) with mild fluid complexity. 2. Stable fibroid uterus. 3. No pelvic free fluid      07/06/2015 Pathology Results    PAP smear NEGATIVE FOR INTRAEPITHELIAL LESIONS OR MALIGNANCY.      07/18/2015 Pathology Results    Vulva, biopsy, mass - BENIGN EPIDERMAL CYST. - NO EVIDENCE OF MALIGNANCY      10/21/2016 Initial Diagnosis    She presented to the ER for severe abdominal pain      10/22/2016 Imaging    CT abdomen and pelvis 1. 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 2. Complex partially cystic mass at the left  adnexa, measuring 5.9 x 5.5 x 4.3 cm. This raises concern for primary ovarian malignancy. PET/CT could be considered for further evaluation. 3. Numerous uterine fibroids again noted, some which have increased in size. Underlying endometrial malignancy cannot be excluded. 4. Dilated bilateral fluid collections have reaccumulated within the pelvis, the largest of which measures 15 cm in length. 5. Enlarged bilateral pelvic sidewall nodes measure up to 1.6 cm in short axis, compatible with metastatic disease. Mildly prominent retroperitoneal nodes seen. Mildly prominent nodes about the left inguinal vessels. 6. 1.6 cm cyst at the medial right hepatic lobe.       10/29/2016 - 11/02/2016 Hospital Admission    She was admitted to [facility] for management of newly diagnosed endometrial cancer      10/29/2016 Tumor Marker    Patient's tumor was tested for the following markers: CA125 Results of the tumor marker test revealed 740      10/30/2016 Surgery    Procedure(s): - Open ureteral exploration - Cystourethroscopy - Left ureteral stent placement  Operative Findings:   1.) Normal cystourethrosocpy without lesions, masses or stones. Bilateral ureteral orifices identified with efflux of clear yellow urine 2.) Successful placement of left ureteral stent under visualization with appropriate curl visualized within the bladder and stent palpable within the ureter to the level of the renal pelvis 3) Open exploration determined ureter to be intact and viable.      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) - Carcinoma involves outer half of myometrium, serosal surface, and cervix - Bilateral ovaries / adnexa are positive for high grade serous carcinoma - Extensive lymphovascular space invasion is present - See  synoptic report and comment  B: "Free floating abdominal mass", removal  - Benign cyst with mesothelial-type lining, consistent with peritoneal inclusion cyst, size 7.0 cm - No malignancy identified   C: Posterior cul-de-sac mass, excision  - Involved by high grade serous carcinoma  D: Lymph nodes, left pelvic, resection  - Five of six lymph nodes involved by metastatic high grade serous carcinoma, size of metastases up to 1.5 cm, with no definite extracapsular extension identified (5/6)  E: Lymph nodes, right pelvic, excision  - Four of four lymph nodes involved by metastatic high grade serous carcinoma, size of metastases up to 1.6 cm, with no definite extracapsular extension identified (4/4)  F: Lymph nodes, right para-aortic, resection  - Three of four lymph nodes involved by metastatic high grade serous carcinoma, size of metastases up to 1.7 cm, with no definite extracapsular extension identified (3/4)  G: Omentum, omentectomy  - Involved by metastatic high grade serous carcinoma, size of nodules up to 2.9 cm   Given the presence of a large endometrial mass with serous endometrial intraepithelial carcinoma as well as deep myometrial invasion and cervical and serosal involvement, the tumor is staged as an endometrial primary. However, ovarian primary with spread to the uterus or synchronous primary tumors are also a diagnostic possibility, and definitive site of origin cannot be determined histologically. Extensive lymphovascular space invasion is present, and the tumor involves both ovaries, peritoneum, omentum, and multiple lymph nodes. No distinct fallopian tubes are identified grossly or microscopically; however, they may have been incorporated into the ovarian / adnexal tumor masses.  Immunohistochemical stains are performed on block A11 and demonstrate the tumor is positive for p53 and WT1 and is strongly diffusely positive for p16. It shows patchy focal staining for ER. WT1  staining is also positive in block A15, the right ovary. 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 Interval hysterectomy and likely oophorectomy, and possibly partial omentectomy. There is infiltration and nodularity of the upper omentum compatible with tumor along with complex density along the paracolic gutters, space of Retzius, and presacral space which may also represent tumor infiltration. 2. Soft tissue density favoring tumor along the right posterior piriformis muscle extending towards the vaginal cuff and cul-de-sac region. This likely represents tumor. 3. Stable or minimally increased retroperitoneal and pelvic adenopathy. This is thought to be malignant. 4. Reduced ascites. 5. Mild bilateral hydronephrosis and proximal hydroureter extending down to the iliac vessel cross over is were there is stranding and some mild adenopathy. This likely represents low-grade partial ureteral obstruction and is most likely from extrinsic mass effect. 6. Aortic Atherosclerosis (ICD10-I70.0) and Emphysema (ICD10-J43.9). 7. Presacral and perirectal tumor nodularity.      11/20/2016 Tumor Marker    Patient's tumor was tested for the following markers: CA-125. Results of the tumor marker test revealed 154.2.      11/29/2016 Genetic Testing    Patient has genetic testing done for MSI instability Results revealed patient has no MSI instability      11/30/2016 -  Chemotherapy    She received carboplatin & Taxol  01/04/2017 Genetic Testing    No mutations were identified in 83 gene analyzed for hereditary cancer risk. VUSs were noted in ATM and GPC3.       01/10/2017 Tumor Marker    Patient's tumor was tested for the following markers: CA125 Results of the tumor marker test revealed 23.5      01/30/2017 Imaging    1. Moderate to marked response to therapy. 2. Resolution of abdominopelvic  adenopathy. 3. Resolution of omental nodularity. Significantly improved peritoneal disease within the pelvis. 4. Improvement in trace right-sided caliectasis and proximal right hydroureter. No well-defined obstructive mass. 5. Age advanced aortic atherosclerosis. 6. Mild bladder wall thickening could be due to under distention and/or cystitis.       INTERVAL HISTORY: Please see below for problem oriented charting. She requested to be seen urgently due to periumbilical pain that started approximately 4 days ago It is a constant cramps, relieved with occasional bowel movement She also have pain at her incision site She denies fever or chills No nausea or vomiting She does not feel dehydrated but does have poor appetite She also have mild intermittent lower back pain  REVIEW OF SYSTEMS:   Constitutional: Denies fevers, chills or abnormal weight loss Eyes: Denies blurriness of vision Ears, nose, mouth, throat, and face: Denies mucositis or sore throat Respiratory: Denies cough, dyspnea or wheezes Cardiovascular: Denies palpitation, chest discomfort or lower extremity swelling Skin: Denies abnormal skin rashes Lymphatics: Denies new lymphadenopathy or easy bruising Neurological:Denies numbness, tingling or new weaknesses Behavioral/Psych: Mood is stable, no new changes  All other systems were reviewed with the patient and are negative.  I have reviewed the past medical history, past surgical history, social history and family history with the patient and they are unchanged from previous note.  ALLERGIES:  has No Known Allergies.  MEDICATIONS:  Current Outpatient Prescriptions  Medication Sig Dispense Refill  . acetaminophen (TYLENOL) 325 MG tablet Take 650 mg by mouth every 6 (six) hours as needed.    . ALPRAZolam (XANAX) 0.5 MG tablet Take 0.5 mg by mouth 3 (three) times daily as needed for anxiety.    . ciprofloxacin (CIPRO) 500 MG tablet Take 1 tablet (500 mg total) by mouth 2  (two) times daily. 14 tablet 0  . dexamethasone (DECADRON) 4 MG tablet Take 5 tablets at midnight and 5 tablets at 6 am the morning of treatment, by mouth, every 21 days 30 tablet 1  . ondansetron (ZOFRAN) 8 MG tablet Take 1 tablet (8 mg total) by mouth 2 (two) times daily as needed for refractory nausea / vomiting. Start on day 3 after chemo. 30 tablet 1  . oxyCODONE (OXY IR/ROXICODONE) 5 MG immediate release tablet Take 1 tablet (5 mg total) by mouth every 4 (four) hours as needed for severe pain. 60 tablet 0  . oxyCODONE (OXY IR/ROXICODONE) 5 MG immediate release tablet Take 1 tablet (5 mg total) by mouth every 4 (four) hours as needed for severe pain. 60 tablet 0  . prochlorperazine (COMPAZINE) 10 MG tablet Take 1 tablet (10 mg total) by mouth every 6 (six) hours as needed (Nausea or vomiting). 30 tablet 1  . simethicone (MYLICON) 80 MG chewable tablet Chew 80 mg by mouth 4 (four) times daily as needed.     No current facility-administered medications for this visit.     PHYSICAL EXAMINATION: ECOG PERFORMANCE STATUS: 1 - Symptomatic but completely ambulatory  Vitals:   03/12/17 1039  BP: 136/86  Pulse: 90  Resp: 18  Temp: 98.4 F (36.9 C)  SpO2: 99%   Filed Weights   03/12/17 1039  Weight: 115 lb 1.6 oz (52.2 kg)    GENERAL:alert, no distress and comfortable SKIN: skin color, texture, turgor are normal, no rashes or significant lesions EYES: normal, Conjunctiva are pink and non-injected, sclera clear OROPHARYNX:no exudate, no erythema and lips, buccal mucosa, and tongue normal  NECK: supple, thyroid normal size, non-tender, without nodularity LYMPH:  no palpable lymphadenopathy in the cervical, axillary or inguinal LUNGS: clear to auscultation and percussion with normal breathing effort HEART: regular rate & rhythm and no murmurs and no lower extremity edema ABDOMEN:abdomen soft, tenderness on gentle palpation, no rebound or guarding, diffuse near the periumbilical  region Musculoskeletal:no cyanosis of digits and no clubbing  NEURO: alert & oriented x 3 with fluent speech, no focal motor/sensory deficits  LABORATORY DATA:  I have reviewed the data as listed    Component Value Date/Time   NA 140 03/12/2017 1023   K 4.2 03/12/2017 1023   CL 101 10/22/2016 2224   CO2 28 03/12/2017 1023   GLUCOSE 87 03/12/2017 1023   BUN 8.5 03/12/2017 1023   CREATININE 0.8 03/12/2017 1023   CALCIUM 10.1 03/12/2017 1023   PROT 8.2 03/12/2017 1023   ALBUMIN 4.2 03/12/2017 1023   AST 19 03/12/2017 1023   ALT 15 03/12/2017 1023   ALKPHOS 95 03/12/2017 1023   BILITOT 0.31 03/12/2017 1023   GFRNONAA >60 10/22/2016 2224   GFRAA >60 10/22/2016 2224    No results found for: SPEP, UPEP  Lab Results  Component Value Date   WBC 2.4 (L) 03/12/2017   NEUTROABS 0.7 (L) 03/12/2017   HGB 13.1 03/12/2017   HCT 38.3 03/12/2017   MCV 99.3 03/12/2017   PLT 171 03/12/2017      Chemistry      Component Value Date/Time   NA 140 03/12/2017 1023   K 4.2 03/12/2017 1023   CL 101 10/22/2016 2224   CO2 28 03/12/2017 1023   BUN 8.5 03/12/2017 1023   CREATININE 0.8 03/12/2017 1023      Component Value Date/Time   CALCIUM 10.1 03/12/2017 1023   ALKPHOS 95 03/12/2017 1023   AST 19 03/12/2017 1023   ALT 15 03/12/2017 1023   BILITOT 0.31 03/12/2017 1023       RADIOGRAPHIC STUDIES: I have personally reviewed the radiological images as listed and agreed with the findings in the report. Dg Abd 2 Views  Result Date: 03/12/2017 CLINICAL DATA:  Abdominal pain. History of endometrial cancer, currently on chemotherapy. EXAM: ABDOMEN - 2 VIEW COMPARISON:  CT abdomen pelvis dated January 30, 2017. FINDINGS: The bowel gas pattern is normal. There is no evidence of free air. No radio-opaque calculi or other significant radiographic abnormality is seen. Surgical clips in the left pelvis. IMPRESSION: Negative. Electronically Signed   By: Obie Dredge M.D.   On: 03/12/2017 09:56     ASSESSMENT & PLAN:  Endometrial cancer (HCC) The patient has intermittent abdominal pain with each cycle of treatment I suspect she might have neutropenic colitis versus mild flare of diverticulitis Continue supportive care I recommend the patient to stop working I will provide antibiotic coverage  I recommend increase hydration as tolerated and a plan to see her back next week before resuming treatment  Leukopenia due to antineoplastic chemotherapy (HCC) Symptoms are suggestive of neutropenic colitis Her vital signs are stable I do not believe she needs admission I recommend conservative approach with oral antibiotic therapy, hydration  and pain management  Abdominal pain Abdominal x-ray is benign I recommend antibiotic therapy and pain management I prescribed prescription oxycodone to take as needed and I did warn her about risk of sedation and constipation   No orders of the defined types were placed in this encounter.  All questions were answered. The patient knows to call the clinic with any problems, questions or concerns. No barriers to learning was detected. I spent 20 minutes counseling the patient face to face. The total time spent in the appointment was 30 minutes and more than 50% was on counseling and review of test results     Heath Lark, MD 03/12/2017 11:19 AM

## 2017-03-12 NOTE — Assessment & Plan Note (Signed)
Abdominal x-ray is benign I recommend antibiotic therapy and pain management I prescribed prescription oxycodone to take as needed and I did warn her about risk of sedation and constipation

## 2017-03-13 LAB — URINE CULTURE: ORGANISM ID, BACTERIA: NO GROWTH

## 2017-03-14 ENCOUNTER — Ambulatory Visit: Payer: No Typology Code available for payment source

## 2017-03-14 ENCOUNTER — Other Ambulatory Visit: Payer: No Typology Code available for payment source

## 2017-03-14 ENCOUNTER — Ambulatory Visit: Payer: No Typology Code available for payment source | Admitting: Hematology and Oncology

## 2017-03-18 ENCOUNTER — Ambulatory Visit (HOSPITAL_BASED_OUTPATIENT_CLINIC_OR_DEPARTMENT_OTHER): Payer: Self-pay | Admitting: Hematology and Oncology

## 2017-03-18 ENCOUNTER — Encounter: Payer: Self-pay | Admitting: Hematology and Oncology

## 2017-03-18 ENCOUNTER — Other Ambulatory Visit (HOSPITAL_BASED_OUTPATIENT_CLINIC_OR_DEPARTMENT_OTHER): Payer: Self-pay

## 2017-03-18 ENCOUNTER — Ambulatory Visit (HOSPITAL_BASED_OUTPATIENT_CLINIC_OR_DEPARTMENT_OTHER): Payer: Self-pay

## 2017-03-18 ENCOUNTER — Other Ambulatory Visit: Payer: Self-pay | Admitting: Hematology and Oncology

## 2017-03-18 DIAGNOSIS — D701 Agranulocytosis secondary to cancer chemotherapy: Secondary | ICD-10-CM

## 2017-03-18 DIAGNOSIS — R109 Unspecified abdominal pain: Secondary | ICD-10-CM

## 2017-03-18 DIAGNOSIS — Z5111 Encounter for antineoplastic chemotherapy: Secondary | ICD-10-CM

## 2017-03-18 DIAGNOSIS — T451X5A Adverse effect of antineoplastic and immunosuppressive drugs, initial encounter: Secondary | ICD-10-CM

## 2017-03-18 DIAGNOSIS — C541 Malignant neoplasm of endometrium: Secondary | ICD-10-CM

## 2017-03-18 LAB — CBC WITH DIFFERENTIAL/PLATELET
BASO%: 0.1 % (ref 0.0–2.0)
Basophils Absolute: 0 10*3/uL (ref 0.0–0.1)
EOS ABS: 0 10*3/uL (ref 0.0–0.5)
EOS%: 0 % (ref 0.0–7.0)
HEMATOCRIT: 36.9 % (ref 34.8–46.6)
HEMOGLOBIN: 12.6 g/dL (ref 11.6–15.9)
LYMPH#: 0.5 10*3/uL — AB (ref 0.9–3.3)
LYMPH%: 13.6 % — AB (ref 14.0–49.7)
MCH: 34.2 pg — ABNORMAL HIGH (ref 25.1–34.0)
MCHC: 34.1 g/dL (ref 31.5–36.0)
MCV: 100.3 fL (ref 79.5–101.0)
MONO#: 0 10*3/uL — AB (ref 0.1–0.9)
MONO%: 1.1 % (ref 0.0–14.0)
NEUT%: 85.2 % — ABNORMAL HIGH (ref 38.4–76.8)
NEUTROS ABS: 3 10*3/uL (ref 1.5–6.5)
PLATELETS: 181 10*3/uL (ref 145–400)
RBC: 3.68 10*6/uL — AB (ref 3.70–5.45)
RDW: 15.9 % — AB (ref 11.2–14.5)
WBC: 3.5 10*3/uL — ABNORMAL LOW (ref 3.9–10.3)

## 2017-03-18 LAB — COMPREHENSIVE METABOLIC PANEL
ALBUMIN: 4.1 g/dL (ref 3.5–5.0)
ALK PHOS: 73 U/L (ref 40–150)
ALT: 16 U/L (ref 0–55)
ANION GAP: 11 meq/L (ref 3–11)
AST: 19 U/L (ref 5–34)
BILIRUBIN TOTAL: 0.29 mg/dL (ref 0.20–1.20)
BUN: 13.8 mg/dL (ref 7.0–26.0)
CALCIUM: 9.8 mg/dL (ref 8.4–10.4)
CO2: 25 mEq/L (ref 22–29)
CREATININE: 0.8 mg/dL (ref 0.6–1.1)
Chloride: 104 mEq/L (ref 98–109)
Glucose: 170 mg/dl — ABNORMAL HIGH (ref 70–140)
Potassium: 4.1 mEq/L (ref 3.5–5.1)
Sodium: 140 mEq/L (ref 136–145)
TOTAL PROTEIN: 7.7 g/dL (ref 6.4–8.3)

## 2017-03-18 MED ORDER — CARBOPLATIN CHEMO INJECTION 600 MG/60ML
500.0000 mg | Freq: Once | INTRAVENOUS | Status: AC
  Administered 2017-03-18: 500 mg via INTRAVENOUS
  Filled 2017-03-18: qty 50

## 2017-03-18 MED ORDER — SODIUM CHLORIDE 0.9 % IV SOLN
Freq: Once | INTRAVENOUS | Status: AC
  Administered 2017-03-18: 11:00:00 via INTRAVENOUS
  Filled 2017-03-18: qty 5

## 2017-03-18 MED ORDER — SODIUM CHLORIDE 0.9 % IV SOLN
50.0000 mg | Freq: Once | INTRAVENOUS | Status: AC
  Administered 2017-03-18: 50 mg via INTRAVENOUS
  Filled 2017-03-18: qty 1

## 2017-03-18 MED ORDER — FAMOTIDINE IN NACL 20-0.9 MG/50ML-% IV SOLN
INTRAVENOUS | Status: AC
  Filled 2017-03-18: qty 50

## 2017-03-18 MED ORDER — FAMOTIDINE IN NACL 20-0.9 MG/50ML-% IV SOLN
20.0000 mg | Freq: Once | INTRAVENOUS | Status: AC
  Administered 2017-03-18: 20 mg via INTRAVENOUS

## 2017-03-18 MED ORDER — PALONOSETRON HCL INJECTION 0.25 MG/5ML
INTRAVENOUS | Status: AC
  Filled 2017-03-18: qty 5

## 2017-03-18 MED ORDER — SODIUM CHLORIDE 0.9 % IV SOLN
Freq: Once | INTRAVENOUS | Status: AC
  Administered 2017-03-18: 10:00:00 via INTRAVENOUS

## 2017-03-18 MED ORDER — SODIUM CHLORIDE 0.9 % IV SOLN
140.0000 mg/m2 | Freq: Once | INTRAVENOUS | Status: AC
  Administered 2017-03-18: 204 mg via INTRAVENOUS
  Filled 2017-03-18: qty 34

## 2017-03-18 MED ORDER — PALONOSETRON HCL INJECTION 0.25 MG/5ML
0.2500 mg | Freq: Once | INTRAVENOUS | Status: AC
  Administered 2017-03-18: 0.25 mg via INTRAVENOUS

## 2017-03-18 NOTE — Progress Notes (Unsigned)
1235: pt reports that her PIV "is leaking, I just woke up and its leaking from somewhere" small amount of fluid noted on patient's personal bag and around PIV dressing. Taxol stopped. PIV flushed without difficultly and blood return noted. Cleaned area and educated pt on washing personal items. 1349: Dressing changed and area cleaned throughly and capped tightened.  1350: PIV unable to flush, saline infiltrated. PIV removed. Pt went to restroom and cleaned hands and arms with soap and water.  Pharmacy aware. Dr. Alvy Bimler aware, Per Dr. Rodman Key remainder of taxol and give Carboplatin once new PIV obtained. No further orders at this time. Pt aware and verbalizes understanding.  1500: Taxol restarted.  1530: Per Arbie Cookey in pharmacy okay to proceed with Carboplatin dose of 500, per Arbie Cookey per Dr. Calton Dach note pt unable to tolerate increased dose.

## 2017-03-18 NOTE — Patient Instructions (Signed)
[facility] °Discharge Instructions for Patients Receiving Chemotherapy ° °Today you received the following chemotherapy agents Taxol; Carboplatin ° °To help prevent nausea and vomiting after your treatment, we encourage you to take your nausea medication as directed °  °If you develop nausea and vomiting that is not controlled by your nausea medication, call the clinic.  ° °BELOW ARE SYMPTOMS THAT SHOULD BE REPORTED IMMEDIATELY: °· *FEVER GREATER THAN 100.5 F °· *CHILLS WITH OR WITHOUT FEVER °· NAUSEA AND VOMITING THAT IS NOT CONTROLLED WITH YOUR NAUSEA MEDICATION °· *UNUSUAL SHORTNESS OF BREATH °· *UNUSUAL BRUISING OR BLEEDING °· TENDERNESS IN MOUTH AND THROAT WITH OR WITHOUT PRESENCE OF ULCERS °· *URINARY PROBLEMS °· *BOWEL PROBLEMS °· UNUSUAL RASH °Items with * indicate a potential emergency and should be followed up as soon as possible. ° °Feel free to call the clinic should you have any questions or concerns. The clinic phone number is [phone]. ° °Please show the CHEMO ALERT CARD at check-in to the Emergency Department and triage nurse. ° ° °

## 2017-03-19 ENCOUNTER — Other Ambulatory Visit: Payer: Self-pay | Admitting: Hematology and Oncology

## 2017-03-19 ENCOUNTER — Encounter: Payer: Self-pay | Admitting: Hematology and Oncology

## 2017-03-19 DIAGNOSIS — C541 Malignant neoplasm of endometrium: Secondary | ICD-10-CM

## 2017-03-19 NOTE — Progress Notes (Signed)
[facility] OFFICE PROGRESS NOTE  Patient Care Team: Patient, No Pcp Per as PCP - General (General Practice)  SUMMARY OF ONCOLOGIC HISTORY: Oncology History   MSI stable, Her 2 neg, ER patchy positivity     Endometrial cancer ([place])   04/05/2015 Imaging    CT abdomen:  1. Large bilateral hydrosalpinx/pyosalpinx. 2. Enlarged fibroid uterus. 3. Mild bilateral hydronephrosis from #1.       04/06/2015 Procedure    Status post CT-guided drainage of right and left tubo-ovarian abscess, with parallel drains from a right trans gluteal approach. Sample was sent from both the left and the right fluid collections to the lab.      04/19/2015 Imaging    CT abdomen and pelvis: The bilateral pelvic fluid collections have resolved following placement of the percutaneous drainage catheters. Small amount of fluid in the pelvis but no evidence for residual abscess collections or hydrosalpinx. Fibroid uterus. Innumerable uterine fibroids. Many of these fibroids are pedunculated. Development of a small left pleural effusion. Mild fat stranding in the abdominal and pelvic mesentery suggestive for mild edema      05/03/2015 Imaging    US abdomen and pelvis 1. Significantly regressed but not completely resolved bilateral hydrosalpinx/pyosalpinx. Residual is greater on the right (18 mm diameter) with mild fluid complexity. 2. Stable fibroid uterus. 3. No pelvic free fluid      07/06/2015 Pathology Results    PAP smear NEGATIVE FOR INTRAEPITHELIAL LESIONS OR MALIGNANCY.      07/18/2015 Pathology Results    Vulva, biopsy, mass - BENIGN EPIDERMAL CYST. - NO EVIDENCE OF MALIGNANCY      10/21/2016 Initial Diagnosis    She presented to the ER for severe abdominal pain      10/22/2016 Imaging    CT abdomen and pelvis 1. 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 2. Complex partially cystic mass at the left  adnexa, measuring 5.9 x 5.5 x 4.3 cm. This raises concern for primary ovarian malignancy. PET/CT could be considered for further evaluation. 3. Numerous uterine fibroids again noted, some which have increased in size. Underlying endometrial malignancy cannot be excluded. 4. Dilated bilateral fluid collections have reaccumulated within the pelvis, the largest of which measures 15 cm in length. 5. Enlarged bilateral pelvic sidewall nodes measure up to 1.6 cm in short axis, compatible with metastatic disease. Mildly prominent retroperitoneal nodes seen. Mildly prominent nodes about the left inguinal vessels. 6. 1.6 cm cyst at the medial right hepatic lobe.       10/29/2016 - 11/02/2016 Hospital Admission    She was admitted to [facility] for management of newly diagnosed endometrial cancer      10/29/2016 Tumor Marker    Patient's tumor was tested for the following markers: CA125 Results of the tumor marker test revealed 740      10/30/2016 Surgery    Procedure(s): - Open ureteral exploration - Cystourethroscopy - Left ureteral stent placement  Operative Findings:   1.) Normal cystourethrosocpy without lesions, masses or stones. Bilateral ureteral orifices identified with efflux of clear yellow urine 2.) Successful placement of left ureteral stent under visualization with appropriate curl visualized within the bladder and stent palpable within the ureter to the level of the renal pelvis 3) Open exploration determined ureter to be intact and viable.      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) - Carcinoma involves outer half of myometrium, serosal surface, and cervix - Bilateral ovaries / adnexa are positive for high grade serous carcinoma - Extensive lymphovascular space invasion is present - See  synoptic report and comment  B: "Free floating abdominal mass", removal  - Benign cyst with mesothelial-type lining, consistent with peritoneal inclusion cyst, size 7.0 cm - No malignancy identified   C: Posterior cul-de-sac mass, excision  - Involved by high grade serous carcinoma  D: Lymph nodes, left pelvic, resection  - Five of six lymph nodes involved by metastatic high grade serous carcinoma, size of metastases up to 1.5 cm, with no definite extracapsular extension identified (5/6)  E: Lymph nodes, right pelvic, excision  - Four of four lymph nodes involved by metastatic high grade serous carcinoma, size of metastases up to 1.6 cm, with no definite extracapsular extension identified (4/4)  F: Lymph nodes, right para-aortic, resection  - Three of four lymph nodes involved by metastatic high grade serous carcinoma, size of metastases up to 1.7 cm, with no definite extracapsular extension identified (3/4)  G: Omentum, omentectomy  - Involved by metastatic high grade serous carcinoma, size of nodules up to 2.9 cm   Given the presence of a large endometrial mass with serous endometrial intraepithelial carcinoma as well as deep myometrial invasion and cervical and serosal involvement, the tumor is staged as an endometrial primary. However, ovarian primary with spread to the uterus or synchronous primary tumors are also a diagnostic possibility, and definitive site of origin cannot be determined histologically. Extensive lymphovascular space invasion is present, and the tumor involves both ovaries, peritoneum, omentum, and multiple lymph nodes. No distinct fallopian tubes are identified grossly or microscopically; however, they may have been incorporated into the ovarian / adnexal tumor masses.  Immunohistochemical stains are performed on block A11 and demonstrate the tumor is positive for p53 and WT1 and is strongly diffusely positive for p16. It shows patchy focal staining for ER. WT1  staining is also positive in block A15, the right ovary. 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 Interval hysterectomy and likely oophorectomy, and possibly partial omentectomy. There is infiltration and nodularity of the upper omentum compatible with tumor along with complex density along the paracolic gutters, space of Retzius, and presacral space which may also represent tumor infiltration. 2. Soft tissue density favoring tumor along the right posterior piriformis muscle extending towards the vaginal cuff and cul-de-sac region. This likely represents tumor. 3. Stable or minimally increased retroperitoneal and pelvic adenopathy. This is thought to be malignant. 4. Reduced ascites. 5. Mild bilateral hydronephrosis and proximal hydroureter extending down to the iliac vessel cross over is were there is stranding and some mild adenopathy. This likely represents low-grade partial ureteral obstruction and is most likely from extrinsic mass effect. 6. Aortic Atherosclerosis (ICD10-I70.0) and Emphysema (ICD10-J43.9). 7. Presacral and perirectal tumor nodularity.      11/20/2016 Tumor Marker    Patient's tumor was tested for the following markers: CA-125. Results of the tumor marker test revealed 154.2.      11/29/2016 Genetic Testing    Patient has genetic testing done for MSI instability Results revealed patient has no MSI instability      11/30/2016 -  Chemotherapy    She received carboplatin & Taxol  01/04/2017 Genetic Testing    No mutations were identified in 83 gene analyzed for hereditary cancer risk. VUSs were noted in ATM and GPC3.       01/10/2017 Tumor Marker    Patient's tumor was tested for the following markers: CA125 Results of the tumor marker test revealed 23.5      01/30/2017 Imaging    1. Moderate to marked response to therapy. 2. Resolution of abdominopelvic  adenopathy. 3. Resolution of omental nodularity. Significantly improved peritoneal disease within the pelvis. 4. Improvement in trace right-sided caliectasis and proximal right hydroureter. No well-defined obstructive mass. 5. Age advanced aortic atherosclerosis. 6. Mild bladder wall thickening could be due to under distention and/or cystitis.       INTERVAL HISTORY: Please see below for problem oriented charting. She returns with her husband for final chemotherapy Her abdominal pain has subsided She denies diarrhea or constipation No nausea vomiting No fever She denies peripheral neuropathy  REVIEW OF SYSTEMS:   Constitutional: Denies fevers, chills or abnormal weight loss Eyes: Denies blurriness of vision Ears, nose, mouth, throat, and face: Denies mucositis or sore throat Respiratory: Denies cough, dyspnea or wheezes Cardiovascular: Denies palpitation, chest discomfort or lower extremity swelling Gastrointestinal:  Denies nausea, heartburn or change in bowel habits Skin: Denies abnormal skin rashes Lymphatics: Denies new lymphadenopathy or easy bruising Neurological:Denies numbness, tingling or new weaknesses Behavioral/Psych: Mood is stable, no new changes  All other systems were reviewed with the patient and are negative.  I have reviewed the past medical history, past surgical history, social history and family history with the patient and they are unchanged from previous note.  ALLERGIES:  has No Known Allergies.  MEDICATIONS:  Current Outpatient Prescriptions  Medication Sig Dispense Refill  . acetaminophen (TYLENOL) 325 MG tablet Take 650 mg by mouth every 6 (six) hours as needed.    . ALPRAZolam (XANAX) 0.5 MG tablet Take 0.5 mg by mouth 3 (three) times daily as needed for anxiety.    . ciprofloxacin (CIPRO) 500 MG tablet Take 1 tablet (500 mg total) by mouth 2 (two) times daily. 14 tablet 0  . dexamethasone (DECADRON) 4 MG tablet Take 5 tablets at midnight and 5  tablets at 6 am the morning of treatment, by mouth, every 21 days 30 tablet 1  . ondansetron (ZOFRAN) 8 MG tablet Take 1 tablet (8 mg total) by mouth 2 (two) times daily as needed for refractory nausea / vomiting. Start on day 3 after chemo. 30 tablet 1  . oxyCODONE (OXY IR/ROXICODONE) 5 MG immediate release tablet Take 1 tablet (5 mg total) by mouth every 4 (four) hours as needed for severe pain. 60 tablet 0  . oxyCODONE (OXY IR/ROXICODONE) 5 MG immediate release tablet Take 1 tablet (5 mg total) by mouth every 4 (four) hours as needed for severe pain. 60 tablet 0  . prochlorperazine (COMPAZINE) 10 MG tablet Take 1 tablet (10 mg total) by mouth every 6 (six) hours as needed (Nausea or vomiting). 30 tablet 1  . simethicone (MYLICON) 80 MG chewable tablet Chew 80 mg by mouth 4 (four) times daily as needed.     No current facility-administered medications for this visit.     PHYSICAL EXAMINATION: ECOG PERFORMANCE STATUS: 1 - Symptomatic but completely ambulatory  Vitals:   03/18/17 0916  BP: 103/67  Pulse: 92  Resp: 17  Temp: 98.2 F (36.8 C)  SpO2: 99%   Filed Weights   03/18/17 0916  Weight: 115 lb 3.2 oz (52.3  kg)    GENERAL:alert, no distress and comfortable SKIN: skin color, texture, turgor are normal, no rashes or significant lesions EYES: normal, Conjunctiva are pink and non-injected, sclera clear OROPHARYNX:no exudate, no erythema and lips, buccal mucosa, and tongue normal  NECK: supple, thyroid normal size, non-tender, without nodularity LYMPH:  no palpable lymphadenopathy in the cervical, axillary or inguinal LUNGS: clear to auscultation and percussion with normal breathing effort HEART: regular rate & rhythm and no murmurs and no lower extremity edema ABDOMEN:abdomen soft, non-tender and normal bowel sounds Musculoskeletal:no cyanosis of digits and no clubbing  NEURO: alert & oriented x 3 with fluent speech, no focal motor/sensory deficits  LABORATORY DATA:  I have  reviewed the data as listed    Component Value Date/Time   NA 140 03/18/2017 0855   K 4.1 03/18/2017 0855   CL 101 10/22/2016 2224   CO2 25 03/18/2017 0855   GLUCOSE 170 (H) 03/18/2017 0855   BUN 13.8 03/18/2017 0855   CREATININE 0.8 03/18/2017 0855   CALCIUM 9.8 03/18/2017 0855   PROT 7.7 03/18/2017 0855   ALBUMIN 4.1 03/18/2017 0855   AST 19 03/18/2017 0855   ALT 16 03/18/2017 0855   ALKPHOS 73 03/18/2017 0855   BILITOT 0.29 03/18/2017 0855   GFRNONAA >60 10/22/2016 2224   GFRAA >60 10/22/2016 2224    No results found for: SPEP, UPEP  Lab Results  Component Value Date   WBC 3.5 (L) 03/18/2017   NEUTROABS 3.0 03/18/2017   HGB 12.6 03/18/2017   HCT 36.9 03/18/2017   MCV 100.3 03/18/2017   PLT 181 03/18/2017      Chemistry      Component Value Date/Time   NA 140 03/18/2017 0855   K 4.1 03/18/2017 0855   CL 101 10/22/2016 2224   CO2 25 03/18/2017 0855   BUN 13.8 03/18/2017 0855   CREATININE 0.8 03/18/2017 0855      Component Value Date/Time   CALCIUM 9.8 03/18/2017 0855   ALKPHOS 73 03/18/2017 0855   AST 19 03/18/2017 0855   ALT 16 03/18/2017 0855   BILITOT 0.29 03/18/2017 0855       RADIOGRAPHIC STUDIES: I have personally reviewed the radiological images as listed and agreed with the findings in the report. Dg Abd 2 Views  Result Date: 03/12/2017 CLINICAL DATA:  Abdominal pain. History of endometrial cancer, currently on chemotherapy. EXAM: ABDOMEN - 2 VIEW COMPARISON:  CT abdomen pelvis dated January 30, 2017. FINDINGS: The bowel gas pattern is normal. There is no evidence of free air. No radio-opaque calculi or other significant radiographic abnormality is seen. Surgical clips in the left pelvis. IMPRESSION: Negative. Electronically Signed   By: Titus Dubin M.D.   On: 03/12/2017 09:56    ASSESSMENT & PLAN:  Endometrial cancer [facility]) She will complete final chemotherapy without further dose adjustment She will be due for repeat imaging study in  December I will coordinate follow-up with her GYN oncologist  Leukopenia due to antineoplastic chemotherapy [facility]) This is likely due to recent treatment. The patient denies recent history of fevers, cough, chills, diarrhea or dysuria. She is asymptomatic from the leukopenia. I will observe for now.  I will continue the chemotherapy at current dose without dosage adjustment.  If the leukopenia gets progressive worse in the future, I might have to delay her treatment or adjust the chemotherapy dose.    Abdominal pain Recent abdominal pain has resolved She has completed recently a course of antibiotic therapy She will continue to take  oxycodone as needed but I do not believe she would need long-term pain medicine   No orders of the defined types were placed in this encounter.  All questions were answered. The patient knows to call the clinic with any problems, questions or concerns. No barriers to learning was detected. I spent 15 minutes counseling the patient face to face. The total time spent in the appointment was 20 minutes and more than 50% was on counseling and review of test results     Heath Lark, MD 03/19/2017 7:30 AM

## 2017-03-19 NOTE — Assessment & Plan Note (Signed)
This is likely due to recent treatment. The patient denies recent history of fevers, cough, chills, diarrhea or dysuria. She is asymptomatic from the leukopenia. I will observe for now.  I will continue the chemotherapy at current dose without dosage adjustment.  If the leukopenia gets progressive worse in the future, I might have to delay her treatment or adjust the chemotherapy dose.   

## 2017-03-19 NOTE — Assessment & Plan Note (Signed)
She will complete final chemotherapy without further dose adjustment She will be due for repeat imaging study in December I will coordinate follow-up with her GYN oncologist

## 2017-03-19 NOTE — Assessment & Plan Note (Signed)
Recent abdominal pain has resolved She has completed recently a course of antibiotic therapy She will continue to take oxycodone as needed but I do not believe she would need long-term pain medicine

## 2017-03-21 ENCOUNTER — Telehealth: Payer: Self-pay | Admitting: *Deleted

## 2017-03-21 NOTE — Telephone Encounter (Signed)
Called and left a message for the patient to call the office back.

## 2017-03-22 ENCOUNTER — Telehealth: Payer: Self-pay | Admitting: *Deleted

## 2017-03-22 NOTE — Telephone Encounter (Signed)
Called and spoke with the patient. Gave the date/time of December 6th at 8am , arrive at 7:45am for the CT scan. Patient to come by either the office or radiology to pick up her oral contrast. Patient also given the appt of December 7th at 10:45am to see Clarke-Pearson.

## 2017-04-03 ENCOUNTER — Telehealth: Payer: Self-pay

## 2017-04-03 NOTE — Telephone Encounter (Signed)
Husband showed up in the lobby and wanted to see nurse. Wife has been having pain for the last 3 days.  Called patient at home. She is having a lot of pain in her abdomen and bone pain also. Rates it at a 2 now, sometimes it gets up to a 10. Recently completed chemotherapy. Last BM was yesterday, it was a constipated stool. Taking Senokot 2 x day, recently started 2 x day. Will start Miralax tonight to help with constipation. She is not taking her pain medication as often as she can take it, because she is constipated.  Needs refill on pain medication, will run out in a couple of days. Instructed to go to ER if her symptoms get worse. Nurse will call in am.

## 2017-04-04 ENCOUNTER — Telehealth: Payer: Self-pay | Admitting: Hematology and Oncology

## 2017-04-04 ENCOUNTER — Encounter: Payer: Self-pay | Admitting: Hematology and Oncology

## 2017-04-04 ENCOUNTER — Ambulatory Visit (HOSPITAL_BASED_OUTPATIENT_CLINIC_OR_DEPARTMENT_OTHER): Payer: Self-pay | Admitting: Hematology and Oncology

## 2017-04-04 ENCOUNTER — Ambulatory Visit (HOSPITAL_BASED_OUTPATIENT_CLINIC_OR_DEPARTMENT_OTHER): Payer: Self-pay

## 2017-04-04 VITALS — BP 158/78 | HR 64

## 2017-04-04 VITALS — BP 122/90 | HR 113 | Temp 98.1°F | Resp 20 | Ht 65.0 in | Wt 111.1 lb

## 2017-04-04 DIAGNOSIS — D701 Agranulocytosis secondary to cancer chemotherapy: Secondary | ICD-10-CM

## 2017-04-04 DIAGNOSIS — C541 Malignant neoplasm of endometrium: Secondary | ICD-10-CM

## 2017-04-04 DIAGNOSIS — E860 Dehydration: Secondary | ICD-10-CM

## 2017-04-04 DIAGNOSIS — R1030 Lower abdominal pain, unspecified: Secondary | ICD-10-CM

## 2017-04-04 DIAGNOSIS — T451X5A Adverse effect of antineoplastic and immunosuppressive drugs, initial encounter: Secondary | ICD-10-CM

## 2017-04-04 LAB — CBC WITH DIFFERENTIAL/PLATELET
BASO%: 0.2 % (ref 0.0–2.0)
BASOS ABS: 0 10*3/uL (ref 0.0–0.1)
EOS ABS: 0 10*3/uL (ref 0.0–0.5)
EOS%: 0.4 % (ref 0.0–7.0)
HCT: 38.4 % (ref 34.8–46.6)
HGB: 13.1 g/dL (ref 11.6–15.9)
LYMPH%: 61.5 % — AB (ref 14.0–49.7)
MCH: 34.3 pg — AB (ref 25.1–34.0)
MCHC: 34.1 g/dL (ref 31.5–36.0)
MCV: 100.6 fL (ref 79.5–101.0)
MONO#: 0.5 10*3/uL (ref 0.1–0.9)
MONO%: 18.8 % — AB (ref 0.0–14.0)
NEUT%: 19.1 % — AB (ref 38.4–76.8)
NEUTROS ABS: 0.5 10*3/uL — AB (ref 1.5–6.5)
Platelets: 223 10*3/uL (ref 145–400)
RBC: 3.82 10*6/uL (ref 3.70–5.45)
RDW: 15.3 % — ABNORMAL HIGH (ref 11.2–14.5)
WBC: 2.8 10*3/uL — AB (ref 3.9–10.3)
lymph#: 1.7 10*3/uL (ref 0.9–3.3)

## 2017-04-04 LAB — COMPREHENSIVE METABOLIC PANEL
ALBUMIN: 4.4 g/dL (ref 3.5–5.0)
ALK PHOS: 79 U/L (ref 40–150)
ALT: 17 U/L (ref 0–55)
AST: 21 U/L (ref 5–34)
Anion Gap: 10 mEq/L (ref 3–11)
BUN: 9 mg/dL (ref 7.0–26.0)
CALCIUM: 10.4 mg/dL (ref 8.4–10.4)
CO2: 26 mEq/L (ref 22–29)
Chloride: 101 mEq/L (ref 98–109)
Creatinine: 0.7 mg/dL (ref 0.6–1.1)
Glucose: 117 mg/dl (ref 70–140)
POTASSIUM: 4.1 meq/L (ref 3.5–5.1)
SODIUM: 138 meq/L (ref 136–145)
Total Bilirubin: 0.27 mg/dL (ref 0.20–1.20)
Total Protein: 8.1 g/dL (ref 6.4–8.3)

## 2017-04-04 MED ORDER — HYDROMORPHONE HCL 4 MG PO TABS: 6.0000 mg | ORAL_TABLET | ORAL | 0 refills | Status: DC | PRN

## 2017-04-04 MED ORDER — TBO-FILGRASTIM 300 MCG/0.5ML ~~LOC~~ SOSY
300.0000 ug | PREFILLED_SYRINGE | Freq: Once | SUBCUTANEOUS | Status: AC
  Administered 2017-04-04: 300 ug via SUBCUTANEOUS
  Filled 2017-04-04: qty 0.5

## 2017-04-04 MED ORDER — HYDROMORPHONE HCL 2 MG/ML IJ SOLN
INTRAMUSCULAR | Status: AC
  Filled 2017-04-04: qty 1

## 2017-04-04 MED ORDER — SODIUM CHLORIDE 0.9 % IV SOLN
Freq: Once | INTRAVENOUS | Status: AC
  Administered 2017-04-04: 11:00:00 via INTRAVENOUS

## 2017-04-04 MED ORDER — HYDROMORPHONE HCL 4 MG/ML IJ SOLN
2.0000 mg | Freq: Once | INTRAMUSCULAR | Status: AC
  Administered 2017-04-04: 2 mg via INTRAVENOUS
  Filled 2017-04-04: qty 1

## 2017-04-04 MED ORDER — CIPROFLOXACIN HCL 500 MG PO TABS: 500.0000 mg | ORAL_TABLET | Freq: Two times a day (BID) | ORAL | 0 refills | Status: DC

## 2017-04-04 MED FILL — CIPROFLOXACIN HCL 500 MG TA: 500 | 7 days supply | Qty: 14 | Fill #0

## 2017-04-04 MED FILL — HYDROmorphone HCL 4 MG TABS: 4 | 6 days supply | Qty: 60 | Fill #0

## 2017-04-04 NOTE — Assessment & Plan Note (Signed)
She has severe abdominal pain, could be due to acute diverticulitis Previously, she responded well to antibiotic therapy We discussed options, conservative approach versus admission The patient does not wants to be admitted Her vitals are stable I recommend 1 dose of G-CSF along with oral antibiotic treatment and she agreed to proceed I also recommend Dilaudid as needed for pain management and she agreed

## 2017-04-04 NOTE — Telephone Encounter (Signed)
Scheduled appt per 11/8 los - patietn is aware of appt and my chart active - did not want any print outs

## 2017-04-04 NOTE — Progress Notes (Signed)
Pt in [facility] for IV fluids and IV pain meds. Pain is 10/10  In back, abd, thighs. Received IV dilaudid and pain @ 0/10 after 15 minutes and remained that way throughout rest of visit. Dr. Alvy Bimler in to see pt. Pt able to go home on Cipro and dilaudid po.  Pt in better spirits and w/o pain at time of discharge.

## 2017-04-04 NOTE — Patient Instructions (Signed)
Dehydration, Adult Dehydration is a condition in which there is not enough fluid or water in the body. This happens when you lose more fluids than you take in. Important organs, such as the kidneys, brain, and heart, cannot function without a proper amount of fluids. Any loss of fluids from the body can lead to dehydration. Dehydration can range from mild to severe. This condition should be treated right away to prevent it from becoming severe. What are the causes? This condition may be caused by:  Vomiting.  Diarrhea.  Excessive sweating, such as from heat exposure or exercise.  Not drinking enough fluid, especially: ? When ill. ? While doing activity that requires a lot of energy.  Excessive urination.  Fever.  Infection.  Certain medicines, such as medicines that cause the body to lose excess fluid (diuretics).  Inability to access safe drinking water.  Reduced physical ability to get adequate water and food.  What increases the risk? This condition is more likely to develop in people:  Who have a poorly controlled long-term (chronic) illness, such as diabetes, heart disease, or kidney disease.  Who are age 65 or older.  Who are disabled.  Who live in a place with high altitude.  Who play endurance sports.  What are the signs or symptoms? Symptoms of mild dehydration may include:  Thirst.  Dry lips.  Slightly dry mouth.  Dry, warm skin.  Dizziness. Symptoms of moderate dehydration may include:  Very dry mouth.  Muscle cramps.  Dark urine. Urine may be the color of tea.  Decreased urine production.  Decreased tear production.  Heartbeat that is irregular or faster than normal (palpitations).  Headache.  Light-headedness, especially when you stand up from a sitting position.  Fainting (syncope). Symptoms of severe dehydration may include:  Changes in skin, such as: ? Cold and clammy skin. ? Blotchy (mottled) or pale skin. ? Skin that does  not quickly return to normal after being lightly pinched and released (poor skin turgor).  Changes in body fluids, such as: ? Extreme thirst. ? No tear production. ? Inability to sweat when body temperature is high, such as in hot weather. ? Very little urine production.  Changes in vital signs, such as: ? Weak pulse. ? Pulse that is more than 100 beats a minute when sitting still. ? Rapid breathing. ? Low blood pressure.  Other changes, such as: ? Sunken eyes. ? Cold hands and feet. ? Confusion. ? Lack of energy (lethargy). ? Difficulty waking up from sleep. ? Short-term weight loss. ? Unconsciousness. How is this diagnosed? This condition is diagnosed based on your symptoms and a physical exam. Blood and urine tests may be done to help confirm the diagnosis. How is this treated? Treatment for this condition depends on the severity. Mild or moderate dehydration can often be treated at home. Treatment should be started right away. Do not wait until dehydration becomes severe. Severe dehydration is an emergency and it needs to be treated in a hospital. Treatment for mild dehydration may include:  Drinking more fluids.  Replacing salts and minerals in your blood (electrolytes) that you may have lost. Treatment for moderate dehydration may include:  Drinking an oral rehydration solution (ORS). This is a drink that helps you replace fluids and electrolytes (rehydrate). It can be found at pharmacies and retail stores. Treatment for severe dehydration may include:  Receiving fluids through an IV tube.  Receiving an electrolyte solution through a feeding tube that is passed through your nose   and into your stomach (nasogastric tube, or NG tube).  Correcting any abnormalities in electrolytes.  Treating the underlying cause of dehydration. Follow these instructions at home:  If directed by your health care provider, drink an ORS: ? Make an ORS by following instructions on the  package. ? Start by drinking small amounts, about  cup (120 mL) every 5-10 minutes. ? Slowly increase how much you drink until you have taken the amount recommended by your health care provider.  Drink enough clear fluid to keep your urine clear or pale yellow. If you were told to drink an ORS, finish the ORS first, then start slowly drinking other clear fluids. Drink fluids such as: ? Water. Do not drink only water. Doing that can lead to having too little salt (sodium) in the body (hyponatremia). ? Ice chips. ? Fruit juice that you have added water to (diluted fruit juice). ? Low-calorie sports drinks.  Avoid: ? Alcohol. ? Drinks that contain a lot of sugar. These include high-calorie sports drinks, fruit juice that is not diluted, and soda. ? Caffeine. ? Foods that are greasy or contain a lot of fat or sugar.  Take over-the-counter and prescription medicines only as told by your health care provider.  Do not take sodium tablets. This can lead to having too much sodium in the body (hypernatremia).  Eat foods that contain a healthy balance of electrolytes, such as bananas, oranges, potatoes, tomatoes, and spinach.  Keep all follow-up visits as told by your health care provider. This is important. Contact a health care provider if:  You have abdominal pain that: ? Gets worse. ? Stays in one area (localizes).  You have a rash.  You have a stiff neck.  You are more irritable than usual.  You are sleepier or more difficult to wake up than usual.  You feel weak or dizzy.  You feel very thirsty.  You have urinated only a small amount of very dark urine over 6-8 hours. Get help right away if:  You have symptoms of severe dehydration.  You cannot drink fluids without vomiting.  Your symptoms get worse with treatment.  You have a fever.  You have a severe headache.  You have vomiting or diarrhea that: ? Gets worse. ? Does not go away.  You have blood or green matter  (bile) in your vomit.  You have blood in your stool. This may cause stool to look black and tarry.  You have not urinated in 6-8 hours.  You faint.  Your heart rate while sitting still is over 100 beats a minute.  You have trouble breathing. This information is not intended to replace advice given to you by your health care provider. Make sure you discuss any questions you have with your health care provider. Document Released: 05/14/2005 Document Revised: 12/09/2015 Document Reviewed: 07/08/2015 Elsevier Interactive Patient Education  2018 [employer]  

## 2017-04-04 NOTE — Assessment & Plan Note (Signed)
She appears clinically dehydrated She felt better after IV fluid hydration I encouraged her to increase oral intake as tolerated We discussed admission but the patient feel comfortable to be discharged and treated at home I informed her to call tomorrow if she felt she needs more IV fluids.

## 2017-04-04 NOTE — Assessment & Plan Note (Signed)
She has completed recent chemotherapy Continue aggressive supportive care She has appointment to see GYN oncologist next month

## 2017-04-04 NOTE — Assessment & Plan Note (Signed)
The patient is at high risk of infection I recommend 1 dose of Granix along with antibiotic therapy

## 2017-04-04 NOTE — Progress Notes (Signed)
[facility] OFFICE PROGRESS NOTE  Patient Care Team: Patient, No Pcp Per as PCP - General (General Practice)  SUMMARY OF ONCOLOGIC HISTORY: Oncology History   MSI stable, Her 2 neg, ER patchy positivity     Endometrial cancer ([place])   04/05/2015 Imaging    CT abdomen:  1. Large bilateral hydrosalpinx/pyosalpinx. 2. Enlarged fibroid uterus. 3. Mild bilateral hydronephrosis from #1.       04/06/2015 Procedure    Status post CT-guided drainage of right and left tubo-ovarian abscess, with parallel drains from a right trans gluteal approach. Sample was sent from both the left and the right fluid collections to the lab.      04/19/2015 Imaging    CT abdomen and pelvis: The bilateral pelvic fluid collections have resolved following placement of the percutaneous drainage catheters. Small amount of fluid in the pelvis but no evidence for residual abscess collections or hydrosalpinx. Fibroid uterus. Innumerable uterine fibroids. Many of these fibroids are pedunculated. Development of a small left pleural effusion. Mild fat stranding in the abdominal and pelvic mesentery suggestive for mild edema      05/03/2015 Imaging    US abdomen and pelvis 1. Significantly regressed but not completely resolved bilateral hydrosalpinx/pyosalpinx. Residual is greater on the right (18 mm diameter) with mild fluid complexity. 2. Stable fibroid uterus. 3. No pelvic free fluid      07/06/2015 Pathology Results    PAP smear NEGATIVE FOR INTRAEPITHELIAL LESIONS OR MALIGNANCY.      07/18/2015 Pathology Results    Vulva, biopsy, mass - BENIGN EPIDERMAL CYST. - NO EVIDENCE OF MALIGNANCY      10/21/2016 Initial Diagnosis    She presented to the ER for severe abdominal pain      10/22/2016 Imaging    CT abdomen and pelvis 1. 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 2. Complex partially cystic mass at the left  adnexa, measuring 5.9 x 5.5 x 4.3 cm. This raises concern for primary ovarian malignancy. PET/CT could be considered for further evaluation. 3. Numerous uterine fibroids again noted, some which have increased in size. Underlying endometrial malignancy cannot be excluded. 4. Dilated bilateral fluid collections have reaccumulated within the pelvis, the largest of which measures 15 cm in length. 5. Enlarged bilateral pelvic sidewall nodes measure up to 1.6 cm in short axis, compatible with metastatic disease. Mildly prominent retroperitoneal nodes seen. Mildly prominent nodes about the left inguinal vessels. 6. 1.6 cm cyst at the medial right hepatic lobe.       10/29/2016 - 11/02/2016 Hospital Admission    She was admitted to [facility] for management of newly diagnosed endometrial cancer      10/29/2016 Tumor Marker    Patient's tumor was tested for the following markers: CA125 Results of the tumor marker test revealed 740      10/30/2016 Surgery    Procedure(s): - Open ureteral exploration - Cystourethroscopy - Left ureteral stent placement  Operative Findings:   1.) Normal cystourethrosocpy without lesions, masses or stones. Bilateral ureteral orifices identified with efflux of clear yellow urine 2.) Successful placement of left ureteral stent under visualization with appropriate curl visualized within the bladder and stent palpable within the ureter to the level of the renal pelvis 3) Open exploration determined ureter to be intact and viable.      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) - Carcinoma involves outer half of myometrium, serosal surface, and cervix - Bilateral ovaries / adnexa are positive for high grade serous carcinoma - Extensive lymphovascular space invasion is present - See  synoptic report and comment  B: "Free floating abdominal mass", removal  - Benign cyst with mesothelial-type lining, consistent with peritoneal inclusion cyst, size 7.0 cm - No malignancy identified   C: Posterior cul-de-sac mass, excision  - Involved by high grade serous carcinoma  D: Lymph nodes, left pelvic, resection  - Five of six lymph nodes involved by metastatic high grade serous carcinoma, size of metastases up to 1.5 cm, with no definite extracapsular extension identified (5/6)  E: Lymph nodes, right pelvic, excision  - Four of four lymph nodes involved by metastatic high grade serous carcinoma, size of metastases up to 1.6 cm, with no definite extracapsular extension identified (4/4)  F: Lymph nodes, right para-aortic, resection  - Three of four lymph nodes involved by metastatic high grade serous carcinoma, size of metastases up to 1.7 cm, with no definite extracapsular extension identified (3/4)  G: Omentum, omentectomy  - Involved by metastatic high grade serous carcinoma, size of nodules up to 2.9 cm   Given the presence of a large endometrial mass with serous endometrial intraepithelial carcinoma as well as deep myometrial invasion and cervical and serosal involvement, the tumor is staged as an endometrial primary. However, ovarian primary with spread to the uterus or synchronous primary tumors are also a diagnostic possibility, and definitive site of origin cannot be determined histologically. Extensive lymphovascular space invasion is present, and the tumor involves both ovaries, peritoneum, omentum, and multiple lymph nodes. No distinct fallopian tubes are identified grossly or microscopically; however, they may have been incorporated into the ovarian / adnexal tumor masses.  Immunohistochemical stains are performed on block A11 and demonstrate the tumor is positive for p53 and WT1 and is strongly diffusely positive for p16. It shows patchy focal staining for ER. WT1  staining is also positive in block A15, the right ovary. 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 Interval hysterectomy and likely oophorectomy, and possibly partial omentectomy. There is infiltration and nodularity of the upper omentum compatible with tumor along with complex density along the paracolic gutters, space of Retzius, and presacral space which may also represent tumor infiltration. 2. Soft tissue density favoring tumor along the right posterior piriformis muscle extending towards the vaginal cuff and cul-de-sac region. This likely represents tumor. 3. Stable or minimally increased retroperitoneal and pelvic adenopathy. This is thought to be malignant. 4. Reduced ascites. 5. Mild bilateral hydronephrosis and proximal hydroureter extending down to the iliac vessel cross over is were there is stranding and some mild adenopathy. This likely represents low-grade partial ureteral obstruction and is most likely from extrinsic mass effect. 6. Aortic Atherosclerosis (ICD10-I70.0) and Emphysema (ICD10-J43.9). 7. Presacral and perirectal tumor nodularity.      11/20/2016 Tumor Marker    Patient's tumor was tested for the following markers: CA-125. Results of the tumor marker test revealed 154.2.      11/29/2016 Genetic Testing    Patient has genetic testing done for MSI instability Results revealed patient has no MSI instability      11/30/2016 -  Chemotherapy    She received carboplatin & Taxol  01/04/2017 Genetic Testing    No mutations were identified in 83 gene analyzed for hereditary cancer risk. VUSs were noted in ATM and GPC3.       01/10/2017 Tumor Marker    Patient's tumor was tested for the following markers: CA125 Results of the tumor marker test revealed 23.5      01/30/2017 Imaging    1. Moderate to marked response to therapy. 2. Resolution of abdominopelvic  adenopathy. 3. Resolution of omental nodularity. Significantly improved peritoneal disease within the pelvis. 4. Improvement in trace right-sided caliectasis and proximal right hydroureter. No well-defined obstructive mass. 5. Age advanced aortic atherosclerosis. 6. Mild bladder wall thickening could be due to under distention and/or cystitis.       INTERVAL HISTORY: Please see below for problem oriented charting. She is seen urgently per patient request She started to develop progressive, severe abdominal pain in the lower left and suprapubic area since 3 days ago She rated her pain as 10 out of 10 Previously, she had a flare of acute diverticulitis, improved with antibiotics She took some oxycodone and did not control her pain She also have diffuse musculoskeletal pain Her intake is very poor and she felt dehydrated She denies nausea or vomiting Last bowel movement was 2 days ago with normal appearance She denies fever or chills  REVIEW OF SYSTEMS:   Constitutional: Denies fevers, chills or abnormal weight loss Eyes: Denies blurriness of vision Ears, nose, mouth, throat, and face: Denies mucositis or sore throat Respiratory: Denies cough, dyspnea or wheezes Cardiovascular: Denies palpitation, chest discomfort or lower extremity swelling Gastrointestinal:  Denies nausea, heartburn or change in bowel habits Skin: Denies abnormal skin rashes Lymphatics: Denies new lymphadenopathy or easy bruising Neurological:Denies numbness, tingling or new weaknesses Behavioral/Psych: Mood is stable, no new changes  All other systems were reviewed with the patient and are negative.  I have reviewed the past medical history, past surgical history, social history and family history with the patient and they are unchanged from previous note.  ALLERGIES:  has No Known Allergies.  MEDICATIONS:  Current Outpatient Medications  Medication Sig Dispense Refill  . acetaminophen (TYLENOL) 325 MG  tablet Take 650 mg by mouth every 6 (six) hours as needed.    . ALPRAZolam (XANAX) 0.5 MG tablet Take 0.5 mg by mouth 3 (three) times daily as needed for anxiety.    . ciprofloxacin (CIPRO) 500 MG tablet Take 1 tablet (500 mg total) by mouth 2 (two) times daily. 14 tablet 0  . ciprofloxacin (CIPRO) 500 MG tablet Take 1 tablet (500 mg total) 2 (two) times daily by mouth. 14 tablet 0  . dexamethasone (DECADRON) 4 MG tablet Take 5 tablets at midnight and 5 tablets at 6 am the morning of treatment, by mouth, every 21 days 30 tablet 1  . HYDROmorphone (DILAUDID) 4 MG tablet Take 1.5 tablets (6 mg total) every 4 (four) hours as needed by mouth for severe pain. 60 tablet 0  . ondansetron (ZOFRAN) 8 MG tablet Take 1 tablet (8 mg total) by mouth 2 (two) times daily as needed for refractory nausea / vomiting. Start on day 3 after chemo. 30 tablet 1  . oxyCODONE (OXY IR/ROXICODONE) 5 MG immediate release tablet Take 1 tablet (5 mg total) by mouth every 4 (four) hours as needed for severe pain. 60 tablet 0  . oxyCODONE (OXY IR/ROXICODONE) 5 MG immediate release tablet Take 1 tablet (5 mg total) by mouth every 4 (four) hours as needed for severe pain. [place]  tablet 0  . prochlorperazine (COMPAZINE) 10 MG tablet Take 1 tablet (10 mg total) by mouth every 6 (six) hours as needed (Nausea or vomiting). 30 tablet 1  . simethicone (MYLICON) 80 MG chewable tablet Chew 80 mg by mouth 4 (four) times daily as needed.     No current facility-administered medications for this visit.     PHYSICAL EXAMINATION: ECOG PERFORMANCE STATUS: 2 - Symptomatic, <50% confined to bed  Vitals:   04/04/17 0919  BP: 122/90  Pulse: (!) 113  Resp: 20  Temp: 98.1 F (36.7 C)   Filed Weights   04/04/17 0919  Weight: 111 lb 1.6 oz (50.4 kg)    GENERAL:alert, no distress and comfortable SKIN: skin color, texture, turgor are normal, no rashes or significant lesions EYES: normal, Conjunctiva are pink and non-injected, sclera  clear OROPHARYNX:no exudate, no erythema and lips, buccal mucosa, and tongue normal  NECK: supple, thyroid normal size, non-tender, without nodularity LYMPH:  no palpable lymphadenopathy in the cervical, axillary or inguinal LUNGS: clear to auscultation and percussion with normal breathing effort HEART: regular rate & rhythm and no murmurs and no lower extremity edema ABDOMEN:abdomen soft, diffuse tenderness in the left lower and suprapubic region without rebound or guarding Musculoskeletal:no cyanosis of digits and no clubbing  NEURO: alert & oriented x 3 with fluent speech, no focal motor/sensory deficits  After IV fluids resuscitation, she felt better.  She received 2 mg of IV Dilaudid and her pain went from 10 out of 10 to 0 out of 10  LABORATORY DATA:  I have reviewed the data as listed    Component Value Date/Time   NA 138 04/04/2017 0949   K 4.1 04/04/2017 0949   CL 101 10/22/2016 2224   CO2 26 04/04/2017 0949   GLUCOSE 117 04/04/2017 0949   BUN 9.0 04/04/2017 0949   CREATININE 0.7 04/04/2017 0949   CALCIUM 10.4 04/04/2017 0949   PROT 8.1 04/04/2017 0949   ALBUMIN 4.4 04/04/2017 0949   AST 21 04/04/2017 0949   ALT 17 04/04/2017 0949   ALKPHOS 79 04/04/2017 0949   BILITOT 0.27 04/04/2017 0949   GFRNONAA >60 10/22/2016 2224   GFRAA >60 10/22/2016 2224    No results found for: SPEP, UPEP  Lab Results  Component Value Date   WBC 2.8 (L) 04/04/2017   NEUTROABS 0.5 (LL) 04/04/2017   HGB 13.1 04/04/2017   HCT 38.4 04/04/2017   MCV 100.6 04/04/2017   PLT 223 04/04/2017      Chemistry      Component Value Date/Time   NA 138 04/04/2017 0949   K 4.1 04/04/2017 0949   CL 101 10/22/2016 2224   CO2 26 04/04/2017 0949   BUN 9.0 04/04/2017 0949   CREATININE 0.7 04/04/2017 0949      Component Value Date/Time   CALCIUM 10.4 04/04/2017 0949   ALKPHOS 79 04/04/2017 0949   AST 21 04/04/2017 0949   ALT 17 04/04/2017 0949   BILITOT 0.27 04/04/2017 0949        RADIOGRAPHIC STUDIES: I have personally reviewed the radiological images as listed and agreed with the findings in the report. Dg Abd 2 Views  Result Date: 03/12/2017 CLINICAL DATA:  Abdominal pain. History of endometrial cancer, currently on chemotherapy. EXAM: ABDOMEN - 2 VIEW COMPARISON:  CT abdomen pelvis dated January 30, 2017. FINDINGS: The bowel gas pattern is normal. There is no evidence of free air. No radio-opaque calculi or other significant radiographic abnormality is seen. Surgical clips in the  left pelvis. IMPRESSION: Negative. Electronically Signed   By: Titus Dubin M.D.   On: 03/12/2017 09:56    ASSESSMENT & PLAN:  Endometrial cancer [facility]) She has completed recent chemotherapy Continue aggressive supportive care She has appointment to see GYN oncologist next month  Leukopenia due to antineoplastic chemotherapy [facility]) The patient is at high risk of infection I recommend 1 dose of Granix along with antibiotic therapy  Abdominal pain She has severe abdominal pain, could be due to acute diverticulitis Previously, she responded well to antibiotic therapy We discussed options, conservative approach versus admission The patient does not wants to be admitted Her vitals are stable I recommend 1 dose of G-CSF along with oral antibiotic treatment and she agreed to proceed I also recommend Dilaudid as needed for pain management and she agreed  Dehydration She appears clinically dehydrated She felt better after IV fluid hydration I encouraged her to increase oral intake as tolerated We discussed admission but the patient feel comfortable to be discharged and treated at home I informed her to call tomorrow if she felt she needs more IV fluids.   No orders of the defined types were placed in this encounter.  All questions were answered. The patient knows to call the clinic with any problems, questions or concerns. No barriers to learning was detected. I spent 30  minutes counseling the patient face to face. The total time spent in the appointment was 40 minutes and more than 50% was on counseling and review of test results     Heath Lark, MD 04/04/2017 3:23 PM

## 2017-04-10 ENCOUNTER — Other Ambulatory Visit (HOSPITAL_BASED_OUTPATIENT_CLINIC_OR_DEPARTMENT_OTHER): Payer: Self-pay

## 2017-04-10 ENCOUNTER — Ambulatory Visit (HOSPITAL_BASED_OUTPATIENT_CLINIC_OR_DEPARTMENT_OTHER): Payer: Self-pay | Admitting: Hematology and Oncology

## 2017-04-10 ENCOUNTER — Encounter: Payer: Self-pay | Admitting: Hematology and Oncology

## 2017-04-10 DIAGNOSIS — D701 Agranulocytosis secondary to cancer chemotherapy: Secondary | ICD-10-CM

## 2017-04-10 DIAGNOSIS — R1030 Lower abdominal pain, unspecified: Secondary | ICD-10-CM

## 2017-04-10 DIAGNOSIS — C541 Malignant neoplasm of endometrium: Secondary | ICD-10-CM

## 2017-04-10 DIAGNOSIS — T451X5A Adverse effect of antineoplastic and immunosuppressive drugs, initial encounter: Secondary | ICD-10-CM

## 2017-04-10 LAB — CBC WITH DIFFERENTIAL/PLATELET
BASO%: 0.3 % (ref 0.0–2.0)
Basophils Absolute: 0 10*3/uL (ref 0.0–0.1)
EOS%: 0.6 % (ref 0.0–7.0)
Eosinophils Absolute: 0 10*3/uL (ref 0.0–0.5)
HCT: 37.6 % (ref 34.8–46.6)
HEMOGLOBIN: 12.6 g/dL (ref 11.6–15.9)
LYMPH#: 1.7 10*3/uL (ref 0.9–3.3)
LYMPH%: 49 % (ref 14.0–49.7)
MCH: 34.1 pg — ABNORMAL HIGH (ref 25.1–34.0)
MCHC: 33.5 g/dL (ref 31.5–36.0)
MCV: 101.6 fL — ABNORMAL HIGH (ref 79.5–101.0)
MONO#: 0.4 10*3/uL (ref 0.1–0.9)
MONO%: 11.9 % (ref 0.0–14.0)
NEUT%: 38.2 % — ABNORMAL LOW (ref 38.4–76.8)
NEUTROS ABS: 1.4 10*3/uL — AB (ref 1.5–6.5)
Platelets: 135 10*3/uL — ABNORMAL LOW (ref 145–400)
RBC: 3.7 10*6/uL (ref 3.70–5.45)
RDW: 14.2 % (ref 11.2–14.5)
WBC: 3.5 10*3/uL — AB (ref 3.9–10.3)

## 2017-04-10 LAB — COMPREHENSIVE METABOLIC PANEL
ALBUMIN: 4 g/dL (ref 3.5–5.0)
ALK PHOS: 74 U/L (ref 40–150)
ALT: 13 U/L (ref 0–55)
AST: 19 U/L (ref 5–34)
Anion Gap: 11 mEq/L (ref 3–11)
BILIRUBIN TOTAL: 0.24 mg/dL (ref 0.20–1.20)
BUN: 6.6 mg/dL — AB (ref 7.0–26.0)
CO2: 28 mEq/L (ref 22–29)
CREATININE: 0.8 mg/dL (ref 0.6–1.1)
Calcium: 10.2 mg/dL (ref 8.4–10.4)
Chloride: 102 mEq/L (ref 98–109)
EGFR: >60 mL/min/{1.73_m2} (ref >60)
GLUCOSE: 108 mg/dL (ref 70–140)
POTASSIUM: 4 meq/L (ref 3.5–5.1)
SODIUM: 141 meq/L (ref 136–145)
TOTAL PROTEIN: 7.9 g/dL (ref 6.4–8.3)

## 2017-04-10 NOTE — Assessment & Plan Note (Signed)
Leukopenia is resolving Observe only

## 2017-04-10 NOTE — Assessment & Plan Note (Signed)
The patient has completed all adjuvant treatment CT scan and follow-up with GYN oncologist has been arranged for next month I would defer to them for further follow-up I have not made any future appointment to see her back

## 2017-04-10 NOTE — Progress Notes (Signed)
[facility] OFFICE PROGRESS NOTE  Patient Care Team: Patient, No Pcp Per as PCP - General (General Practice)  SUMMARY OF ONCOLOGIC HISTORY: Oncology History   MSI stable, Her 2 neg, ER patchy positivity     Endometrial cancer ([place])   04/05/2015 Imaging    CT abdomen:  1. Large bilateral hydrosalpinx/pyosalpinx. 2. Enlarged fibroid uterus. 3. Mild bilateral hydronephrosis from #1.       04/06/2015 Procedure    Status post CT-guided drainage of right and left tubo-ovarian abscess, with parallel drains from a right trans gluteal approach. Sample was sent from both the left and the right fluid collections to the lab.      04/19/2015 Imaging    CT abdomen and pelvis: The bilateral pelvic fluid collections have resolved following placement of the percutaneous drainage catheters. Small amount of fluid in the pelvis but no evidence for residual abscess collections or hydrosalpinx. Fibroid uterus. Innumerable uterine fibroids. Many of these fibroids are pedunculated. Development of a small left pleural effusion. Mild fat stranding in the abdominal and pelvic mesentery suggestive for mild edema      05/03/2015 Imaging    US abdomen and pelvis 1. Significantly regressed but not completely resolved bilateral hydrosalpinx/pyosalpinx. Residual is greater on the right (18 mm diameter) with mild fluid complexity. 2. Stable fibroid uterus. 3. No pelvic free fluid      07/06/2015 Pathology Results    PAP smear NEGATIVE FOR INTRAEPITHELIAL LESIONS OR MALIGNANCY.      07/18/2015 Pathology Results    Vulva, biopsy, mass - BENIGN EPIDERMAL CYST. - NO EVIDENCE OF MALIGNANCY      10/21/2016 Initial Diagnosis    She presented to the ER for severe abdominal pain      10/22/2016 Imaging    CT abdomen and pelvis 1. 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 2. Complex partially cystic mass at the left  adnexa, measuring 5.9 x 5.5 x 4.3 cm. This raises concern for primary ovarian malignancy. PET/CT could be considered for further evaluation. 3. Numerous uterine fibroids again noted, some which have increased in size. Underlying endometrial malignancy cannot be excluded. 4. Dilated bilateral fluid collections have reaccumulated within the pelvis, the largest of which measures 15 cm in length. 5. Enlarged bilateral pelvic sidewall nodes measure up to 1.6 cm in short axis, compatible with metastatic disease. Mildly prominent retroperitoneal nodes seen. Mildly prominent nodes about the left inguinal vessels. 6. 1.6 cm cyst at the medial right hepatic lobe.       10/29/2016 - 11/02/2016 Hospital Admission    She was admitted to [facility] for management of newly diagnosed endometrial cancer      10/29/2016 Tumor Marker    Patient's tumor was tested for the following markers: CA125 Results of the tumor marker test revealed 740      10/30/2016 Surgery    Procedure(s): - Open ureteral exploration - Cystourethroscopy - Left ureteral stent placement  Operative Findings:   1.) Normal cystourethrosocpy without lesions, masses or stones. Bilateral ureteral orifices identified with efflux of clear yellow urine 2.) Successful placement of left ureteral stent under visualization with appropriate curl visualized within the bladder and stent palpable within the ureter to the level of the renal pelvis 3) Open exploration determined ureter to be intact and viable.      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) - Carcinoma involves outer half of myometrium, serosal surface, and cervix - Bilateral ovaries / adnexa are positive for high grade serous carcinoma - Extensive lymphovascular space invasion is present - See  synoptic report and comment  B: "Free floating abdominal mass", removal  - Benign cyst with mesothelial-type lining, consistent with peritoneal inclusion cyst, size 7.0 cm - No malignancy identified   C: Posterior cul-de-sac mass, excision  - Involved by high grade serous carcinoma  D: Lymph nodes, left pelvic, resection  - Five of six lymph nodes involved by metastatic high grade serous carcinoma, size of metastases up to 1.5 cm, with no definite extracapsular extension identified (5/6)  E: Lymph nodes, right pelvic, excision  - Four of four lymph nodes involved by metastatic high grade serous carcinoma, size of metastases up to 1.6 cm, with no definite extracapsular extension identified (4/4)  F: Lymph nodes, right para-aortic, resection  - Three of four lymph nodes involved by metastatic high grade serous carcinoma, size of metastases up to 1.7 cm, with no definite extracapsular extension identified (3/4)  G: Omentum, omentectomy  - Involved by metastatic high grade serous carcinoma, size of nodules up to 2.9 cm   Given the presence of a large endometrial mass with serous endometrial intraepithelial carcinoma as well as deep myometrial invasion and cervical and serosal involvement, the tumor is staged as an endometrial primary. However, ovarian primary with spread to the uterus or synchronous primary tumors are also a diagnostic possibility, and definitive site of origin cannot be determined histologically. Extensive lymphovascular space invasion is present, and the tumor involves both ovaries, peritoneum, omentum, and multiple lymph nodes. No distinct fallopian tubes are identified grossly or microscopically; however, they may have been incorporated into the ovarian / adnexal tumor masses.  Immunohistochemical stains are performed on block A11 and demonstrate the tumor is positive for p53 and WT1 and is strongly diffusely positive for p16. It shows patchy focal staining for ER. WT1  staining is also positive in block A15, the right ovary. 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 Interval hysterectomy and likely oophorectomy, and possibly partial omentectomy. There is infiltration and nodularity of the upper omentum compatible with tumor along with complex density along the paracolic gutters, space of Retzius, and presacral space which may also represent tumor infiltration. 2. Soft tissue density favoring tumor along the right posterior piriformis muscle extending towards the vaginal cuff and cul-de-sac region. This likely represents tumor. 3. Stable or minimally increased retroperitoneal and pelvic adenopathy. This is thought to be malignant. 4. Reduced ascites. 5. Mild bilateral hydronephrosis and proximal hydroureter extending down to the iliac vessel cross over is were there is stranding and some mild adenopathy. This likely represents low-grade partial ureteral obstruction and is most likely from extrinsic mass effect. 6. Aortic Atherosclerosis (ICD10-I70.0) and Emphysema (ICD10-J43.9). 7. Presacral and perirectal tumor nodularity.      11/20/2016 Tumor Marker    Patient's tumor was tested for the following markers: CA-125. Results of the tumor marker test revealed 154.2.      11/29/2016 Genetic Testing    Patient has genetic testing done for MSI instability Results revealed patient has no MSI instability      11/30/2016 -  Chemotherapy    She received carboplatin & Taxol  01/04/2017 Genetic Testing    No mutations were identified in 83 gene analyzed for hereditary cancer risk. VUSs were noted in ATM and GPC3.       01/10/2017 Tumor Marker    Patient's tumor was tested for the following markers: CA125 Results of the tumor marker test revealed 23.5      01/30/2017 Imaging    1. Moderate to marked response to therapy. 2. Resolution of abdominopelvic  adenopathy. 3. Resolution of omental nodularity. Significantly improved peritoneal disease within the pelvis. 4. Improvement in trace right-sided caliectasis and proximal right hydroureter. No well-defined obstructive mass. 5. Age advanced aortic atherosclerosis. 6. Mild bladder wall thickening could be due to under distention and/or cystitis.       INTERVAL HISTORY: Please see below for problem oriented charting. She returns for further follow-up Her abdominal pain has resolved She denies further nausea, vomiting or dehydration She started gaining weight Her pain has substantially improved and she only take a few pain medicine every day She have mild constipation alternate with diarrhea but overall much improved She denies recent fever or chills  REVIEW OF SYSTEMS:   Constitutional: Denies fevers, chills or abnormal weight loss Eyes: Denies blurriness of vision Ears, nose, mouth, throat, and face: Denies mucositis or sore throat Respiratory: Denies cough, dyspnea or wheezes Cardiovascular: Denies palpitation, chest discomfort or lower extremity swelling Skin: Denies abnormal skin rashes Lymphatics: Denies new lymphadenopathy or easy bruising Neurological:Denies numbness, tingling or new weaknesses Behavioral/Psych: Mood is stable, no new changes  All other systems were reviewed with the patient and are negative.  I have reviewed the past medical history, past surgical history, social history and family history with the patient and they are unchanged from previous note.  ALLERGIES:  has No Known Allergies.  MEDICATIONS:  Current Outpatient Medications  Medication Sig Dispense Refill  . acetaminophen (TYLENOL) 325 MG tablet Take 650 mg by mouth every 6 (six) hours as needed.    . ALPRAZolam (XANAX) 0.5 MG tablet Take 0.5 mg by mouth 3 (three) times daily as needed for anxiety.    Marland Kitchen HYDROmorphone (DILAUDID) 4 MG tablet Take 1.5 tablets (6 mg total) every 4 (four) hours as needed  by mouth for severe pain. 60 tablet 0  . ondansetron (ZOFRAN) 8 MG tablet Take 1 tablet (8 mg total) by mouth 2 (two) times daily as needed for refractory nausea / vomiting. Start on day 3 after chemo. 30 tablet 1  . oxyCODONE (OXY IR/ROXICODONE) 5 MG immediate release tablet Take 1 tablet (5 mg total) by mouth every 4 (four) hours as needed for severe pain. 60 tablet 0  . prochlorperazine (COMPAZINE) 10 MG tablet Take 1 tablet (10 mg total) by mouth every 6 (six) hours as needed (Nausea or vomiting). 30 tablet 1  . simethicone (MYLICON) 80 MG chewable tablet Chew 80 mg by mouth 4 (four) times daily as needed.     No current facility-administered medications for this visit.     PHYSICAL EXAMINATION: ECOG PERFORMANCE STATUS: 1 - Symptomatic but completely ambulatory  Vitals:   04/10/17 1116  BP: 113/71  Pulse: (!) 106  Resp: 18  Temp: 97.7 F (36.5 C)  SpO2: 100%   Filed Weights   04/10/17 1116  Weight: 117 lb 8 oz (53.3 kg)    GENERAL:alert, no distress and comfortable SKIN: skin color, texture, turgor are normal, no rashes or significant lesions EYES: normal, Conjunctiva are pink and non-injected, sclera clear Musculoskeletal:no cyanosis of digits and no clubbing  NEURO: alert & oriented x 3 with fluent speech, no focal motor/sensory deficits  LABORATORY DATA:  I have reviewed the data as listed    Component Value Date/Time   NA 141 04/10/2017 1101   K 4.0 04/10/2017 1101   CL 101 10/22/2016 2224   CO2 28 04/10/2017 1101   GLUCOSE 108 04/10/2017 1101   BUN 6.6 (L) 04/10/2017 1101   CREATININE 0.8 04/10/2017 1101   CALCIUM 10.2 04/10/2017 1101   PROT 7.9 04/10/2017 1101   ALBUMIN 4.0 04/10/2017 1101   AST 19 04/10/2017 1101   ALT 13 04/10/2017 1101   ALKPHOS 74 04/10/2017 1101   BILITOT 0.24 04/10/2017 1101   GFRNONAA >60 10/22/2016 2224   GFRAA >60 10/22/2016 2224    No results found for: SPEP, UPEP  Lab Results  Component Value Date   WBC 3.5 (L) 04/10/2017    NEUTROABS 1.4 (L) 04/10/2017   HGB 12.6 04/10/2017   HCT 37.6 04/10/2017   MCV 101.6 (H) 04/10/2017   PLT 135 (L) 04/10/2017      Chemistry      Component Value Date/Time   NA 141 04/10/2017 1101   K 4.0 04/10/2017 1101   CL 101 10/22/2016 2224   CO2 28 04/10/2017 1101   BUN 6.6 (L) 04/10/2017 1101   CREATININE 0.8 04/10/2017 1101      Component Value Date/Time   CALCIUM 10.2 04/10/2017 1101   ALKPHOS 74 04/10/2017 1101   AST 19 04/10/2017 1101   ALT 13 04/10/2017 1101   BILITOT 0.24 04/10/2017 1101       RADIOGRAPHIC STUDIES: I have personally reviewed the radiological images as listed and agreed with the findings in the report. Dg Abd 2 Views  Result Date: 03/12/2017 CLINICAL DATA:  Abdominal pain. History of endometrial cancer, currently on chemotherapy. EXAM: ABDOMEN - 2 VIEW COMPARISON:  CT abdomen pelvis dated January 30, 2017. FINDINGS: The bowel gas pattern is normal. There is no evidence of free air. No radio-opaque calculi or other significant radiographic abnormality is seen. Surgical clips in the left pelvis. IMPRESSION: Negative. Electronically Signed   By: Titus Dubin M.D.   On: 03/12/2017 09:56    ASSESSMENT & PLAN:  Endometrial cancer [facility]) The patient has completed all adjuvant treatment CT scan and follow-up with GYN oncologist has been arranged for next month I would defer to them for further follow-up I have not made any future appointment to see her back  Leukopenia due to antineoplastic chemotherapy ([place]) Leukopenia is resolving Observe only  Abdominal pain Her abdominal pain has resolved She is eating better and is gaining weight I suspect she might have recurrent acute diverticulitis After CT scan next month, I think it would be appropriate to refer her to GI for further follow-up and colonoscopy.  She agree with the plan I will call her next week after CT scan result is available   No orders of the defined types were placed in  this encounter.  All questions were answered. The patient knows to call the clinic with any problems, questions or concerns. No barriers to learning was detected. I spent 10 minutes counseling the patient face to face. The total time spent in the appointment was 15 minutes and more than 50% was on counseling and review of test results     Heath Lark, MD 04/10/2017 11:52 AM

## 2017-04-10 NOTE — Assessment & Plan Note (Signed)
Her abdominal pain has resolved She is eating better and is gaining weight I suspect she might have recurrent acute diverticulitis After CT scan next month, I think it would be appropriate to refer her to GI for further follow-up and colonoscopy.  She agree with the plan I will call her next week after CT scan result is available

## 2017-04-12 ENCOUNTER — Other Ambulatory Visit: Payer: Self-pay

## 2017-04-12 ENCOUNTER — Emergency Department (HOSPITAL_COMMUNITY)
Admission: EM | Admit: 2017-04-12 | Discharge: 2017-04-12 | Disposition: A | Discharge status: Home / Self Care | Payer: Self-pay | Attending: Emergency Medicine | Admitting: Emergency Medicine

## 2017-04-12 ENCOUNTER — Telehealth: Payer: Self-pay | Admitting: *Deleted

## 2017-04-12 ENCOUNTER — Emergency Department (HOSPITAL_COMMUNITY): Payer: Self-pay

## 2017-04-12 ENCOUNTER — Telehealth: Payer: Self-pay | Admitting: Hematology and Oncology

## 2017-04-12 ENCOUNTER — Encounter (HOSPITAL_COMMUNITY): Payer: Self-pay | Admitting: Emergency Medicine

## 2017-04-12 DIAGNOSIS — Z8544 Personal history of malignant neoplasm of other female genital organs: Secondary | ICD-10-CM | POA: Insufficient documentation

## 2017-04-12 DIAGNOSIS — Z87891 Personal history of nicotine dependence: Secondary | ICD-10-CM | POA: Insufficient documentation

## 2017-04-12 DIAGNOSIS — C799 Secondary malignant neoplasm of unspecified site: Secondary | ICD-10-CM

## 2017-04-12 DIAGNOSIS — M79606 Pain in leg, unspecified: Secondary | ICD-10-CM | POA: Insufficient documentation

## 2017-04-12 DIAGNOSIS — R110 Nausea: Secondary | ICD-10-CM | POA: Insufficient documentation

## 2017-04-12 DIAGNOSIS — Z79899 Other long term (current) drug therapy: Secondary | ICD-10-CM | POA: Insufficient documentation

## 2017-04-12 HISTORY — DX: Malignant (primary) neoplasm, unspecified: C80.1

## 2017-04-12 LAB — CBC WITH DIFFERENTIAL/PLATELET
BASOS ABS: 0 10*3/uL (ref 0.0–0.1)
BASOS PCT: 0 %
EOS PCT: 0 %
Eosinophils Absolute: 0 10*3/uL (ref 0.0–0.7)
HCT: 33.4 % — ABNORMAL LOW (ref 36.0–46.0)
Hemoglobin: 11.6 g/dL — ABNORMAL LOW (ref 12.0–15.0)
Lymphocytes Relative: 33 %
Lymphs Abs: 1.5 10*3/uL (ref 0.7–4.0)
MCH: 34.2 pg — ABNORMAL HIGH (ref 26.0–34.0)
MCHC: 34.7 g/dL (ref 30.0–36.0)
MCV: 98.5 fL (ref 78.0–100.0)
MONO ABS: 0.4 10*3/uL (ref 0.1–1.0)
Monocytes Relative: 9 %
Neutro Abs: 2.6 10*3/uL (ref 1.7–7.7)
Neutrophils Relative %: 58 %
PLATELETS: 148 10*3/uL — AB (ref 150–400)
RBC: 3.39 MIL/uL — ABNORMAL LOW (ref 3.87–5.11)
RDW: 14.1 % (ref 11.5–15.5)
WBC: 4.5 10*3/uL (ref 4.0–10.5)

## 2017-04-12 LAB — URINALYSIS, ROUTINE W REFLEX MICROSCOPIC
BILIRUBIN URINE: NEGATIVE
GLUCOSE, UA: NEGATIVE mg/dL
Hgb urine dipstick: NEGATIVE
KETONES UR: NEGATIVE mg/dL
LEUKOCYTES UA: NEGATIVE
NITRITE: NEGATIVE
PROTEIN: NEGATIVE mg/dL
Specific Gravity, Urine: 1.008 (ref 1.005–1.030)
pH: 6 (ref 5.0–8.0)

## 2017-04-12 LAB — COMPREHENSIVE METABOLIC PANEL
ALK PHOS: 72 U/L (ref 38–126)
ALT: 18 U/L (ref 14–54)
AST: 20 U/L (ref 15–41)
Albumin: 4.1 g/dL (ref 3.5–5.0)
Anion gap: 8 (ref 5–15)
BUN: 12 mg/dL (ref 6–20)
CALCIUM: 9.4 mg/dL (ref 8.9–10.3)
CO2: 28 mmol/L (ref 22–32)
CREATININE: 0.5 mg/dL (ref 0.44–1.00)
Chloride: 99 mmol/L — ABNORMAL LOW (ref 101–111)
GFR calc Af Amer: >60 mL/min (ref >60)
Glucose, Bld: 104 mg/dL — ABNORMAL HIGH (ref 65–99)
Potassium: 3.5 mmol/L (ref 3.5–5.1)
Sodium: 135 mmol/L (ref 135–145)
TOTAL PROTEIN: 7.2 g/dL (ref 6.5–8.1)
Total Bilirubin: 0.4 mg/dL (ref 0.3–1.2)

## 2017-04-12 LAB — LIPASE, BLOOD: LIPASE: 16 U/L (ref 11–51)

## 2017-04-12 MED ORDER — IOPAMIDOL (ISOVUE-300) INJECTION 61%
30.0000 mL | Freq: Once | INTRAVENOUS | Status: AC | PRN
  Administered 2017-04-12: 30 mL via ORAL

## 2017-04-12 MED ORDER — ALPRAZOLAM 0.5 MG PO TABS: 0.5000 mg | ORAL_TABLET | Freq: Three times a day (TID) | ORAL | 0 refills | Status: DC | PRN

## 2017-04-12 MED ORDER — HYDROMORPHONE HCL 4 MG PO TABS: 6.0000 mg | ORAL_TABLET | ORAL | 0 refills | Status: DC | PRN

## 2017-04-12 MED ORDER — ONDANSETRON HCL 4 MG/2ML IJ SOLN
4.0000 mg | Freq: Once | INTRAMUSCULAR | Status: AC
Start: 2017-04-12 — End: 2017-04-12
  Administered 2017-04-12: 4 mg via INTRAVENOUS
  Filled 2017-04-12: qty 2

## 2017-04-12 MED ORDER — HYDROMORPHONE HCL 1 MG/ML IJ SOLN
0.5000 mg | INTRAMUSCULAR | Status: DC | PRN
  Administered 2017-04-12 (×2): 0.5 mg via INTRAVENOUS
  Filled 2017-04-12 (×2): qty 1

## 2017-04-12 MED ORDER — IOPAMIDOL (ISOVUE-300) INJECTION 61%
INTRAVENOUS | Status: AC
  Filled 2017-04-12: qty 100

## 2017-04-12 MED ORDER — SODIUM CHLORIDE 0.9 % IV SOLN
INTRAVENOUS | Status: DC
  Administered 2017-04-12: 12:00:00 via INTRAVENOUS

## 2017-04-12 MED ORDER — SODIUM CHLORIDE 0.9 % IV BOLUS (SEPSIS)
1000.0000 mL | Freq: Once | INTRAVENOUS | Status: AC
  Administered 2017-04-12: 1000 mL via INTRAVENOUS

## 2017-04-12 MED ORDER — IOPAMIDOL (ISOVUE-300) INJECTION 61%
100.0000 mL | Freq: Once | INTRAVENOUS | Status: AC | PRN
Start: 2017-04-12 — End: 2017-04-12
  Administered 2017-04-12: 80 mL via INTRAVENOUS

## 2017-04-12 MED ORDER — IOPAMIDOL (ISOVUE-300) INJECTION 61%
INTRAVENOUS | Status: AC
  Filled 2017-04-12: qty 30

## 2017-04-12 MED FILL — ALPRAZolam 0.5 MG TABS: 0.5 | 5 days supply | Qty: 15 | Fill #0

## 2017-04-12 MED FILL — HYDROmorphone HCL 4 MG TABS: 4 | 3 days supply | Qty: 30 | Fill #0

## 2017-04-12 NOTE — Telephone Encounter (Signed)
Scheduled appt per 11/16 sch msg - attempted to leave message for patient regarding these appts but their mailbox is full.

## 2017-04-12 NOTE — ED Triage Notes (Addendum)
Pt complaint of continued abdominal pain since last chemotherapy 10/22; recently finished treatment for "intestional infection." Denies n/v/d. Pt continues to verbalize "they said I might need a CAT scan and intravenous fluids."

## 2017-04-12 NOTE — ED Notes (Signed)
Patient ambulated to CT

## 2017-04-12 NOTE — Telephone Encounter (Signed)
Received walk in form from front desk, Kerry Morris.  Pt writes main complaint is severe pain.  Spoke with pt in lobby. She states 2 days ago when she saw Dr. Alvy Bimler she felt fine, had completed antibiotics for diverticulitis and was not having pain.  Today she states her pain has escalated to 10/10...abdominal pain primarily, lower pelvic area and radiates toward her back and down her legs.  She states she took dilaudid this am w/o relief. Spoke with Dr. Alvy Bimler who recommends she go directly to ED for evaluation/treatment.  Advised pt and her husband of what Dr. Alvy Bimler recommends. They are in agreement. Transported pt to ED via w/c. Pt given chemo alert card. Husband is with her.

## 2017-04-12 NOTE — ED Provider Notes (Signed)
Kerry Morris   CSN: [id number] Arrival date & time: 04/12/17  1008     History   Chief Complaint Chief Complaint  Patient presents with  . Cancer  . Abdominal Pain    HPI Kerry Morris is a 51 y.o. female.  HPI Pt complains of persistent abdominal pain as well as pain in her lower extremities.  She is getting treatment for endometrial chemo. Pt states she is having pain form the waist down.  Last chemo was on 10/22.  She has been having persistent trouble since then.  No vomiting, some nausea.  No diarrhea.  She had been constipated and had loose stools after milk of magnesia.  Pt saw her oncologist on 11/14.  Pt came to the ED because her pain is severe.  She was told to come to the ED to be evaluated and have a CT scan. Past Medical History:  Diagnosis Date  . Anxiety   . Anxiety and depression   . Cancer ([place])   . Depression   . Genetic testing 01/10/2017   Kerry Morris underwent genetic counseling and testing for hereditary cancer syndromes on 12/26/2016. Her results were negative for mutations in all 83 genes analyzed by [facility]'s 83-gene Multi-Cancers Panel. Genes analyzed include: ALK, APC, ATM, AXIN2, BAP1, BARD1, BLM, BMPR1A, BRCA1, BRCA2, BRIP1, CASR, CDC73, CDH1, CDK4, CDKN1B, CDKN1C, CDKN2A, CEBPA, CHEK2, CTNNA1, DICER1, DIS3L2, EGFR, EPCAM, F    Patient Active Problem List   Diagnosis Date Noted  . Abdominal pain 03/11/2017  . Goals of care, counseling/discussion 02/22/2017  . Arteriosclerosis 01/31/2017  . Genetic testing 01/10/2017  . Leukopenia due to antineoplastic chemotherapy ([place]) 12/20/2016  . Weight loss 11/08/2016  . Other constipation 11/08/2016  . Endometrial cancer ([place]) 11/07/2016    Past Surgical History:  Procedure Laterality Date  . CERVICAL BIOPSY  W/ LOOP ELECTRODE EXCISION    . DENTAL SURGERY      OB History    Gravida Para Term Preterm AB Living   0 0 0 0 0 0   SAB TAB Ectopic Multiple  Live Births   0 0 0 0         Home Medications    Prior to Admission medications   Medication Sig Start Date End Date Taking? Authorizing Provider  acetaminophen (TYLENOL) 325 MG tablet Take 650 mg by mouth every 6 (six) hours as needed. 11/02/16  Yes [provider]  dexamethasone (DECADRON) 4 MG tablet 4 mg. Take 5 tablets at midnight and 6 am morning of treatment every 21 days, patient completed treatment per Hassan Rowan at Dr. Calton Dach office 01/10/17  Yes [provider]  prochlorperazine (COMPAZINE) 10 MG tablet Take 1 tablet (10 mg total) by mouth every 6 (six) hours as needed (Nausea or vomiting). 11/20/16  Yes Heath Lark, MD  ALPRAZolam (XANAX) 0.5 MG tablet Take 1 tablet (0.5 mg total) 3 (three) times daily as needed by mouth for anxiety. 04/12/17   Dorie Rank, MD  HYDROmorphone (DILAUDID) 4 MG tablet Take 1.5 tablets (6 mg total) every 4 (four) hours as needed by mouth for severe pain. 04/12/17   Dorie Rank, MD  ondansetron (ZOFRAN) 8 MG tablet Take 1 tablet (8 mg total) by mouth 2 (two) times daily as needed for refractory nausea / vomiting. Start on day 3 after chemo. Patient not taking: Reported on 04/12/2017 11/20/16   Heath Lark, MD  oxyCODONE (OXY IR/ROXICODONE) 5 MG immediate release tablet Take 1 tablet (5 mg total)  by mouth every 4 (four) hours as needed for severe pain. Patient not taking: Reported on 04/12/2017 03/12/17   Heath Lark, MD    Family History Family History  Problem Relation Age of Onset  . Leukemia Mother 3       in remission  . Throat cancer Maternal Aunt        d.>50  . Alcohol abuse Maternal Uncle        d.>50    Social History Social History   Tobacco Use  . Smoking status: Former Smoker    Packs/day: 1.00    Years: 25.00    Pack years: 25.00    Types: Cigarettes    Last attempt to quit: 03/28/2015    Years since quitting: 2.0  . Smokeless tobacco: Never Used  Substance Use Topics  . Alcohol use: No    Alcohol/week: 0.0  oz    Comment: Previous h/o alcoholism, sober x 8 years  . Drug use: Yes    Types: Marijuana    Comment: remote h/o IVDU, has been worked up for parenterally transmitted infections multiple times since     Allergies   Patient has no known allergies.   Review of Systems Review of Systems  All other systems reviewed and are negative.    Physical Exam Updated Vital Signs BP (!) 149/81 (BP Location: Left Arm)   Pulse 79   Temp 98.2 F (36.8 C) (Oral)   Resp 16   Ht 1.651 m (5' 5" )   Wt 53.1 kg (117 lb)   SpO2 100%   BMI 19.47 kg/m   Physical Exam  Constitutional: She appears ill. No distress.  HENT:  Head: Normocephalic and atraumatic.  Right Ear: External ear normal.  Left Ear: External ear normal.  Eyes: Conjunctivae are normal. Right eye exhibits no discharge. Left eye exhibits no discharge. No scleral icterus.  Neck: Neck supple. No tracheal deviation present.  Cardiovascular: Normal rate, regular rhythm and intact distal pulses.  Pulmonary/Chest: Effort normal and breath sounds normal. No stridor. No respiratory distress. She has no wheezes. She has no rales.  Abdominal: Soft. Bowel sounds are normal. She exhibits no distension. There is tenderness in the suprapubic area. There is no rebound and no guarding.  Musculoskeletal: She exhibits no edema or tenderness.  Neurological: She is alert. She has normal strength. No cranial nerve deficit (no facial droop, extraocular movements intact, no slurred speech) or sensory deficit. She exhibits normal muscle tone. She displays no seizure activity. Coordination normal.  Skin: Skin is warm and dry. No rash noted.  Psychiatric: She has a normal mood and affect.  Nursing Morris and vitals reviewed.    ED Treatments / Results  Labs (all labs ordered are listed, but only abnormal results are displayed) Labs Reviewed  COMPREHENSIVE METABOLIC PANEL - Abnormal; Notable for the following components:      Result Value   Chloride  99 (*)    Glucose, Bld 104 (*)    All other components within normal limits  CBC WITH DIFFERENTIAL/PLATELET - Abnormal; Notable for the following components:   RBC 3.39 (*)    Hemoglobin 11.6 (*)    HCT 33.4 (*)    MCH 34.2 (*)    Platelets 148 (*)    All other components within normal limits  URINALYSIS, ROUTINE W REFLEX MICROSCOPIC - Abnormal; Notable for the following components:   Color, Urine STRAW (*)    All other components within normal limits  LIPASE, BLOOD  Radiology Ct Abdomen Pelvis W Contrast  Result Date: 04/12/2017 CLINICAL DATA:  Endometrial cancer, most recent chemotherapy 03/18/2017. Severe abdominal pain. EXAM: CT ABDOMEN AND PELVIS WITH CONTRAST TECHNIQUE: Multidetector CT imaging of the abdomen and pelvis was performed using the standard protocol following bolus administration of intravenous contrast. CONTRAST:  70m ISOVUE-300 IOPAMIDOL (ISOVUE-300) INJECTION 61%, 351mISOVUE-300 IOPAMIDOL (ISOVUE-300) INJECTION 61% COMPARISON:  01/30/2017 CT abdomen/ pelvis. FINDINGS: Lower chest: Centrilobular emphysema. No acute abnormality at the lung bases. Hepatobiliary: Normal liver size. Simple 1.8 cm segment 4A left liver lobe cyst. No new liver lesions. Normal gallbladder with no radiopaque cholelithiasis. No biliary ductal dilatation. Pancreas: No pancreatic mass or significant duct dilation. Stable solitary 2 mm pancreatic head calcification. No peripancreatic fat stranding or fluid collections. Spleen: Normal size. No mass. Adrenals/Urinary Tract: Normal adrenals. Mild right hydroureteronephrosis to the level of the distal lumbar segment of the right ureter is not appreciably changed. No left hydronephrosis. No renal masses. Mildly distended and otherwise normal bladder. Stomach/Bowel: Grossly normal stomach. Normal caliber small bowel with no small bowel wall thickening. Normal appendix. Normal caliber large bowel. No large bowel wall thickening or significant pericolonic  fat stranding. No significant colonic diverticulosis. Oral contrast transits to the transverse colon. Vascular/Lymphatic: Atherosclerotic nonaneurysmal abdominal aorta. Patent portal, splenic, hepatic and renal veins. New left para-aortic adenopathy measuring up to 1.5 cm (series 2/image 30), previously 0.7 cm. Newly enlarged 1.3 cm right external iliac node (series 2/image 61). Reproductive: Status post hysterectomy. Numerous clustered peritoneal soft-tissue nodules throughout the bilateral deep pelvis, intimately associated with the rectosigmoid junction and vaginal cuff, increased in size and number. Largest nodule measures 3.3 x 2.6 cm (series 2/ image 64), previously 1.7 x 1.6 cm, increased. Left perirectal 1.8 x 1.3 cm nodule, previously 1.0 x 0.6 cm, increased. Left vaginal cuff 2.6 x 2.5 cm nodule (series 2/ image 66), previously 0.8 x 0.8 cm, increased. High mid pelvic 1.2 x 1.1 cm nodule (series 2/image 50), previously 0.5 x 0.4 cm, increased. Other: No pneumoperitoneum, ascites or focal fluid collection. Musculoskeletal: No aggressive appearing focal osseous lesions. Mild lumbar spondylosis. IMPRESSION: 1. Significant progression of peritoneal metastatic disease in the pelvis since 01/30/2017 CT study. Numerous deep pelvic peritoneal metastases are increased in size and number and are intimately associated with the rectosigmoid region and vaginal cuff. 2. New retroperitoneal and right pelvic nodal metastases . 3. No evidence of bowel obstruction or acute bowel inflammation. 4. Stable chronic mild right hydroureteronephrosis to the level of the distal right lumbar ureter. No discrete obstructing mass, although probably due to extrinsic compression by tumor at the level of the right pelvic ureter. 5. Aortic Atherosclerosis (ICD10-I70.0) and Emphysema (ICD10-J43.9). Electronically Signed   By: JaIlona Sorrel.D.   On: 04/12/2017 14:42    Procedures Procedures (including critical care time)  Medications  Ordered in ED Medications  sodium chloride 0.9 % bolus 1,000 mL (0 mLs Intravenous Stopped 04/12/17 1137)    And  0.9 %  sodium chloride infusion ( Intravenous New Bag/Given 04/12/17 1145)  HYDROmorphone (DILAUDID) injection 0.5 mg (0.5 mg Intravenous Given 04/12/17 1222)  iopamidol (ISOVUE-300) 61 % injection (not administered)  iopamidol (ISOVUE-300) 61 % injection (not administered)  ondansetron (ZOFRAN) injection 4 mg (4 mg Intravenous Given 04/12/17 1058)  iopamidol (ISOVUE-300) 61 % injection 30 mL (30 mLs Oral Contrast Given 04/12/17 1326)  iopamidol (ISOVUE-300) 61 % injection 100 mL (80 mLs Intravenous Contrast Given 04/12/17 1326)     Initial Impression / Assessment and  Plan / ED Course  I have reviewed the triage vital signs and the nursing notes.  Pertinent labs & imaging results that were available during my care of the patient were reviewed by me and considered in my medical decision making (see chart for details).   Patient presented to the emergency room with worsening lower abdominal pain.  Unfortunately her CT scan shows new progression of her endometrial cancer.  I discussed the findings with the patient.  She would rather not be admitted to the hospital.  She is feeling somewhat better after treatment.  I spoke with her oncologist Dr. Alvy Bimler.  She will see the patient on Monday.  I was asked to give the patient a prescription of her Dilaudid and Xanax until she can see her oncologist on Monday.    Patient and her husband are comfortable with this plan  Final Clinical Impressions(s) / ED Diagnoses   Final diagnoses:  Metastatic cancer [facility])    ED Discharge Orders        Ordered    HYDROmorphone (DILAUDID) 4 MG tablet  Every 4 hours PRN     04/12/17 1539    ALPRAZolam (XANAX) 0.5 MG tablet  3 times daily PRN     04/12/17 1539       Dorie Rank, MD 04/12/17 1543

## 2017-04-15 ENCOUNTER — Ambulatory Visit (HOSPITAL_BASED_OUTPATIENT_CLINIC_OR_DEPARTMENT_OTHER): Payer: Self-pay | Admitting: Hematology and Oncology

## 2017-04-15 ENCOUNTER — Telehealth: Payer: Self-pay | Admitting: Hematology and Oncology

## 2017-04-15 ENCOUNTER — Encounter: Payer: Self-pay | Admitting: Hematology and Oncology

## 2017-04-15 VITALS — BP 116/79 | HR 104 | Temp 98.2°F | Resp 18 | Ht 65.0 in | Wt 115.2 lb

## 2017-04-15 DIAGNOSIS — Z7189 Other specified counseling: Secondary | ICD-10-CM

## 2017-04-15 DIAGNOSIS — G893 Neoplasm related pain (acute) (chronic): Secondary | ICD-10-CM

## 2017-04-15 DIAGNOSIS — R634 Abnormal weight loss: Secondary | ICD-10-CM

## 2017-04-15 DIAGNOSIS — C541 Malignant neoplasm of endometrium: Secondary | ICD-10-CM

## 2017-04-15 MED ORDER — HYDROMORPHONE HCL 8 MG PO TABS: 8.0000 mg | ORAL_TABLET | ORAL | 0 refills | Status: DC | PRN

## 2017-04-15 MED ORDER — METHADONE HCL 10 MG PO TABS: 10.0000 mg | ORAL_TABLET | Freq: Three times a day (TID) | ORAL | 0 refills | Status: DC

## 2017-04-15 MED ORDER — LIDOCAINE-PRILOCAINE 2.5-2.5 % EX CREA: 1.0000 "application " | TOPICAL_CREAM | CUTANEOUS | 6 refills | Status: DC | PRN

## 2017-04-15 MED FILL — METHADONE HCL 10 MG TABLET: 10 | 20 days supply | Qty: 60 | Fill #0

## 2017-04-15 MED FILL — LIDOCAINE-PRILOCAINE CREAM: 2.5-2.5 | 30 days supply | Qty: 30 | Fill #0

## 2017-04-15 MED FILL — HYDROmorphone HCL 8 MG TABS: 8 | 15 days supply | Qty: 90 | Fill #0

## 2017-04-15 NOTE — Assessment & Plan Note (Signed)
The patient is aware she has incurable disease and treatment is strictly palliative. We discussed importance of Advanced Directives and Living will. We discussed the poor prognosis I addressed all her questions and concerns

## 2017-04-15 NOTE — Progress Notes (Signed)
[facility] OFFICE PROGRESS NOTE  Patient Care Team: Patient, No Pcp Per as PCP - General (General Practice)  SUMMARY OF ONCOLOGIC HISTORY: Oncology History   MSI stable, Her 2 neg, ER patchy positivity     Endometrial cancer ([place])   04/05/2015 Imaging    CT abdomen:  1. Large bilateral hydrosalpinx/pyosalpinx. 2. Enlarged fibroid uterus. 3. Mild bilateral hydronephrosis from #1.       04/06/2015 Procedure    Status post CT-guided drainage of right and left tubo-ovarian abscess, with parallel drains from a right trans gluteal approach. Sample was sent from both the left and the right fluid collections to the lab.      04/19/2015 Imaging    CT abdomen and pelvis: The bilateral pelvic fluid collections have resolved following placement of the percutaneous drainage catheters. Small amount of fluid in the pelvis but no evidence for residual abscess collections or hydrosalpinx. Fibroid uterus. Innumerable uterine fibroids. Many of these fibroids are pedunculated. Development of a small left pleural effusion. Mild fat stranding in the abdominal and pelvic mesentery suggestive for mild edema      05/03/2015 Imaging    US abdomen and pelvis 1. Significantly regressed but not completely resolved bilateral hydrosalpinx/pyosalpinx. Residual is greater on the right (18 mm diameter) with mild fluid complexity. 2. Stable fibroid uterus. 3. No pelvic free fluid      07/06/2015 Pathology Results    PAP smear NEGATIVE FOR INTRAEPITHELIAL LESIONS OR MALIGNANCY.      07/18/2015 Pathology Results    Vulva, biopsy, mass - BENIGN EPIDERMAL CYST. - NO EVIDENCE OF MALIGNANCY      10/21/2016 Initial Diagnosis    She presented to the ER for severe abdominal pain      10/22/2016 Imaging    CT abdomen and pelvis 1. 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 2. Complex partially cystic mass at the left  adnexa, measuring 5.9 x 5.5 x 4.3 cm. This raises concern for primary ovarian malignancy. PET/CT could be considered for further evaluation. 3. Numerous uterine fibroids again noted, some which have increased in size. Underlying endometrial malignancy cannot be excluded. 4. Dilated bilateral fluid collections have reaccumulated within the pelvis, the largest of which measures 15 cm in length. 5. Enlarged bilateral pelvic sidewall nodes measure up to 1.6 cm in short axis, compatible with metastatic disease. Mildly prominent retroperitoneal nodes seen. Mildly prominent nodes about the left inguinal vessels. 6. 1.6 cm cyst at the medial right hepatic lobe.       10/29/2016 - 11/02/2016 Hospital Admission    She was admitted to [facility] for management of newly diagnosed endometrial cancer      10/29/2016 Tumor Marker    Patient's tumor was tested for the following markers: CA125 Results of the tumor marker test revealed 740      10/30/2016 Surgery    Procedure(s): - Open ureteral exploration - Cystourethroscopy - Left ureteral stent placement  Operative Findings:   1.) Normal cystourethrosocpy without lesions, masses or stones. Bilateral ureteral orifices identified with efflux of clear yellow urine 2.) Successful placement of left ureteral stent under visualization with appropriate curl visualized within the bladder and stent palpable within the ureter to the level of the renal pelvis 3) Open exploration determined ureter to be intact and viable.      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) - Carcinoma involves outer half of myometrium, serosal surface, and cervix - Bilateral ovaries / adnexa are positive for high grade serous carcinoma - Extensive lymphovascular space invasion is present - See  synoptic report and comment  B: "Free floating abdominal mass", removal  - Benign cyst with mesothelial-type lining, consistent with peritoneal inclusion cyst, size 7.0 cm - No malignancy identified   C: Posterior cul-de-sac mass, excision  - Involved by high grade serous carcinoma  D: Lymph nodes, left pelvic, resection  - Five of six lymph nodes involved by metastatic high grade serous carcinoma, size of metastases up to 1.5 cm, with no definite extracapsular extension identified (5/6)  E: Lymph nodes, right pelvic, excision  - Four of four lymph nodes involved by metastatic high grade serous carcinoma, size of metastases up to 1.6 cm, with no definite extracapsular extension identified (4/4)  F: Lymph nodes, right para-aortic, resection  - Three of four lymph nodes involved by metastatic high grade serous carcinoma, size of metastases up to 1.7 cm, with no definite extracapsular extension identified (3/4)  G: Omentum, omentectomy  - Involved by metastatic high grade serous carcinoma, size of nodules up to 2.9 cm   Given the presence of a large endometrial mass with serous endometrial intraepithelial carcinoma as well as deep myometrial invasion and cervical and serosal involvement, the tumor is staged as an endometrial primary. However, ovarian primary with spread to the uterus or synchronous primary tumors are also a diagnostic possibility, and definitive site of origin cannot be determined histologically. Extensive lymphovascular space invasion is present, and the tumor involves both ovaries, peritoneum, omentum, and multiple lymph nodes. No distinct fallopian tubes are identified grossly or microscopically; however, they may have been incorporated into the ovarian / adnexal tumor masses.  Immunohistochemical stains are performed on block A11 and demonstrate the tumor is positive for p53 and WT1 and is strongly diffusely positive for p16. It shows patchy focal staining for ER. WT1  staining is also positive in block A15, the right ovary. 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 Interval hysterectomy and likely oophorectomy, and possibly partial omentectomy. There is infiltration and nodularity of the upper omentum compatible with tumor along with complex density along the paracolic gutters, space of Retzius, and presacral space which may also represent tumor infiltration. 2. Soft tissue density favoring tumor along the right posterior piriformis muscle extending towards the vaginal cuff and cul-de-sac region. This likely represents tumor. 3. Stable or minimally increased retroperitoneal and pelvic adenopathy. This is thought to be malignant. 4. Reduced ascites. 5. Mild bilateral hydronephrosis and proximal hydroureter extending down to the iliac vessel cross over is were there is stranding and some mild adenopathy. This likely represents low-grade partial ureteral obstruction and is most likely from extrinsic mass effect. 6. Aortic Atherosclerosis (ICD10-I70.0) and Emphysema (ICD10-J43.9). 7. Presacral and perirectal tumor nodularity.      11/20/2016 Tumor Marker    Patient's tumor was tested for the following markers: CA-125. Results of the tumor marker test revealed 154.2.      11/29/2016 Genetic Testing    Patient has genetic testing done for MSI instability Results revealed patient has no MSI instability      11/30/2016 - 03/18/2017 Chemotherapy    She received carboplatin & Taxol x 6  cycles       01/04/2017 Genetic Testing    No mutations were identified in 83 gene analyzed for hereditary cancer risk. VUSs were noted in ATM and GPC3.       01/10/2017 Tumor Marker    Patient's tumor was tested for the following markers: CA125 Results of the tumor marker test revealed 23.5      01/30/2017 Imaging    1. Moderate to marked response to therapy. 2. Resolution  of abdominopelvic adenopathy. 3. Resolution of omental nodularity. Significantly improved peritoneal disease within the pelvis. 4. Improvement in trace right-sided caliectasis and proximal right hydroureter. No well-defined obstructive mass. 5. Age advanced aortic atherosclerosis. 6. Mild bladder wall thickening could be due to under distention and/or cystitis.      04/12/2017 Imaging    Ct abdomen and pelvis 1. Significant progression of peritoneal metastatic disease in the pelvis since 01/30/2017 CT study. Numerous deep pelvic peritoneal metastases are increased in size and number and are intimately associated with the rectosigmoid region and vaginal cuff. 2. New retroperitoneal and right pelvic nodal metastases . 3. No evidence of bowel obstruction or acute bowel inflammation. 4. Stable chronic mild right hydroureteronephrosis to the level of the distal right lumbar ureter. No discrete obstructing mass, although probably due to extrinsic compression by tumor at the level of the right pelvic ureter. 5. Aortic Atherosclerosis (ICD10-I70.0) and Emphysema (ICD10-J43.9).        INTERVAL HISTORY: Please see below for problem oriented charting. She returns for further follow-up She is here accompanied by her husband who has multiple questions and concerns She has poorly controlled pain She has been taking Dilaudid every 4 hours and still have pain Her appetite is poor Have lost some weight No nausea no vomiting Denies constipation She is sad with recent CT finding  REVIEW OF SYSTEMS:   Constitutional: Denies fevers, chills  Eyes: Denies blurriness of vision Ears, nose, mouth, throat, and face: Denies mucositis or sore throat Respiratory: Denies cough, dyspnea or wheezes Cardiovascular: Denies palpitation, chest discomfort or lower extremity swelling Gastrointestinal:  Denies nausea, heartburn or change in bowel habits Skin: Denies abnormal skin rashes Lymphatics: Denies new  lymphadenopathy or easy bruising Neurological:Denies numbness, tingling or new weaknesses Behavioral/Psych: Mood is stable, no new changes  All other systems were reviewed with the patient and are negative.  I have reviewed the past medical history, past surgical history, social history and family history with the patient and they are unchanged from previous note.  ALLERGIES:  has No Known Allergies.  MEDICATIONS:  Current Outpatient Medications  Medication Sig Dispense Refill  . acetaminophen (TYLENOL) 325 MG tablet Take 650 mg by mouth every 6 (six) hours as needed.    . ALPRAZolam (XANAX) 0.5 MG tablet Take 1 tablet (0.5 mg total) 3 (three) times daily as needed by mouth for anxiety. 15 tablet 0  . HYDROmorphone (DILAUDID) 8 MG tablet Take 1 tablet (8 mg total) every 4 (four) hours as needed by mouth for severe pain. 90 tablet 0  . lidocaine-prilocaine (EMLA) cream Apply 1 application as needed topically. 30 g 6  . methadone (DOLOPHINE) 10 MG tablet Take 1 tablet (10 mg total) every 8 (eight) hours by mouth. 60 tablet 0   No current facility-administered medications for this visit.     PHYSICAL EXAMINATION: ECOG PERFORMANCE STATUS: 2 - Symptomatic, <50% confined to bed  Vitals:   04/15/17 1147  BP: 116/79  Pulse: (!) 104  Resp: 18  Temp: 98.2 F (36.8  C)  SpO2: 98%   Filed Weights   04/15/17 1147  Weight: 115 lb 3.2 oz (52.3 kg)    GENERAL:alert, no distress and comfortable.  She looks thin and cachectic SKIN: skin color, texture, turgor are normal, no rashes or significant lesions EYES: normal, Conjunctiva are pink and non-injected, sclera clear Musculoskeletal:no cyanosis of digits and no clubbing  NEURO: alert & oriented x 3 with fluent speech, no focal motor/sensory deficits  LABORATORY DATA:  I have reviewed the data as listed    Component Value Date/Time   NA 135 04/12/2017 1039   NA 141 04/10/2017 1101   K 3.5 04/12/2017 1039   K 4.0 04/10/2017 1101   CL  99 (L) 04/12/2017 1039   CO2 28 04/12/2017 1039   CO2 28 04/10/2017 1101   GLUCOSE 104 (H) 04/12/2017 1039   GLUCOSE 108 04/10/2017 1101   BUN 12 04/12/2017 1039   BUN 6.6 (L) 04/10/2017 1101   CREATININE 0.50 04/12/2017 1039   CREATININE 0.8 04/10/2017 1101   CALCIUM 9.4 04/12/2017 1039   CALCIUM 10.2 04/10/2017 1101   PROT 7.2 04/12/2017 1039   PROT 7.9 04/10/2017 1101   ALBUMIN 4.1 04/12/2017 1039   ALBUMIN 4.0 04/10/2017 1101   AST 20 04/12/2017 1039   AST 19 04/10/2017 1101   ALT 18 04/12/2017 1039   ALT 13 04/10/2017 1101   ALKPHOS 72 04/12/2017 1039   ALKPHOS 74 04/10/2017 1101   BILITOT 0.4 04/12/2017 1039   BILITOT 0.24 04/10/2017 1101   GFRNONAA >60 04/12/2017 1039   GFRAA >60 04/12/2017 1039    No results found for: SPEP, UPEP  Lab Results  Component Value Date   WBC 4.5 04/12/2017   NEUTROABS 2.6 04/12/2017   HGB 11.6 (L) 04/12/2017   HCT 33.4 (L) 04/12/2017   MCV 98.5 04/12/2017   PLT 148 (L) 04/12/2017      Chemistry      Component Value Date/Time   NA 135 04/12/2017 1039   NA 141 04/10/2017 1101   K 3.5 04/12/2017 1039   K 4.0 04/10/2017 1101   CL 99 (L) 04/12/2017 1039   CO2 28 04/12/2017 1039   CO2 28 04/10/2017 1101   BUN 12 04/12/2017 1039   BUN 6.6 (L) 04/10/2017 1101   CREATININE 0.50 04/12/2017 1039   CREATININE 0.8 04/10/2017 1101      Component Value Date/Time   CALCIUM 9.4 04/12/2017 1039   CALCIUM 10.2 04/10/2017 1101   ALKPHOS 72 04/12/2017 1039   ALKPHOS 74 04/10/2017 1101   AST 20 04/12/2017 1039   AST 19 04/10/2017 1101   ALT 18 04/12/2017 1039   ALT 13 04/10/2017 1101   BILITOT 0.4 04/12/2017 1039   BILITOT 0.24 04/10/2017 1101       RADIOGRAPHIC STUDIES: I have personally reviewed the radiological images as listed and agreed with the findings in the report. Ct Abdomen Pelvis W Contrast  Result Date: 04/12/2017 CLINICAL DATA:  Endometrial cancer, most recent chemotherapy 03/18/2017. Severe abdominal pain. EXAM:  CT ABDOMEN AND PELVIS WITH CONTRAST TECHNIQUE: Multidetector CT imaging of the abdomen and pelvis was performed using the standard protocol following bolus administration of intravenous contrast. CONTRAST:  32m ISOVUE-300 IOPAMIDOL (ISOVUE-300) INJECTION 61%, 345mISOVUE-300 IOPAMIDOL (ISOVUE-300) INJECTION 61% COMPARISON:  01/30/2017 CT abdomen/ pelvis. FINDINGS: Lower chest: Centrilobular emphysema. No acute abnormality at the lung bases. Hepatobiliary: Normal liver size. Simple 1.8 cm segment 4A left liver lobe cyst. No new liver lesions. Normal gallbladder with no radiopaque cholelithiasis.  No biliary ductal dilatation. Pancreas: No pancreatic mass or significant duct dilation. Stable solitary 2 mm pancreatic head calcification. No peripancreatic fat stranding or fluid collections. Spleen: Normal size. No mass. Adrenals/Urinary Tract: Normal adrenals. Mild right hydroureteronephrosis to the level of the distal lumbar segment of the right ureter is not appreciably changed. No left hydronephrosis. No renal masses. Mildly distended and otherwise normal bladder. Stomach/Bowel: Grossly normal stomach. Normal caliber small bowel with no small bowel wall thickening. Normal appendix. Normal caliber large bowel. No large bowel wall thickening or significant pericolonic fat stranding. No significant colonic diverticulosis. Oral contrast transits to the transverse colon. Vascular/Lymphatic: Atherosclerotic nonaneurysmal abdominal aorta. Patent portal, splenic, hepatic and renal veins. New left para-aortic adenopathy measuring up to 1.5 cm (series 2/image 30), previously 0.7 cm. Newly enlarged 1.3 cm right external iliac node (series 2/image 61). Reproductive: Status post hysterectomy. Numerous clustered peritoneal soft-tissue nodules throughout the bilateral deep pelvis, intimately associated with the rectosigmoid junction and vaginal cuff, increased in size and number. Largest nodule measures 3.3 x 2.6 cm (series 2/  image 64), previously 1.7 x 1.6 cm, increased. Left perirectal 1.8 x 1.3 cm nodule, previously 1.0 x 0.6 cm, increased. Left vaginal cuff 2.6 x 2.5 cm nodule (series 2/ image 66), previously 0.8 x 0.8 cm, increased. High mid pelvic 1.2 x 1.1 cm nodule (series 2/image 50), previously 0.5 x 0.4 cm, increased. Other: No pneumoperitoneum, ascites or focal fluid collection. Musculoskeletal: No aggressive appearing focal osseous lesions. Mild lumbar spondylosis. IMPRESSION: 1. Significant progression of peritoneal metastatic disease in the pelvis since 01/30/2017 CT study. Numerous deep pelvic peritoneal metastases are increased in size and number and are intimately associated with the rectosigmoid region and vaginal cuff. 2. New retroperitoneal and right pelvic nodal metastases . 3. No evidence of bowel obstruction or acute bowel inflammation. 4. Stable chronic mild right hydroureteronephrosis to the level of the distal right lumbar ureter. No discrete obstructing mass, although probably due to extrinsic compression by tumor at the level of the right pelvic ureter. 5. Aortic Atherosclerosis (ICD10-I70.0) and Emphysema (ICD10-J43.9). Electronically Signed   By: Ilona Sorrel M.D.   On: 04/12/2017 14:42    ASSESSMENT & PLAN:  Endometrial cancer ([place]) Unfortunately, the patient has primary refractory disease She has significant disease burden and is symptomatic We discussed the risks, benefits, side effects of various treatment options We also discussed clinical trial Ultimately, the patient would like to pursue additional systemic chemotherapy We reviewed the current guidelines The recommendation is based on publication below:  J Clin Oncol. 2004 Oct 1;22(19):3902-8. Phase III trial of doxorubicin with or without cisplatin in advanced endometrial carcinoma: a gynecologic oncology group study. Thigpen JT1, Mirian Mo, Homesley HD, Malfetano J, DuBeshter B, [place], Rosamaria Lints. Abstract PURPOSE:  Doxorubicin and  cisplatin have activity in endometrial carcinoma and at initiation of this study ranked as the most active agents. This trial of stage III, IV, or recurrent disease evaluated whether combining these agents increases response rate (RR) and prolongs progression-free survival (PFS) and overall survival (OS) over doxorubicin alone. PATIENTS AND METHODS:  Of 299 patients registered, 281 (94%) were eligible. Regimens were doxorubicin 60 mg/m(2) intravenously or doxorubicin 60 mg/m(2) plus cisplatin 50 mg/m(2) every 3 weeks until disease progression, unacceptable toxicity, or a total of 500 mg/m(2) doxorubicin. RESULTS:  There were 12 (8%) complete (CR) and 26 (17%) partial responses (PR) among 150 patients receiving doxorubicin versus 25 (19%) CRs and 30 (23%) PRs among patients receiving the combination. The overall  response rate was higher among patients receiving the combination (42%) compared with patients receiving doxorubicin (25%; P =.004). Median PFS was 5.7 and 3.8 months, respectively, for the combination and single agent. The PFS hazard ratio was 0.736 (95% CI, 0.577 to 0.939; P =.014). Median OS was 9.0 and 9.2 months, respectively, for the combination and single agent. Overall death rates were similar in the two groups (hazard ratio, 0.928; 95% CI, 0.727 to 1.185). Nausea, vomiting, and hematologic toxicities were common. The combination produced more grade 3 to 4 leukopenia (62% v 40%), thrombocytopenia (14% v 2%), anemia (22% v 4%), and nausea/vomiting (13% v 3%). CONCLUSION:  Adding cisplatin to doxorubicin in advanced endometrial carcinoma improves RR and PFS with a negligible impact on OS and produces increased toxicity. These results have served as a building block for subsequent phase III trials in patients with disseminated and high-risk limited endometrial carcinoma.  We discussed the role of chemotherapy. The intent is of palliative intent.  We discussed some of the risks, benefits,  side-effects of cisplatin  Some of the short term side-effects included, though not limited to, including weight loss, life threatening infections, risk of allergic reactions, need for transfusions of blood products, nausea, vomiting, change in bowel habits, loss of hair, risk of congestive heart failure, admission to hospital for various reasons, and risks of death.   Long term side-effects are also discussed including risks of infertility, permanent damage to nerve function, hearing loss, chronic fatigue, kidney damage with possibility needing hemodialysis, and rare secondary malignancy including bone marrow disorders.  The patient is aware that the response rates discussed earlier is not guaranteed.  After a long discussion, patient made an informed decision to proceed with the prescribed plan of care.  I recommend port placement and baseline echocardiogram We will see her back next week for symptom management I will tentatively schedule first dose of chemotherapy around April 24, 2017  Cancer associated pain She has severe cancer associated pain I recommend starting her on methadone for long-acting pain medicine and Dilaudid as needed for breakthrough pain I would reassess pain control next week She is warned about risk of sedation and constipation  Weight loss She has poor appetite, likely due to uncontrolled pain We will get dietitian to see her in her next visit She will continue nutritional supplements as tolerated  Goals of care, counseling/discussion The patient is aware she has incurable disease and treatment is strictly palliative. We discussed importance of Advanced Directives and Living will. We discussed the poor prognosis I addressed all her questions and concerns   Orders Placed This Encounter  Procedures  . IR FLUORO GUIDE PORT INSERTION RIGHT    Standing Status:   Future    Standing Expiration Date:   06/15/2018    Order Specific Question:   Reason for Exam  (SYMPTOM  OR DIAGNOSIS REQUIRED)    Answer:   for chemo    Order Specific Question:   Preferred Imaging Location?    Answer:   [facility]  . Amb Referral to Nutrition and Diabetic Education (specifically to Ernestene Kiel)    Referral Priority:   Routine    Referral Type:   Consultation    Referral Reason:   Specialty Services Required    Number of Visits Requested:   1  . ECHOCARDIOGRAM LIMITED    Standing Status:   Future    Standing Expiration Date:   07/16/2018    Order Specific Question:   Where should this test  be performed    Answer:   [facility]    Order Specific Question:   Perflutren DEFINITY (image enhancing agent) should be administered unless hypersensitivity or allergy exist    Answer:   Administer Perflutren    Order Specific Question:   Expected Date:    Answer:   1 week   All questions were answered. The patient knows to call the clinic with any problems, questions or concerns. No barriers to learning was detected. I spent 60 minutes counseling the patient face to face. The total time spent in the appointment was 70 minutes and more than 50% was on counseling and review of test results     Heath Lark, MD 04/15/2017 3:51 PM

## 2017-04-15 NOTE — Progress Notes (Signed)
DISCONTINUE ON PATHWAY REGIMEN - Uterine     A cycle is every 21 days:     Paclitaxel      Carboplatin   **Always confirm dose/schedule in your pharmacy ordering system**    REASON: Disease Progression PRIOR TREATMENT: UTOS50: Carboplatin/Paclitaxel (6/175) q21 Days x 6 Cycles TREATMENT RESPONSE: Progressive Disease (PD)  START OFF PATHWAY REGIMEN - Uterine   [id number]:Cisplatin + Doxorubicin:   A cycle is every 21 days:     Doxorubicin      Cisplatin   **Always confirm dose/schedule in your pharmacy ordering system**    Patient Characteristics: Papillary Serous and Clear Cell Histology, Second Line, Relapse < 6 Months From Prior Therapy AJCC T Category: T3 AJCC N Category: N2 AJCC M Category: M1 AJCC 8 Stage Grouping: IVB Would you be surprised if this patient died  in the next year<= I would be surprised if this patient died in the next year Line of therapy: Second Line Time to Recurrence: Relapse < 6 Months From Prior Therapy Intent of Therapy: Non-Curative / Palliative Intent, Discussed with Patient

## 2017-04-15 NOTE — Assessment & Plan Note (Signed)
Unfortunately, the patient has primary refractory disease She has significant disease burden and is symptomatic We discussed the risks, benefits, side effects of various treatment options We also discussed clinical trial Ultimately, the patient would like to pursue additional systemic chemotherapy We reviewed the current guidelines The recommendation is based on publication below:  J Clin Oncol. 2004 Oct 1;22(19):3902-8. Phase III trial of doxorubicin with or without cisplatin in advanced endometrial carcinoma: a gynecologic oncology group study. Thigpen JT1, Mirian Mo, Homesley HD, Malfetano J, DuBeshter B, [facility], Rosamaria Lints. Abstract PURPOSE:  Doxorubicin and cisplatin have activity in endometrial carcinoma and at initiation of this study ranked as the most active agents. This trial of stage III, IV, or recurrent disease evaluated whether combining these agents increases response rate (RR) and prolongs progression-free survival (PFS) and overall survival (OS) over doxorubicin alone. PATIENTS AND METHODS:  Of 299 patients registered, 281 (94%) were eligible. Regimens were doxorubicin 60 mg/m(2) intravenously or doxorubicin 60 mg/m(2) plus cisplatin 50 mg/m(2) every 3 weeks until disease progression, unacceptable toxicity, or a total of 500 mg/m(2) doxorubicin. RESULTS:  There were 12 (8%) complete (CR) and 26 (17%) partial responses (PR) among 150 patients receiving doxorubicin versus 25 (19%) CRs and 30 (23%) PRs among patients receiving the combination. The overall response rate was higher among patients receiving the combination (42%) compared with patients receiving doxorubicin (25%; P =.004). Median PFS was 5.7 and 3.8 months, respectively, for the combination and single agent. The PFS hazard ratio was 0.736 (95% CI, 0.577 to 0.939; P =.014). Median OS was 9.0 and 9.2 months, respectively, for the combination and single agent. Overall death rates were similar in the two groups (hazard ratio,  0.928; 95% CI, 0.727 to 1.185). Nausea, vomiting, and hematologic toxicities were common. The combination produced more grade 3 to 4 leukopenia (62% v 40%), thrombocytopenia (14% v 2%), anemia (22% v 4%), and nausea/vomiting (13% v 3%). CONCLUSION:  Adding cisplatin to doxorubicin in advanced endometrial carcinoma improves RR and PFS with a negligible impact on OS and produces increased toxicity. These results have served as a building block for subsequent phase III trials in patients with disseminated and high-risk limited endometrial carcinoma.  We discussed the role of chemotherapy. The intent is of palliative intent.  We discussed some of the risks, benefits, side-effects of cisplatin  Some of the short term side-effects included, though not limited to, including weight loss, life threatening infections, risk of allergic reactions, need for transfusions of blood products, nausea, vomiting, change in bowel habits, loss of hair, risk of congestive heart failure, admission to hospital for various reasons, and risks of death.   Long term side-effects are also discussed including risks of infertility, permanent damage to nerve function, hearing loss, chronic fatigue, kidney damage with possibility needing hemodialysis, and rare secondary malignancy including bone marrow disorders.  The patient is aware that the response rates discussed earlier is not guaranteed.  After a long discussion, patient made an informed decision to proceed with the prescribed plan of care.  I recommend port placement and baseline echocardiogram We will see her back next week for symptom management I will tentatively schedule first dose of chemotherapy around April 24, 2017

## 2017-04-15 NOTE — Assessment & Plan Note (Signed)
She has poor appetite, likely due to uncontrolled pain We will get dietitian to see her in her next visit She will continue nutritional supplements as tolerated

## 2017-04-15 NOTE — Assessment & Plan Note (Signed)
She has severe cancer associated pain I recommend starting her on methadone for long-acting pain medicine and Dilaudid as needed for breakthrough pain I would reassess pain control next week She is warned about risk of sedation and constipation

## 2017-04-15 NOTE — Telephone Encounter (Signed)
Scheduled appt per 11/19 los - patient is aware of 11/26 appt - my chart active - unable to schedule treatment due to capped day - message sent to [place]

## 2017-04-17 ENCOUNTER — Encounter: Payer: Self-pay | Admitting: Hematology and Oncology

## 2017-04-17 ENCOUNTER — Ambulatory Visit (HOSPITAL_COMMUNITY): Payer: Self-pay | Attending: Internal Medicine

## 2017-04-17 ENCOUNTER — Telehealth: Payer: Self-pay

## 2017-04-17 ENCOUNTER — Other Ambulatory Visit: Payer: Self-pay

## 2017-04-17 DIAGNOSIS — I071 Rheumatic tricuspid insufficiency: Secondary | ICD-10-CM | POA: Insufficient documentation

## 2017-04-17 DIAGNOSIS — C541 Malignant neoplasm of endometrium: Secondary | ICD-10-CM | POA: Insufficient documentation

## 2017-04-17 NOTE — Telephone Encounter (Signed)
Called patient and scheduled nut appointment to be followed by Md. appointment

## 2017-04-22 ENCOUNTER — Telehealth: Payer: Self-pay | Admitting: Hematology and Oncology

## 2017-04-22 ENCOUNTER — Other Ambulatory Visit: Payer: Self-pay | Admitting: Student

## 2017-04-22 ENCOUNTER — Ambulatory Visit: Payer: Self-pay | Admitting: Nutrition

## 2017-04-22 ENCOUNTER — Ambulatory Visit (HOSPITAL_BASED_OUTPATIENT_CLINIC_OR_DEPARTMENT_OTHER): Payer: Self-pay | Admitting: Hematology and Oncology

## 2017-04-22 VITALS — BP 136/83 | HR 76 | Temp 98.2°F | Resp 18 | Ht 65.0 in | Wt 115.4 lb

## 2017-04-22 DIAGNOSIS — C541 Malignant neoplasm of endometrium: Secondary | ICD-10-CM

## 2017-04-22 DIAGNOSIS — T451X5A Adverse effect of antineoplastic and immunosuppressive drugs, initial encounter: Secondary | ICD-10-CM

## 2017-04-22 DIAGNOSIS — D701 Agranulocytosis secondary to cancer chemotherapy: Secondary | ICD-10-CM

## 2017-04-22 DIAGNOSIS — R634 Abnormal weight loss: Secondary | ICD-10-CM

## 2017-04-22 DIAGNOSIS — Z7189 Other specified counseling: Secondary | ICD-10-CM

## 2017-04-22 DIAGNOSIS — G893 Neoplasm related pain (acute) (chronic): Secondary | ICD-10-CM

## 2017-04-22 NOTE — Telephone Encounter (Signed)
Gave avs and calendar for December  °

## 2017-04-22 NOTE — Progress Notes (Signed)
51 year old female diagnosed with endometrial cancer.  She is receiving palliative treatment.  Past medical history includes depression and anxiety.  Medications include Xanax.  Labs include glucose 104.  Height: 65 inches. Weight: 115 pounds. Usual body weight: 128 pounds. BMI: 19.17.  Patient endorses weight loss and poor appetite. She follows a low fiber diet to avoid bowel obstruction. She would like information on a healthier oral nutrition supplement. Patient's husband has many questions regarding complementary/alternative medicines.  Nutrition diagnosis:  Unintentional weight loss related to inadequate oral intake as evidenced by 13 pound weight loss from usual body weight.  Intervention: Patient educated to consume high-calorie, high-protein foods in small amounts throughout the day. Encouraged her to use herbs and spices for flavoring. Recommended patient try Orgain, which is an oral nutrition supplement. Educated patient on low fiber, fruits and vegetables. Provided fact sheets on poor appetite, low fiber diet. Questions were answered.  Teach back method used.  Monitoring, evaluation, goals: Patient will work to increase calories and protein to minimize further weight loss.  Next visit: Patient will contact me for questions.  **Disclaimer: This note was dictated with voice recognition software. Similar sounding words can inadvertently be transcribed and this note may contain transcription errors which may not have been corrected upon publication of note.**

## 2017-04-23 ENCOUNTER — Encounter (HOSPITAL_COMMUNITY): Payer: Self-pay

## 2017-04-23 ENCOUNTER — Other Ambulatory Visit: Payer: Self-pay | Admitting: Hematology and Oncology

## 2017-04-23 ENCOUNTER — Ambulatory Visit (HOSPITAL_COMMUNITY)
Admission: RE | Admit: 2017-04-23 | Discharge: 2017-04-23 | Disposition: A | Discharge status: Home / Self Care | Payer: Self-pay | Source: Ambulatory Visit | Attending: Hematology and Oncology | Admitting: Hematology and Oncology

## 2017-04-23 ENCOUNTER — Encounter: Payer: Self-pay | Admitting: Hematology and Oncology

## 2017-04-23 DIAGNOSIS — F419 Anxiety disorder, unspecified: Secondary | ICD-10-CM | POA: Insufficient documentation

## 2017-04-23 DIAGNOSIS — C541 Malignant neoplasm of endometrium: Secondary | ICD-10-CM

## 2017-04-23 DIAGNOSIS — Z87891 Personal history of nicotine dependence: Secondary | ICD-10-CM | POA: Insufficient documentation

## 2017-04-23 DIAGNOSIS — F329 Major depressive disorder, single episode, unspecified: Secondary | ICD-10-CM | POA: Insufficient documentation

## 2017-04-23 DIAGNOSIS — C786 Secondary malignant neoplasm of retroperitoneum and peritoneum: Secondary | ICD-10-CM | POA: Insufficient documentation

## 2017-04-23 HISTORY — PX: IR FLUORO GUIDE PORT INSERTION RIGHT: IMG5741

## 2017-04-23 HISTORY — PX: IR US GUIDE VASC ACCESS RIGHT: IMG2390

## 2017-04-23 LAB — CBC
HCT: 35 % — ABNORMAL LOW (ref 36.0–46.0)
Hemoglobin: 12 g/dL (ref 12.0–15.0)
MCH: 33.8 pg (ref 26.0–34.0)
MCHC: 34.3 g/dL (ref 30.0–36.0)
MCV: 98.6 fL (ref 78.0–100.0)
PLATELETS: 215 10*3/uL (ref 150–400)
RBC: 3.55 MIL/uL — AB (ref 3.87–5.11)
RDW: 13.6 % (ref 11.5–15.5)
WBC: 3.7 10*3/uL — AB (ref 4.0–10.5)

## 2017-04-23 LAB — COMPREHENSIVE METABOLIC PANEL
ALT: 17 U/L (ref 14–54)
ANION GAP: 9 (ref 5–15)
AST: 20 U/L (ref 15–41)
Albumin: 3.6 g/dL (ref 3.5–5.0)
Alkaline Phosphatase: 71 U/L (ref 38–126)
BUN: 8 mg/dL (ref 6–20)
CALCIUM: 9.3 mg/dL (ref 8.9–10.3)
CHLORIDE: 98 mmol/L — AB (ref 101–111)
CO2: 28 mmol/L (ref 22–32)
Creatinine, Ser: 0.67 mg/dL (ref 0.44–1.00)
GFR calc non Af Amer: >60 mL/min (ref >60)
Glucose, Bld: 143 mg/dL — ABNORMAL HIGH (ref 65–99)
Potassium: 3.6 mmol/L (ref 3.5–5.1)
SODIUM: 135 mmol/L (ref 135–145)
Total Bilirubin: 0.5 mg/dL (ref 0.3–1.2)
Total Protein: 6.6 g/dL (ref 6.5–8.1)

## 2017-04-23 LAB — PROTIME-INR
INR: 0.91
PROTHROMBIN TIME: 12.1 s (ref 11.4–15.2)

## 2017-04-23 LAB — APTT: aPTT: 34 seconds (ref 24–36)

## 2017-04-23 LAB — MAGNESIUM: MAGNESIUM: 1.9 mg/dL (ref 1.7–2.4)

## 2017-04-23 MED ORDER — FENTANYL CITRATE (PF) 100 MCG/2ML IJ SOLN
INTRAMUSCULAR | Status: AC | PRN
  Administered 2017-04-23: 50 ug via INTRAVENOUS
  Administered 2017-04-23: 25 ug via INTRAVENOUS
  Administered 2017-04-23: 50 ug via INTRAVENOUS
  Administered 2017-04-23 (×2): 25 ug via INTRAVENOUS

## 2017-04-23 MED ORDER — HEPARIN SOD (PORK) LOCK FLUSH 100 UNIT/ML IV SOLN
INTRAVENOUS | Status: AC | PRN
  Administered 2017-04-23: 500 [IU] via INTRAVENOUS

## 2017-04-23 MED ORDER — LIDOCAINE HCL (PF) 1 % IJ SOLN
INTRAMUSCULAR | Status: AC | PRN
  Administered 2017-04-23: 20 mL

## 2017-04-23 MED ORDER — LIDOCAINE HCL (PF) 1 % IJ SOLN
INTRAMUSCULAR | Status: AC
  Filled 2017-04-23: qty 30

## 2017-04-23 MED ORDER — FENTANYL CITRATE (PF) 100 MCG/2ML IJ SOLN
INTRAMUSCULAR | Status: AC
  Filled 2017-04-23: qty 4

## 2017-04-23 MED ORDER — SODIUM CHLORIDE 0.9 % IV SOLN: INTRAVENOUS | Status: DC

## 2017-04-23 MED ORDER — CEFAZOLIN SODIUM-DEXTROSE 2-4 GM/100ML-% IV SOLN
INTRAVENOUS | Status: AC
  Filled 2017-04-23: qty 100

## 2017-04-23 MED ORDER — HEPARIN SOD (PORK) LOCK FLUSH 100 UNIT/ML IV SOLN
INTRAVENOUS | Status: AC
  Filled 2017-04-23: qty 5

## 2017-04-23 MED ORDER — SODIUM CHLORIDE 0.9 % IV SOLN
INTRAVENOUS | Status: AC | PRN
  Administered 2017-04-23: 10 mL/h via INTRAVENOUS

## 2017-04-23 MED ORDER — MIDAZOLAM HCL 2 MG/2ML IJ SOLN
INTRAMUSCULAR | Status: AC
  Filled 2017-04-23: qty 4

## 2017-04-23 MED ORDER — HYDROMORPHONE HCL 1 MG/ML IJ SOLN
INTRAMUSCULAR | Status: AC
  Filled 2017-04-23: qty 1

## 2017-04-23 MED ORDER — MIDAZOLAM HCL 2 MG/2ML IJ SOLN
INTRAMUSCULAR | Status: AC | PRN
  Administered 2017-04-23: 1 mg via INTRAVENOUS
  Administered 2017-04-23 (×2): 0.5 mg via INTRAVENOUS
  Administered 2017-04-23: 1 mg via INTRAVENOUS

## 2017-04-23 MED ORDER — CEFAZOLIN SODIUM-DEXTROSE 2-4 GM/100ML-% IV SOLN
2.0000 g | INTRAVENOUS | Status: AC
  Administered 2017-04-23: 2 g via INTRAVENOUS

## 2017-04-23 NOTE — Assessment & Plan Note (Signed)
She had leukopenia in her previous treatment and recurrent infection I would recommend G-CSF coverage for future chemo

## 2017-04-23 NOTE — Discharge Instructions (Addendum)
Implanted Port Insertion, Care After °This sheet gives you information about how to care for yourself after your procedure. Your health care provider may also give you more specific instructions. If you have problems or questions, contact your health care provider. °What can I expect after the procedure? °After your procedure, it is common to have: °· Discomfort at the port insertion site. °· Bruising on the skin over the port. This should improve over 3-4 days. ° °Follow these instructions at home: °Port care °· After your port is placed, you will get a manufacturer's information card. The card has information about your port. Keep this card with you at all times. °· Take care of the port as told by your health care provider. Ask your health care provider if you or a family member can get training for taking care of the port at home. A home health care nurse may also take care of the port. °· Make sure to remember what type of port you have. °Incision care °· Follow instructions from your health care provider about how to take care of your port insertion site. Make sure you: °? Wash your hands with soap and water before you change your bandage (dressing). If soap and water are not available, use hand sanitizer. °? Change your dressing as told by your health care provider. °? Leave stitches (sutures), skin glue, or adhesive strips in place. These skin closures may need to stay in place for 2 weeks or longer. If adhesive strip edges start to loosen and curl up, you may trim the loose edges. Do not remove adhesive strips completely unless your health care provider tells you to do that. °· Check your port insertion site every day for signs of infection. Check for: °? More redness, swelling, or pain. °? More fluid or blood. °? Warmth. °? Pus or a bad smell. °General instructions °· Do not take baths, swim, or use a hot tub until your health care provider approves. °· Do not lift anything that is heavier than 10 lb (4.5  kg) for a week, or as told by your health care provider. °· Ask your health care provider when it is okay to: °? Return to work or school. °? Resume usual physical activities or sports. °· Do not drive for 24 hours if you were given a medicine to help you relax (sedative). °· Take over-the-counter and prescription medicines only as told by your health care provider. °· Wear a medical alert bracelet in case of an emergency. This will tell any health care providers that you have a port. °· Keep all follow-up visits as told by your health care provider. This is important. °Contact a health care provider if: °· You cannot flush your port with saline as directed, or you cannot draw blood from the port. °· You have a fever or chills. °· You have more redness, swelling, or pain around your port insertion site. °· You have more fluid or blood coming from your port insertion site. °· Your port insertion site feels warm to the touch. °· You have pus or a bad smell coming from the port insertion site. °Get help right away if: °· You have chest pain or shortness of breath. °· You have bleeding from your port that you cannot control. °Summary °· Take care of the port as told by your health care provider. °· Change your dressing as told by your health care provider. °· Keep all follow-up visits as told by your health care provider. °  This information is not intended to replace advice given to you by your health care provider. Make sure you discuss any questions you have with your health care provider. °Document Released: 03/04/2013 Document Revised: 04/04/2016 Document Reviewed: 04/04/2016 °Elsevier Interactive Patient Education © 2017 [employer] °Moderate Conscious Sedation, Adult, Care After °These instructions provide you with information about caring for yourself after your procedure. Your health care provider may also give you more specific instructions. Your treatment has been planned according to current medical  practices, but problems sometimes occur. Call your health care provider if you have any problems or questions after your procedure. °What can I expect after the procedure? °After your procedure, it is common: °· To feel sleepy for several hours. °· To feel clumsy and have poor balance for several hours. °· To have poor judgment for several hours. °· To vomit if you eat too soon. ° °Follow these instructions at home: °For at least 24 hours after the procedure: ° °· Do not: °? Participate in activities where you could fall or become injured. °? Drive. °? Use heavy machinery. °? Drink alcohol. °? Take sleeping pills or medicines that cause drowsiness. °? Make important decisions or sign legal documents. °? Take care of children on your own. °· Rest. °Eating and drinking °· Follow the diet recommended by your health care provider. °· If you vomit: °? Drink water, juice, or soup when you can drink without vomiting. °? Make sure you have little or no nausea before eating solid foods. °General instructions °· Have a responsible adult stay with you until you are awake and alert. °· Take over-the-counter and prescription medicines only as told by your health care provider. °· If you smoke, do not smoke without supervision. °· Keep all follow-up visits as told by your health care provider. This is important. °Contact a health care provider if: °· You keep feeling nauseous or you keep vomiting. °· You feel light-headed. °· You develop a rash. °· You have a fever. °Get help right away if: °· You have trouble breathing. °This information is not intended to replace advice given to you by your health care provider. Make sure you discuss any questions you have with your health care provider. °Document Released: 03/04/2013 Document Revised: 10/17/2015 Document Reviewed: 09/03/2015 °Elsevier Interactive Patient Education © 2018 [employer]. ° °

## 2017-04-23 NOTE — Assessment & Plan Note (Signed)
Her cancer pain is well controlled in this visit I recommend she takes methadone on a regular basis and use Dilaudid for breakthrough pain She is advised to use laxatives on a regular basis to avoid constipation

## 2017-04-23 NOTE — Assessment & Plan Note (Signed)
I have reviewed plan of care with the patient and her husband We also discussed second opinion with her GYN oncologist, referral to [facility] or [facility] for clinical trial, etc. She has declined those options Ultimately, she is in agreement with the plan of care to try aggressive palliative chemotherapy with cisplatin and doxorubicin. From our previous discussion, she understood the goals of care is strictly palliative Echocardiogram is within normal limits She has port placement schedule Ultimate plan is to proceed with chemotherapy as scheduled end of the week and I will see her back next week for supportive care Given the aggressive nature of the chemotherapy, I recommend G-CSF support next week and she agreed

## 2017-04-23 NOTE — Assessment & Plan Note (Signed)
She has poor appetite and recent weight loss due to active disease We will get dietitian to see her in her next visit She will continue nutritional supplements as tolerated

## 2017-04-23 NOTE — Assessment & Plan Note (Signed)
The patient is aware she has incurable disease and treatment is strictly palliative. We discussed importance of Advanced Directives and Living will. We discussed the poor prognosis I addressed all her questions and concerns

## 2017-04-23 NOTE — Procedures (Signed)
RIJV PAC SVC RA EBL 0 Comp 0 

## 2017-04-23 NOTE — H&P (Signed)
Chief Complaint: endometrial cancer  Referring Physician:Dr. Heath Morris  Supervising Physician: Kerry Morris  Patient Status: [facility] - Out-pt  HPI: Kerry Morris is a 51 y.o. female who was diagnosed with endometrial cancer several years ago and underwent a hysterectomy.  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  She has currently undergone 6 cycles of chemo through [facility].  She is beginning to have pain in her veins from this and so has presented today for [facility] placement.  She denies any recent fevers, chills, SOB, CP.  She does take methadone for pain control.  Past Medical History:  Past Medical History:  Diagnosis Date  . Anxiety   . Anxiety and depression   . Cancer ([place])   . Depression   . Genetic testing 01/10/2017   Kerry Morris underwent genetic counseling and testing for hereditary cancer syndromes on 12/26/2016. Her results were negative for mutations in all 83 genes analyzed by [facility]'s 83-gene Multi-Cancers Panel. Genes analyzed include: ALK, APC, ATM, AXIN2, BAP1, BARD1, BLM, BMPR1A, BRCA1, BRCA2, BRIP1, CASR, CDC73, CDH1, CDK4, CDKN1B, CDKN1C, CDKN2A, CEBPA, CHEK2, CTNNA1, DICER1, DIS3L2, EGFR, EPCAM, F    Past Surgical History:  Past Surgical History:  Procedure Laterality Date  . CERVICAL BIOPSY  W/ LOOP ELECTRODE EXCISION    . DENTAL SURGERY      Family History:  Family History  Problem Relation Age of Onset  . Leukemia Mother 24       in remission  . Throat cancer Maternal Aunt        d.>50  . Alcohol abuse Maternal Uncle        d.>50    Social History:  reports that she quit smoking about 2 years ago. Her smoking use included cigarettes. She has a 25.00 pack-year smoking history. she has never used smokeless tobacco. She reports that she uses drugs. Drug: Marijuana. She reports that she does not drink alcohol.  Allergies: No Known  Allergies  Medications: Medications reviewed in epic   Please HPI for pertinent positives, otherwise complete 10 system ROS negative.  Mallampati Score: MD Evaluation Airway: WNL Heart: WNL Abdomen: WNL Chest/ Lungs: WNL ASA  Classification: 3 Mallampati/Airway Score: Two  Physical Exam: BP (!) 120/91   Pulse 94   Temp 98.4 F (36.9 C)   Resp 18   Ht 5' 5"  (1.651 m)   Wt 115 lb (52.2 kg)   SpO2 95%   BMI 19.14 kg/m  Body mass index is 19.14 kg/m. General: pleasant, WD, WN white female who is laying in bed in NAD HEENT: head is normocephalic, atraumatic.  Sclera are noninjected.  PERRL.  Ears and nose without any masses or lesions.  Mouth is pink and moist Heart: regular, rate, and rhythm.  Normal s1,s2. No obvious murmurs, gallops, or rubs noted.  Palpable radial and pedal pulses bilaterally Lungs: CTAB, no wheezes, rhonchi, or rales noted.  Respiratory effort nonlabored Abd: soft, mild tenderness, ND, +BS, no masses, hernias, or organomegaly Psych: A&Ox3 with an appropriate affect.   Labs: Results for orders placed or performed during the hospital encounter of 04/23/17 (from the past 48 hour(s))  APTT upon arrival     Status: None   Collection Time: 04/23/17  7:19 AM  Result Value Ref Range   aPTT 34 24 - 36 seconds  CBC upon arrival     Status: Abnormal   Collection Time: 04/23/17  7:19 AM  Result Value Ref Range   WBC 3.7 (L) 4.0 - 10.5 K/uL   RBC 3.55 (L) 3.87 - 5.11 MIL/uL   Hemoglobin 12.0 12.0 - 15.0 g/dL   HCT 35.0 (L) 36.0 - 46.0 %   MCV 98.6 78.0 - 100.0 fL   MCH 33.8 26.0 - 34.0 pg   MCHC 34.3 30.0 - 36.0 g/dL   RDW 13.6 11.5 - 15.5 %   Platelets 215 150 - 400 K/uL  Protime-INR upon arrival     Status: None   Collection Time: 04/23/17  7:19 AM  Result Value Ref Range   Prothrombin Time 12.1 11.4 - 15.2 seconds   INR 0.91     Imaging: No results found.  Assessment/Plan 1. Metastatic endometrial cancer  We will plan to place a PAC today.   Her labs and vitals have been reviewed.  Risks and benefits discussed with the patient including, but not limited to bleeding, infection, pneumothorax, or fibrin sheath development and need for additional procedures. All of the patient's questions were answered, patient is agreeable to proceed. Consent signed and in chart.  Thank you for this interesting consult.  I greatly enjoyed meeting Kerry Morris and look forward to participating in their care.  A copy of this report was sent to the requesting provider on this date.  Electronically Signed: Henreitta Morris 04/23/2017, 8:55 AM   I spent a total of  30 Minutes   in face to face in clinical consultation, greater than 50% of which was counseling/coordinating care for endometrial cancer

## 2017-04-23 NOTE — Progress Notes (Signed)
[facility] OFFICE PROGRESS NOTE  Patient Care Team: Patient, No Pcp Per as PCP - General (General Practice)  SUMMARY OF ONCOLOGIC HISTORY: Oncology History   MSI stable, Her 2 neg, ER patchy positivity     Endometrial cancer ([place])   04/05/2015 Imaging    CT abdomen:  1. Large bilateral hydrosalpinx/pyosalpinx. 2. Enlarged fibroid uterus. 3. Mild bilateral hydronephrosis from #1.       04/06/2015 Procedure    Status post CT-guided drainage of right and left tubo-ovarian abscess, with parallel drains from a right trans gluteal approach. Sample was sent from both the left and the right fluid collections to the lab.      04/19/2015 Imaging    CT abdomen and pelvis: The bilateral pelvic fluid collections have resolved following placement of the percutaneous drainage catheters. Small amount of fluid in the pelvis but no evidence for residual abscess collections or hydrosalpinx. Fibroid uterus. Innumerable uterine fibroids. Many of these fibroids are pedunculated. Development of a small left pleural effusion. Mild fat stranding in the abdominal and pelvic mesentery suggestive for mild edema      05/03/2015 Imaging    US abdomen and pelvis 1. Significantly regressed but not completely resolved bilateral hydrosalpinx/pyosalpinx. Residual is greater on the right (18 mm diameter) with mild fluid complexity. 2. Stable fibroid uterus. 3. No pelvic free fluid      07/06/2015 Pathology Results    PAP smear NEGATIVE FOR INTRAEPITHELIAL LESIONS OR MALIGNANCY.      07/18/2015 Pathology Results    Vulva, biopsy, mass - BENIGN EPIDERMAL CYST. - NO EVIDENCE OF MALIGNANCY      10/21/2016 Initial Diagnosis    She presented to the ER for severe abdominal pain      10/22/2016 Imaging    CT abdomen and pelvis 1. 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 2. Complex partially cystic mass at the left  adnexa, measuring 5.9 x 5.5 x 4.3 cm. This raises concern for primary ovarian malignancy. PET/CT could be considered for further evaluation. 3. Numerous uterine fibroids again noted, some which have increased in size. Underlying endometrial malignancy cannot be excluded. 4. Dilated bilateral fluid collections have reaccumulated within the pelvis, the largest of which measures 15 cm in length. 5. Enlarged bilateral pelvic sidewall nodes measure up to 1.6 cm in short axis, compatible with metastatic disease. Mildly prominent retroperitoneal nodes seen. Mildly prominent nodes about the left inguinal vessels. 6. 1.6 cm cyst at the medial right hepatic lobe.       10/29/2016 - 11/02/2016 Hospital Admission    She was admitted to [facility] for management of newly diagnosed endometrial cancer      10/29/2016 Tumor Marker    Patient's tumor was tested for the following markers: CA125 Results of the tumor marker test revealed 740      10/30/2016 Surgery    Procedure(s): - Open ureteral exploration - Cystourethroscopy - Left ureteral stent placement  Operative Findings:   1.) Normal cystourethrosocpy without lesions, masses or stones. Bilateral ureteral orifices identified with efflux of clear yellow urine 2.) Successful placement of left ureteral stent under visualization with appropriate curl visualized within the bladder and stent palpable within the ureter to the level of the renal pelvis 3) Open exploration determined ureter to be intact and viable.      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) - Carcinoma involves outer half of myometrium, serosal surface, and cervix - Bilateral ovaries / adnexa are positive for high grade serous carcinoma - Extensive lymphovascular space invasion is present - See  synoptic report and comment  B: "Free floating abdominal mass", removal  - Benign cyst with mesothelial-type lining, consistent with peritoneal inclusion cyst, size 7.0 cm - No malignancy identified   C: Posterior cul-de-sac mass, excision  - Involved by high grade serous carcinoma  D: Lymph nodes, left pelvic, resection  - Five of six lymph nodes involved by metastatic high grade serous carcinoma, size of metastases up to 1.5 cm, with no definite extracapsular extension identified (5/6)  E: Lymph nodes, right pelvic, excision  - Four of four lymph nodes involved by metastatic high grade serous carcinoma, size of metastases up to 1.6 cm, with no definite extracapsular extension identified (4/4)  F: Lymph nodes, right para-aortic, resection  - Three of four lymph nodes involved by metastatic high grade serous carcinoma, size of metastases up to 1.7 cm, with no definite extracapsular extension identified (3/4)  G: Omentum, omentectomy  - Involved by metastatic high grade serous carcinoma, size of nodules up to 2.9 cm   Given the presence of a large endometrial mass with serous endometrial intraepithelial carcinoma as well as deep myometrial invasion and cervical and serosal involvement, the tumor is staged as an endometrial primary. However, ovarian primary with spread to the uterus or synchronous primary tumors are also a diagnostic possibility, and definitive site of origin cannot be determined histologically. Extensive lymphovascular space invasion is present, and the tumor involves both ovaries, peritoneum, omentum, and multiple lymph nodes. No distinct fallopian tubes are identified grossly or microscopically; however, they may have been incorporated into the ovarian / adnexal tumor masses.  Immunohistochemical stains are performed on block A11 and demonstrate the tumor is positive for p53 and WT1 and is strongly diffusely positive for p16. It shows patchy focal staining for ER. WT1  staining is also positive in block A15, the right ovary. 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 Interval hysterectomy and likely oophorectomy, and possibly partial omentectomy. There is infiltration and nodularity of the upper omentum compatible with tumor along with complex density along the paracolic gutters, space of Retzius, and presacral space which may also represent tumor infiltration. 2. Soft tissue density favoring tumor along the right posterior piriformis muscle extending towards the vaginal cuff and cul-de-sac region. This likely represents tumor. 3. Stable or minimally increased retroperitoneal and pelvic adenopathy. This is thought to be malignant. 4. Reduced ascites. 5. Mild bilateral hydronephrosis and proximal hydroureter extending down to the iliac vessel cross over is were there is stranding and some mild adenopathy. This likely represents low-grade partial ureteral obstruction and is most likely from extrinsic mass effect. 6. Aortic Atherosclerosis (ICD10-I70.0) and Emphysema (ICD10-J43.9). 7. Presacral and perirectal tumor nodularity.      11/20/2016 Tumor Marker    Patient's tumor was tested for the following markers: CA-125. Results of the tumor marker test revealed 154.2.      11/29/2016 Genetic Testing    Patient has genetic testing done for MSI instability Results revealed patient has no MSI instability      11/30/2016 - 03/18/2017 Chemotherapy    She received carboplatin & Taxol x 6  cycles       01/04/2017 Genetic Testing    No mutations were identified in 83 gene analyzed for hereditary cancer risk. VUSs were noted in ATM and GPC3.       01/10/2017 Tumor Marker    Patient's tumor was tested for the following markers: CA125 Results of the tumor marker test revealed 23.5      01/30/2017 Imaging    1. Moderate to marked response to therapy. 2. Resolution  of abdominopelvic adenopathy. 3. Resolution of omental nodularity. Significantly improved peritoneal disease within the pelvis. 4. Improvement in trace right-sided caliectasis and proximal right hydroureter. No well-defined obstructive mass. 5. Age advanced aortic atherosclerosis. 6. Mild bladder wall thickening could be due to under distention and/or cystitis.      04/12/2017 Imaging    Ct abdomen and pelvis 1. Significant progression of peritoneal metastatic disease in the pelvis since 01/30/2017 CT study. Numerous deep pelvic peritoneal metastases are increased in size and number and are intimately associated with the rectosigmoid region and vaginal cuff. 2. New retroperitoneal and right pelvic nodal metastases . 3. No evidence of bowel obstruction or acute bowel inflammation. 4. Stable chronic mild right hydroureteronephrosis to the level of the distal right lumbar ureter. No discrete obstructing mass, although probably due to extrinsic compression by tumor at the level of the right pelvic ureter. 5. Aortic Atherosclerosis (ICD10-I70.0) and Emphysema (ICD10-J43.9).       04/17/2017 Imaging    ECHO LV EF: 65% -  70%      04/23/2017 Procedure    Successful 8 French right internal jugular vein power port placement with its tip at the SVC/RA junction       INTERVAL HISTORY: Please see below for problem oriented charting. She returns with her husband for further follow-up Her pain is better controlled with combination of methadone and Dilaudid for breakthrough pain She sleeps better She continues to have poor appetite She had no further weight loss No recent nausea, vomiting or constipation Her husband have multiple questions related to plan of care and treatment options  REVIEW OF SYSTEMS:   Constitutional: Denies fevers, chills or abnormal weight loss Eyes: Denies blurriness of vision Ears, nose, mouth, throat, and face: Denies mucositis or sore throat Respiratory:  Denies cough, dyspnea or wheezes Cardiovascular: Denies palpitation, chest discomfort or lower extremity swelling Gastrointestinal:  Denies nausea, heartburn or change in bowel habits Skin: Denies abnormal skin rashes Lymphatics: Denies new lymphadenopathy or easy bruising Neurological:Denies numbness, tingling or new weaknesses Behavioral/Psych: Mood is stable, no new changes  All other systems were reviewed with the patient and are negative.  I have reviewed the past medical history, past surgical history, social history and family history with the patient and they are unchanged from previous note.  ALLERGIES:  has No Known Allergies.  MEDICATIONS:  Current Outpatient Medications  Medication Sig Dispense Refill  . acetaminophen (TYLENOL) 325 MG tablet Take 650 mg by mouth every 6 (six) hours as needed.    . ALPRAZolam (XANAX) 0.5 MG tablet Take 1 tablet (0.5 mg total) 3 (three) times daily as needed by mouth for anxiety. 15 tablet 0  . HYDROmorphone (DILAUDID) 8 MG tablet Take 1 tablet (8 mg total) every 4 (four) hours as needed by mouth for severe pain. 90 tablet 0  . lidocaine-prilocaine (EMLA) cream Apply 1 application as needed topically. 30 g 6  . methadone (DOLOPHINE) 10 MG tablet Take 1 tablet (10 mg total) every 8 (eight) hours by mouth. 60 tablet  0   No current facility-administered medications for this visit.    Facility-Administered Medications Ordered in Other Visits  Medication Dose Route Frequency Provider Last Rate Last Dose  . 0.9 %  sodium chloride infusion   Intravenous Continuous Brynda Greathouse Sue-Ellen, PA      . ceFAZolin (ANCEF) 2-4 GM/100ML-% IVPB           . fentaNYL (SUBLIMAZE) 100 MCG/2ML injection           . heparin lock flush 100 UNIT/ML injection           . lidocaine (PF) (XYLOCAINE) 1 % injection           . midazolam (VERSED) 2 MG/2ML injection             PHYSICAL EXAMINATION: ECOG PERFORMANCE STATUS: 1 - Symptomatic but completely  ambulatory  Vitals:   04/22/17 1210  BP: 136/83  Pulse: 76  Resp: 18  Temp: 98.2 F (36.8 C)  SpO2: 99%   Filed Weights   04/22/17 1210  Weight: 115 lb 6.4 oz (52.3 kg)    GENERAL:alert, no distress and comfortable SKIN: skin color, texture, turgor are normal, no rashes or significant lesions EYES: normal, Conjunctiva are pink and non-injected, sclera clear Musculoskeletal:no cyanosis of digits and no clubbing  NEURO: alert & oriented x 3 with fluent speech, no focal motor/sensory deficits  LABORATORY DATA:  I have reviewed the data as listed    Component Value Date/Time   NA 135 04/23/2017 1000   NA 141 04/10/2017 1101   K 3.6 04/23/2017 1000   K 4.0 04/10/2017 1101   CL 98 (L) 04/23/2017 1000   CO2 28 04/23/2017 1000   CO2 28 04/10/2017 1101   GLUCOSE 143 (H) 04/23/2017 1000   GLUCOSE 108 04/10/2017 1101   BUN 8 04/23/2017 1000   BUN 6.6 (L) 04/10/2017 1101   CREATININE 0.67 04/23/2017 1000   CREATININE 0.8 04/10/2017 1101   CALCIUM 9.3 04/23/2017 1000   CALCIUM 10.2 04/10/2017 1101   PROT 6.6 04/23/2017 1000   PROT 7.9 04/10/2017 1101   ALBUMIN 3.6 04/23/2017 1000   ALBUMIN 4.0 04/10/2017 1101   AST 20 04/23/2017 1000   AST 19 04/10/2017 1101   ALT 17 04/23/2017 1000   ALT 13 04/10/2017 1101   ALKPHOS 71 04/23/2017 1000   ALKPHOS 74 04/10/2017 1101   BILITOT 0.5 04/23/2017 1000   BILITOT 0.24 04/10/2017 1101   GFRNONAA >60 04/23/2017 1000   GFRAA >60 04/23/2017 1000    No results found for: SPEP, UPEP  Lab Results  Component Value Date   WBC 3.7 (L) 04/23/2017   NEUTROABS 2.6 04/12/2017   HGB 12.0 04/23/2017   HCT 35.0 (L) 04/23/2017   MCV 98.6 04/23/2017   PLT 215 04/23/2017      Chemistry      Component Value Date/Time   NA 135 04/23/2017 1000   NA 141 04/10/2017 1101   K 3.6 04/23/2017 1000   K 4.0 04/10/2017 1101   CL 98 (L) 04/23/2017 1000   CO2 28 04/23/2017 1000   CO2 28 04/10/2017 1101   BUN 8 04/23/2017 1000   BUN 6.6 (L)  04/10/2017 1101   CREATININE 0.67 04/23/2017 1000   CREATININE 0.8 04/10/2017 1101      Component Value Date/Time   CALCIUM 9.3 04/23/2017 1000   CALCIUM 10.2 04/10/2017 1101   ALKPHOS 71 04/23/2017 1000   ALKPHOS 74 04/10/2017 1101   AST 20 04/23/2017 1000  AST 19 04/10/2017 1101   ALT 17 04/23/2017 1000   ALT 13 04/10/2017 1101   BILITOT 0.5 04/23/2017 1000   BILITOT 0.24 04/10/2017 1101       RADIOGRAPHIC STUDIES: I have personally reviewed the radiological images as listed and agreed with the findings in the report. Ct Abdomen Pelvis W Contrast  Result Date: 04/12/2017 CLINICAL DATA:  Endometrial cancer, most recent chemotherapy 03/18/2017. Severe abdominal pain. EXAM: CT ABDOMEN AND PELVIS WITH CONTRAST TECHNIQUE: Multidetector CT imaging of the abdomen and pelvis was performed using the standard protocol following bolus administration of intravenous contrast. CONTRAST:  50m ISOVUE-300 IOPAMIDOL (ISOVUE-300) INJECTION 61%, 34mISOVUE-300 IOPAMIDOL (ISOVUE-300) INJECTION 61% COMPARISON:  01/30/2017 CT abdomen/ pelvis. FINDINGS: Lower chest: Centrilobular emphysema. No acute abnormality at the lung bases. Hepatobiliary: Normal liver size. Simple 1.8 cm segment 4A left liver lobe cyst. No new liver lesions. Normal gallbladder with no radiopaque cholelithiasis. No biliary ductal dilatation. Pancreas: No pancreatic mass or significant duct dilation. Stable solitary 2 mm pancreatic head calcification. No peripancreatic fat stranding or fluid collections. Spleen: Normal size. No mass. Adrenals/Urinary Tract: Normal adrenals. Mild right hydroureteronephrosis to the level of the distal lumbar segment of the right ureter is not appreciably changed. No left hydronephrosis. No renal masses. Mildly distended and otherwise normal bladder. Stomach/Bowel: Grossly normal stomach. Normal caliber small bowel with no small bowel wall thickening. Normal appendix. Normal caliber large bowel. No large bowel  wall thickening or significant pericolonic fat stranding. No significant colonic diverticulosis. Oral contrast transits to the transverse colon. Vascular/Lymphatic: Atherosclerotic nonaneurysmal abdominal aorta. Patent portal, splenic, hepatic and renal veins. New left para-aortic adenopathy measuring up to 1.5 cm (series 2/image 30), previously 0.7 cm. Newly enlarged 1.3 cm right external iliac node (series 2/image 61). Reproductive: Status post hysterectomy. Numerous clustered peritoneal soft-tissue nodules throughout the bilateral deep pelvis, intimately associated with the rectosigmoid junction and vaginal cuff, increased in size and number. Largest nodule measures 3.3 x 2.6 cm (series 2/ image 64), previously 1.7 x 1.6 cm, increased. Left perirectal 1.8 x 1.3 cm nodule, previously 1.0 x 0.6 cm, increased. Left vaginal cuff 2.6 x 2.5 cm nodule (series 2/ image 66), previously 0.8 x 0.8 cm, increased. High mid pelvic 1.2 x 1.1 cm nodule (series 2/image 50), previously 0.5 x 0.4 cm, increased. Other: No pneumoperitoneum, ascites or focal fluid collection. Musculoskeletal: No aggressive appearing focal osseous lesions. Mild lumbar spondylosis. IMPRESSION: 1. Significant progression of peritoneal metastatic disease in the pelvis since 01/30/2017 CT study. Numerous deep pelvic peritoneal metastases are increased in size and number and are intimately associated with the rectosigmoid region and vaginal cuff. 2. New retroperitoneal and right pelvic nodal metastases . 3. No evidence of bowel obstruction or acute bowel inflammation. 4. Stable chronic mild right hydroureteronephrosis to the level of the distal right lumbar ureter. No discrete obstructing mass, although probably due to extrinsic compression by tumor at the level of the right pelvic ureter. 5. Aortic Atherosclerosis (ICD10-I70.0) and Emphysema (ICD10-J43.9). Electronically Signed   By: JaIlona Sorrel.D.   On: 04/12/2017 14:42   Ir UsKoreauide Vasc Access  Right  Result Date: 04/23/2017 CLINICAL DATA:  Endometrial carcinoma EXAM: TUNNEL POWER PORT PLACEMENT WITH SUBCUTANEOUS POCKET UTILIZING ULTRASOUND & FLOUROSCOPY FLUOROSCOPY TIME:  30 seconds. MEDICATIONS AND MEDICAL HISTORY: Versed 3 mg, Fentanyl 175 mcg. Additional Medications: Ancef 2 g. Antibiotics were given within 2 hours of the procedure. ANESTHESIA/SEDATION: Moderate sedation time: 27 minutes. Nursing monitored the the patient during the procedure. PROCEDURE:  After written informed consent was obtained, patient was placed in the supine position on angiographic table. The right neck and chest was prepped and draped in a sterile fashion. Lidocaine was utilized for local anesthesia. The right jugular vein was noted to be patent initially with ultrasound. Under sonographic guidance, a micropuncture needle was inserted into the right IJ vein (Ultrasound and fluoroscopic image documentation was performed). The needle was removed over an 018 wire which was exchanged for a Amplatz. This was advanced into the IVC. An 8-French dilator was advanced over the Amplatz. A small incision was made in the right upper chest over the anterior right second rib. Utilizing blunt dissection, a subcutaneous pocket was created in the caudal direction. The pocket was irrigated with a copious amount of sterile normal saline. The port catheter was tunneled from the chest incision, and out the neck incision. The reservoir was inserted into the subcutaneous pocket and secured with two 3-0 Ethilon stitches. A peel-away sheath was advanced over the Amplatz wire. The port catheter was cut to measure length and inserted through the peel-away sheath. The peel-away sheath was removed. The chest incision was closed with 3-0 Vicryl interrupted stitches for the subcutaneous tissue and a running of 4-0 Vicryl subcuticular stitch for the skin. The neck incision was closed with a 4-0 Vicryl subcuticular stitch. Derma-bond was applied to both  surgical incisions. The port reservoir was flushed and instilled with heparinized saline. No complications. FINDINGS: A right IJ vein Port-A-Cath is in place with its tip at the cavoatrial junction. COMPLICATIONS: None IMPRESSION: Successful 8 French right internal jugular vein power port placement with its tip at the SVC/RA junction. Electronically Signed   By: Marybelle Killings M.D.   On: 04/23/2017 10:25   Ir Fluoro Guide Port Insertion Right  Result Date: 04/23/2017 CLINICAL DATA:  Endometrial carcinoma EXAM: TUNNEL POWER PORT PLACEMENT WITH SUBCUTANEOUS POCKET UTILIZING ULTRASOUND & FLOUROSCOPY FLUOROSCOPY TIME:  30 seconds. MEDICATIONS AND MEDICAL HISTORY: Versed 3 mg, Fentanyl 175 mcg. Additional Medications: Ancef 2 g. Antibiotics were given within 2 hours of the procedure. ANESTHESIA/SEDATION: Moderate sedation time: 27 minutes. Nursing monitored the the patient during the procedure. PROCEDURE: After written informed consent was obtained, patient was placed in the supine position on angiographic table. The right neck and chest was prepped and draped in a sterile fashion. Lidocaine was utilized for local anesthesia. The right jugular vein was noted to be patent initially with ultrasound. Under sonographic guidance, a micropuncture needle was inserted into the right IJ vein (Ultrasound and fluoroscopic image documentation was performed). The needle was removed over an 018 wire which was exchanged for a Amplatz. This was advanced into the IVC. An 8-French dilator was advanced over the Amplatz. A small incision was made in the right upper chest over the anterior right second rib. Utilizing blunt dissection, a subcutaneous pocket was created in the caudal direction. The pocket was irrigated with a copious amount of sterile normal saline. The port catheter was tunneled from the chest incision, and out the neck incision. The reservoir was inserted into the subcutaneous pocket and secured with two 3-0 Ethilon  stitches. A peel-away sheath was advanced over the Amplatz wire. The port catheter was cut to measure length and inserted through the peel-away sheath. The peel-away sheath was removed. The chest incision was closed with 3-0 Vicryl interrupted stitches for the subcutaneous tissue and a running of 4-0 Vicryl subcuticular stitch for the skin. The neck incision was closed with a 4-0 Vicryl subcuticular stitch.  Derma-bond was applied to both surgical incisions. The port reservoir was flushed and instilled with heparinized saline. No complications. FINDINGS: A right IJ vein Port-A-Cath is in place with its tip at the cavoatrial junction. COMPLICATIONS: None IMPRESSION: Successful 8 French right internal jugular vein power port placement with its tip at the SVC/RA junction. Electronically Signed   By: Marybelle Killings M.D.   On: 04/23/2017 10:25    ASSESSMENT & PLAN:  Endometrial cancer ([place]) I have reviewed plan of care with the patient and her husband We also discussed second opinion with her GYN oncologist, referral to [facility] or [facility] for clinical trial, etc. She has declined those options Ultimately, she is in agreement with the plan of care to try aggressive palliative chemotherapy with cisplatin and doxorubicin. From our previous discussion, she understood the goals of care is strictly palliative Echocardiogram is within normal limits She has port placement schedule Ultimate plan is to proceed with chemotherapy as scheduled end of the week and I will see her back next week for supportive care Given the aggressive nature of the chemotherapy, I recommend G-CSF support next week and she agreed  Cancer associated pain Her cancer pain is well controlled in this visit I recommend she takes methadone on a regular basis and use Dilaudid for breakthrough pain She is advised to use laxatives on a regular basis to avoid constipation  Goals of care, counseling/discussion The patient is aware she has  incurable disease and treatment is strictly palliative. We discussed importance of Advanced Directives and Living will. 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 CBC with Differential/Platelet    Standing Status:   Standing    Number of Occurrences:   22    Standing Expiration Date:   04/22/2018  . Comprehensive metabolic panel    Standing Status:   Standing    Number of Occurrences:   22    Standing Expiration Date:   04/22/2018  . Magnesium    Standing Status:   Standing    Number of Occurrences:   22    Standing Expiration Date:   04/22/2018   All questions were answered. The patient knows to call the clinic with any problems, questions or concerns. No barriers to learning was detected. I spent 25 minutes counseling the patient face to face. The total time spent in the appointment was 30 minutes and more than 50% was on counseling and review of test results     Heath Lark, MD 04/23/2017 4:41 PM

## 2017-04-26 ENCOUNTER — Other Ambulatory Visit: Payer: Self-pay

## 2017-04-26 ENCOUNTER — Ambulatory Visit (HOSPITAL_BASED_OUTPATIENT_CLINIC_OR_DEPARTMENT_OTHER): Payer: Self-pay

## 2017-04-26 VITALS — BP 105/65 | HR 86 | Temp 98.6°F | Resp 18 | Wt 117.8 lb

## 2017-04-26 DIAGNOSIS — Z5111 Encounter for antineoplastic chemotherapy: Secondary | ICD-10-CM

## 2017-04-26 DIAGNOSIS — C541 Malignant neoplasm of endometrium: Secondary | ICD-10-CM

## 2017-04-26 MED ORDER — DOXORUBICIN HCL CHEMO IV INJECTION 2 MG/ML
60.0000 mg/m2 | Freq: Once | INTRAVENOUS | Status: AC
  Administered 2017-04-26: 94 mg via INTRAVENOUS
  Filled 2017-04-26: qty 47

## 2017-04-26 MED ORDER — PALONOSETRON HCL INJECTION 0.25 MG/5ML
0.2500 mg | Freq: Once | INTRAVENOUS | Status: AC
  Administered 2017-04-26: 0.25 mg via INTRAVENOUS

## 2017-04-26 MED ORDER — PROCHLORPERAZINE MALEATE 10 MG PO TABS: 10.0000 mg | ORAL_TABLET | Freq: Four times a day (QID) | ORAL | 3 refills | Status: DC | PRN

## 2017-04-26 MED ORDER — SODIUM CHLORIDE 0.9 % IV SOLN
50.0000 mg/m2 | Freq: Once | INTRAVENOUS | Status: AC
  Administered 2017-04-26: 78 mg via INTRAVENOUS
  Filled 2017-04-26: qty 78

## 2017-04-26 MED ORDER — SODIUM CHLORIDE 0.9% FLUSH
10.0000 mL | INTRAVENOUS | Status: DC | PRN
  Administered 2017-04-26: 10 mL
  Filled 2017-04-26: qty 10

## 2017-04-26 MED ORDER — PALONOSETRON HCL INJECTION 0.25 MG/5ML
INTRAVENOUS | Status: AC
  Filled 2017-04-26: qty 5

## 2017-04-26 MED ORDER — SODIUM CHLORIDE 0.9 % IV SOLN
Freq: Once | INTRAVENOUS | Status: AC
  Administered 2017-04-26: 12:00:00 via INTRAVENOUS
  Filled 2017-04-26: qty 5

## 2017-04-26 MED ORDER — FUROSEMIDE 10 MG/ML IJ SOLN
INTRAMUSCULAR | Status: AC
  Filled 2017-04-26: qty 2

## 2017-04-26 MED ORDER — MANNITOL 25 % IV SOLN
Freq: Once | INTRAVENOUS | Status: AC
  Administered 2017-04-26: 09:00:00 via INTRAVENOUS
  Filled 2017-04-26: qty 10

## 2017-04-26 MED ORDER — HEPARIN SOD (PORK) LOCK FLUSH 100 UNIT/ML IV SOLN
500.0000 [IU] | Freq: Once | INTRAVENOUS | Status: AC | PRN
  Administered 2017-04-26: 500 [IU]
  Filled 2017-04-26: qty 5

## 2017-04-26 MED ORDER — FUROSEMIDE 10 MG/ML IJ SOLN
20.0000 mg | Freq: Once | INTRAMUSCULAR | Status: AC
  Administered 2017-04-26: 20 mg via INTRAVENOUS

## 2017-04-26 MED ORDER — ONDANSETRON HCL 8 MG PO TABS: 8.0000 mg | ORAL_TABLET | Freq: Three times a day (TID) | ORAL | 3 refills | Status: DC | PRN

## 2017-04-26 MED ORDER — SODIUM CHLORIDE 0.9 % IV SOLN
Freq: Once | INTRAVENOUS | Status: AC
  Administered 2017-04-26: 09:00:00 via INTRAVENOUS

## 2017-04-26 MED FILL — ONDANSETRON HCL 8 MG TAB: 8 | 10 days supply | Qty: 30 | Fill #0

## 2017-04-26 MED FILL — PROCHLORPERAZINE 10 MG TAB: 10 | 7 days supply | Qty: 30 | Fill #0

## 2017-04-26 NOTE — Progress Notes (Signed)
Blood return noted before, every 39mls and after Adriamycin push.

## 2017-04-26 NOTE — Patient Instructions (Signed)
[facility] Discharge Instructions for Patients Receiving Chemotherapy  Today you received the following chemotherapy agents: Adriamycin and Cisplatin   To help prevent nausea and vomiting after your treatment, we encourage you to take your nausea medication as directed.    If you develop nausea and vomiting that is not controlled by your nausea medication, call the clinic.   BELOW ARE SYMPTOMS THAT SHOULD BE REPORTED IMMEDIATELY:  *FEVER GREATER THAN 100.5 F  *CHILLS WITH OR WITHOUT FEVER  NAUSEA AND VOMITING THAT IS NOT CONTROLLED WITH YOUR NAUSEA MEDICATION  *UNUSUAL SHORTNESS OF BREATH  *UNUSUAL BRUISING OR BLEEDING  TENDERNESS IN MOUTH AND THROAT WITH OR WITHOUT PRESENCE OF ULCERS  *URINARY PROBLEMS  *BOWEL PROBLEMS  UNUSUAL RASH Items with * indicate a potential emergency and should be followed up as soon as possible.  Feel free to call the clinic should you have any questions or concerns. The clinic phone number is [phone].  Please show the [place] at check-in to the Emergency Department and triage nurse.   Doxorubicin injection (Adriamycin)  What is this medicine? DOXORUBICIN (dox oh ROO bi sin) is a chemotherapy drug. It is used to treat many kinds of cancer like leukemia, lymphoma, neuroblastoma, sarcoma, and Wilms' tumor. It is also used to treat bladder cancer, breast cancer, lung cancer, ovarian cancer, stomach cancer, and thyroid cancer. This medicine may be used for other purposes; ask your health care provider or pharmacist if you have questions. COMMON BRAND NAME(S): Adriamycin, Adriamycin PFS, Adriamycin RDF, Rubex What should I tell my health care provider before I take this medicine? They need to know if you have any of these conditions: -heart disease -history of low blood counts caused by a medicine -liver disease -recent or ongoing radiation therapy -an unusual or allergic reaction to doxorubicin, other chemotherapy  agents, other medicines, foods, dyes, or preservatives -pregnant or trying to get pregnant -breast-feeding How should I use this medicine? This drug is given as an infusion into a vein. It is administered in a hospital or clinic by a specially trained health care professional. If you have pain, swelling, burning or any unusual feeling around the site of your injection, tell your health care professional right away. Talk to your pediatrician regarding the use of this medicine in children. Special care may be needed. Overdosage: If you think you have taken too much of this medicine contact a poison control center or emergency room at once. NOTE: This medicine is only for you. Do not share this medicine with others. What if I miss a dose? It is important not to miss your dose. Call your doctor or health care professional if you are unable to keep an appointment. What may interact with this medicine? This medicine may interact with the following medications: -6-mercaptopurine -paclitaxel -phenytoin -St. John's Wort -trastuzumab -verapamil This list may not describe all possible interactions. Give your health care provider a list of all the medicines, herbs, non-prescription drugs, or dietary supplements you use. Also tell them if you smoke, drink alcohol, or use illegal drugs. Some items may interact with your medicine. What should I watch for while using this medicine? This drug may make you feel generally unwell. This is not uncommon, as chemotherapy can affect healthy cells as well as cancer cells. Report any side effects. Continue your course of treatment even though you feel ill unless your doctor tells you to stop. There is a maximum amount of this medicine you should receive throughout your  life. The amount depends on the medical condition being treated and your overall health. Your doctor will watch how much of this medicine you receive in your lifetime. Tell your doctor if you have taken  this medicine before. You may need blood work done while you are taking this medicine. Your urine may turn red for a few days after your dose. This is not blood. If your urine is dark or brown, call your doctor. In some cases, you may be given additional medicines to help with side effects. Follow all directions for their use. Call your doctor or health care professional for advice if you get a fever, chills or sore throat, or other symptoms of a cold or flu. Do not treat yourself. This drug decreases your body's ability to fight infections. Try to avoid being around people who are sick. This medicine may increase your risk to bruise or bleed. Call your doctor or health care professional if you notice any unusual bleeding. Talk to your doctor about your risk of cancer. You may be more at risk for certain types of cancers if you take this medicine. Do not become pregnant while taking this medicine or for 6 months after stopping it. Women should inform their doctor if they wish to become pregnant or think they might be pregnant. Men should not father a child while taking this medicine and for 6 months after stopping it. There is a potential for serious side effects to an unborn child. Talk to your health care professional or pharmacist for more information. Do not breast-feed an infant while taking this medicine. This medicine has caused ovarian failure in some women and reduced sperm counts in some men This medicine may interfere with the ability to have a child. Talk with your doctor or health care professional if you are concerned about your fertility. What side effects may I notice from receiving this medicine? Side effects that you should report to your doctor or health care professional as soon as possible: -allergic reactions like skin rash, itching or hives, swelling of the face, lips, or tongue -breathing problems -chest pain -fast or irregular heartbeat -low blood counts - this medicine may  decrease the number of white blood cells, red blood cells and platelets. You may be at increased risk for infections and bleeding. -pain, redness, or irritation at site where injected -signs of infection - fever or chills, cough, sore throat, pain or difficulty passing urine -signs of decreased platelets or bleeding - bruising, pinpoint red spots on the skin, black, tarry stools, blood in the urine -swelling of the ankles, feet, hands -tiredness -weakness Side effects that usually do not require medical attention (report to your doctor or health care professional if they continue or are bothersome): -diarrhea -hair loss -mouth sores -nail discoloration or damage -nausea -red colored urine -vomiting This list may not describe all possible side effects. Call your doctor for medical advice about side effects. You may report side effects to FDA at 1-800-FDA-1088. Where should I keep my medicine? This drug is given in a hospital or clinic and will not be stored at home. NOTE: This sheet is a summary. It may not cover all possible information. If you have questions about this medicine, talk to your doctor, pharmacist, or health care provider.  2018 [employer] Standard (2015-07-11 11:28:51)  Cisplatin injection What is this medicine? CISPLATIN (SIS pla tin) is a chemotherapy drug. It targets fast dividing cells, like cancer cells, and causes these cells to die. This medicine  is used to treat many types of cancer like bladder, ovarian, and testicular cancers. This medicine may be used for other purposes; ask your health care provider or pharmacist if you have questions. COMMON BRAND NAME(S): Platinol, Platinol -AQ What should I tell my health care provider before I take this medicine? They need to know if you have any of these conditions: -blood disorders -hearing problems -kidney disease -recent or ongoing radiation therapy -an unusual or allergic reaction to cisplatin, carboplatin,  other chemotherapy, other medicines, foods, dyes, or preservatives -pregnant or trying to get pregnant -breast-feeding How should I use this medicine? This drug is given as an infusion into a vein. It is administered in a hospital or clinic by a specially trained health care professional. Talk to your pediatrician regarding the use of this medicine in children. Special care may be needed. Overdosage: If you think you have taken too much of this medicine contact a poison control center or emergency room at once. NOTE: This medicine is only for you. Do not share this medicine with others. What if I miss a dose? It is important not to miss a dose. Call your doctor or health care professional if you are unable to keep an appointment. What may interact with this medicine? -dofetilide -foscarnet -medicines for seizures -medicines to increase blood counts like filgrastim, pegfilgrastim, sargramostim -probenecid -pyridoxine used with altretamine -rituximab -some antibiotics like amikacin, gentamicin, neomycin, polymyxin B, streptomycin, tobramycin -sulfinpyrazone -vaccines -zalcitabine Talk to your doctor or health care professional before taking any of these medicines: -acetaminophen -aspirin -ibuprofen -ketoprofen -naproxen This list may not describe all possible interactions. Give your health care provider a list of all the medicines, herbs, non-prescription drugs, or dietary supplements you use. Also tell them if you smoke, drink alcohol, or use illegal drugs. Some items may interact with your medicine. What should I watch for while using this medicine? Your condition will be monitored carefully while you are receiving this medicine. You will need important blood work done while you are taking this medicine. This drug may make you feel generally unwell. This is not uncommon, as chemotherapy can affect healthy cells as well as cancer cells. Report any side effects. Continue your course of  treatment even though you feel ill unless your doctor tells you to stop. In some cases, you may be given additional medicines to help with side effects. Follow all directions for their use. Call your doctor or health care professional for advice if you get a fever, chills or sore throat, or other symptoms of a cold or flu. Do not treat yourself. This drug decreases your body's ability to fight infections. Try to avoid being around people who are sick. This medicine may increase your risk to bruise or bleed. Call your doctor or health care professional if you notice any unusual bleeding. Be careful brushing and flossing your teeth or using a toothpick because you may get an infection or bleed more easily. If you have any dental work done, tell your dentist you are receiving this medicine. Avoid taking products that contain aspirin, acetaminophen, ibuprofen, naproxen, or ketoprofen unless instructed by your doctor. These medicines may hide a fever. Do not become pregnant while taking this medicine. Women should inform their doctor if they wish to become pregnant or think they might be pregnant. There is a potential for serious side effects to an unborn child. Talk to your health care professional or pharmacist for more information. Do not breast-feed an infant while taking this medicine.  Drink fluids as directed while you are taking this medicine. This will help protect your kidneys. Call your doctor or health care professional if you get diarrhea. Do not treat yourself. What side effects may I notice from receiving this medicine? Side effects that you should report to your doctor or health care professional as soon as possible: -allergic reactions like skin rash, itching or hives, swelling of the face, lips, or tongue -signs of infection - fever or chills, cough, sore throat, pain or difficulty passing urine -signs of decreased platelets or bleeding - bruising, pinpoint red spots on the skin, black,  tarry stools, nosebleeds -signs of decreased red blood cells - unusually weak or tired, fainting spells, lightheadedness -breathing problems -changes in hearing -gout pain -low blood counts - This drug may decrease the number of white blood cells, red blood cells and platelets. You may be at increased risk for infections and bleeding. -nausea and vomiting -pain, swelling, redness or irritation at the injection site -pain, tingling, numbness in the hands or feet -problems with balance, movement -trouble passing urine or change in the amount of urine Side effects that usually do not require medical attention (report to your doctor or health care professional if they continue or are bothersome): -changes in vision -loss of appetite -metallic taste in the mouth or changes in taste This list may not describe all possible side effects. Call your doctor for medical advice about side effects. You may report side effects to FDA at 1-800-FDA-1088. Where should I keep my medicine? This drug is given in a hospital or clinic and will not be stored at home. NOTE: This sheet is a summary. It may not cover all possible information. If you have questions about this medicine, talk to your doctor, pharmacist, or health care provider.  2018 [employer]/Gold Standard (2007-08-19 14:40:54)

## 2017-04-29 ENCOUNTER — Telehealth: Payer: Self-pay | Admitting: Hematology and Oncology

## 2017-04-29 ENCOUNTER — Ambulatory Visit (HOSPITAL_BASED_OUTPATIENT_CLINIC_OR_DEPARTMENT_OTHER): Payer: Self-pay

## 2017-04-29 ENCOUNTER — Ambulatory Visit (HOSPITAL_BASED_OUTPATIENT_CLINIC_OR_DEPARTMENT_OTHER): Payer: Self-pay | Admitting: Hematology and Oncology

## 2017-04-29 ENCOUNTER — Telehealth: Payer: Self-pay

## 2017-04-29 ENCOUNTER — Encounter: Payer: Self-pay | Admitting: Hematology and Oncology

## 2017-04-29 DIAGNOSIS — C541 Malignant neoplasm of endometrium: Secondary | ICD-10-CM

## 2017-04-29 DIAGNOSIS — R634 Abnormal weight loss: Secondary | ICD-10-CM

## 2017-04-29 DIAGNOSIS — R1030 Lower abdominal pain, unspecified: Secondary | ICD-10-CM

## 2017-04-29 DIAGNOSIS — D701 Agranulocytosis secondary to cancer chemotherapy: Secondary | ICD-10-CM

## 2017-04-29 DIAGNOSIS — G893 Neoplasm related pain (acute) (chronic): Secondary | ICD-10-CM

## 2017-04-29 DIAGNOSIS — K5909 Other constipation: Secondary | ICD-10-CM

## 2017-04-29 MED ORDER — PEGFILGRASTIM INJECTION 6 MG/0.6ML ~~LOC~~
6.0000 mg | PREFILLED_SYRINGE | Freq: Once | SUBCUTANEOUS | Status: AC
  Administered 2017-04-29: 6 mg via SUBCUTANEOUS
  Filled 2017-04-29: qty 0.6

## 2017-04-29 MED ORDER — DEXAMETHASONE 4 MG PO TABS: 4.0000 mg | ORAL_TABLET | Freq: Every day | ORAL | 0 refills | Status: DC

## 2017-04-29 MED FILL — DEXAMETHASONE 4 MG TABLET: 4 | 60 days supply | Qty: 60 | Fill #0

## 2017-04-29 NOTE — Progress Notes (Signed)
[facility] OFFICE PROGRESS NOTE  Patient Care Team: Patient, No Pcp Per as PCP - General (General Practice)  SUMMARY OF ONCOLOGIC HISTORY: Oncology History   MSI stable, Her 2 neg, ER patchy positivity     Endometrial cancer ([place])   04/05/2015 Imaging    CT abdomen:  1. Large bilateral hydrosalpinx/pyosalpinx. 2. Enlarged fibroid uterus. 3. Mild bilateral hydronephrosis from #1.       04/06/2015 Procedure    Status post CT-guided drainage of right and left tubo-ovarian abscess, with parallel drains from a right trans gluteal approach. Sample was sent from both the left and the right fluid collections to the lab.      04/19/2015 Imaging    CT abdomen and pelvis: The bilateral pelvic fluid collections have resolved following placement of the percutaneous drainage catheters. Small amount of fluid in the pelvis but no evidence for residual abscess collections or hydrosalpinx. Fibroid uterus. Innumerable uterine fibroids. Many of these fibroids are pedunculated. Development of a small left pleural effusion. Mild fat stranding in the abdominal and pelvic mesentery suggestive for mild edema      05/03/2015 Imaging    US abdomen and pelvis 1. Significantly regressed but not completely resolved bilateral hydrosalpinx/pyosalpinx. Residual is greater on the right (18 mm diameter) with mild fluid complexity. 2. Stable fibroid uterus. 3. No pelvic free fluid      07/06/2015 Pathology Results    PAP smear NEGATIVE FOR INTRAEPITHELIAL LESIONS OR MALIGNANCY.      07/18/2015 Pathology Results    Vulva, biopsy, mass - BENIGN EPIDERMAL CYST. - NO EVIDENCE OF MALIGNANCY      10/21/2016 Initial Diagnosis    She presented to the ER for severe abdominal pain      10/22/2016 Imaging    CT abdomen and pelvis 1. 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 2. Complex partially cystic mass at the left  adnexa, measuring 5.9 x 5.5 x 4.3 cm. This raises concern for primary ovarian malignancy. PET/CT could be considered for further evaluation. 3. Numerous uterine fibroids again noted, some which have increased in size. Underlying endometrial malignancy cannot be excluded. 4. Dilated bilateral fluid collections have reaccumulated within the pelvis, the largest of which measures 15 cm in length. 5. Enlarged bilateral pelvic sidewall nodes measure up to 1.6 cm in short axis, compatible with metastatic disease. Mildly prominent retroperitoneal nodes seen. Mildly prominent nodes about the left inguinal vessels. 6. 1.6 cm cyst at the medial right hepatic lobe.       10/29/2016 - 11/02/2016 Hospital Admission    She was admitted to [facility] for management of newly diagnosed endometrial cancer      10/29/2016 Tumor Marker    Patient's tumor was tested for the following markers: CA125 Results of the tumor marker test revealed 740      10/30/2016 Surgery    Procedure(s): - Open ureteral exploration - Cystourethroscopy - Left ureteral stent placement  Operative Findings:   1.) Normal cystourethrosocpy without lesions, masses or stones. Bilateral ureteral orifices identified with efflux of clear yellow urine 2.) Successful placement of left ureteral stent under visualization with appropriate curl visualized within the bladder and stent palpable within the ureter to the level of the renal pelvis 3) Open exploration determined ureter to be intact and viable.      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) - Carcinoma involves outer half of myometrium, serosal surface, and cervix - Bilateral ovaries / adnexa are positive for high grade serous carcinoma - Extensive lymphovascular space invasion is present - See  synoptic report and comment  B: "Free floating abdominal mass", removal  - Benign cyst with mesothelial-type lining, consistent with peritoneal inclusion cyst, size 7.0 cm - No malignancy identified   C: Posterior cul-de-sac mass, excision  - Involved by high grade serous carcinoma  D: Lymph nodes, left pelvic, resection  - Five of six lymph nodes involved by metastatic high grade serous carcinoma, size of metastases up to 1.5 cm, with no definite extracapsular extension identified (5/6)  E: Lymph nodes, right pelvic, excision  - Four of four lymph nodes involved by metastatic high grade serous carcinoma, size of metastases up to 1.6 cm, with no definite extracapsular extension identified (4/4)  F: Lymph nodes, right para-aortic, resection  - Three of four lymph nodes involved by metastatic high grade serous carcinoma, size of metastases up to 1.7 cm, with no definite extracapsular extension identified (3/4)  G: Omentum, omentectomy  - Involved by metastatic high grade serous carcinoma, size of nodules up to 2.9 cm   Given the presence of a large endometrial mass with serous endometrial intraepithelial carcinoma as well as deep myometrial invasion and cervical and serosal involvement, the tumor is staged as an endometrial primary. However, ovarian primary with spread to the uterus or synchronous primary tumors are also a diagnostic possibility, and definitive site of origin cannot be determined histologically. Extensive lymphovascular space invasion is present, and the tumor involves both ovaries, peritoneum, omentum, and multiple lymph nodes. No distinct fallopian tubes are identified grossly or microscopically; however, they may have been incorporated into the ovarian / adnexal tumor masses.  Immunohistochemical stains are performed on block A11 and demonstrate the tumor is positive for p53 and WT1 and is strongly diffusely positive for p16. It shows patchy focal staining for ER. WT1  staining is also positive in block A15, the right ovary. 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 Interval hysterectomy and likely oophorectomy, and possibly partial omentectomy. There is infiltration and nodularity of the upper omentum compatible with tumor along with complex density along the paracolic gutters, space of Retzius, and presacral space which may also represent tumor infiltration. 2. Soft tissue density favoring tumor along the right posterior piriformis muscle extending towards the vaginal cuff and cul-de-sac region. This likely represents tumor. 3. Stable or minimally increased retroperitoneal and pelvic adenopathy. This is thought to be malignant. 4. Reduced ascites. 5. Mild bilateral hydronephrosis and proximal hydroureter extending down to the iliac vessel cross over is were there is stranding and some mild adenopathy. This likely represents low-grade partial ureteral obstruction and is most likely from extrinsic mass effect. 6. Aortic Atherosclerosis (ICD10-I70.0) and Emphysema (ICD10-J43.9). 7. Presacral and perirectal tumor nodularity.      11/20/2016 Tumor Marker    Patient's tumor was tested for the following markers: CA-125. Results of the tumor marker test revealed 154.2.      11/29/2016 Genetic Testing    Patient has genetic testing done for MSI instability Results revealed patient has no MSI instability      11/30/2016 - 03/18/2017 Chemotherapy    She received carboplatin & Taxol x 6  cycles       01/04/2017 Genetic Testing    No mutations were identified in 83 gene analyzed for hereditary cancer risk. VUSs were noted in ATM and GPC3.       01/10/2017 Tumor Marker    Patient's tumor was tested for the following markers: CA125 Results of the tumor marker test revealed 23.5      01/30/2017 Imaging    1. Moderate to marked response to therapy. 2. Resolution  of abdominopelvic adenopathy. 3. Resolution of omental nodularity. Significantly improved peritoneal disease within the pelvis. 4. Improvement in trace right-sided caliectasis and proximal right hydroureter. No well-defined obstructive mass. 5. Age advanced aortic atherosclerosis. 6. Mild bladder wall thickening could be due to under distention and/or cystitis.      04/12/2017 Imaging    Ct abdomen and pelvis 1. Significant progression of peritoneal metastatic disease in the pelvis since 01/30/2017 CT study. Numerous deep pelvic peritoneal metastases are increased in size and number and are intimately associated with the rectosigmoid region and vaginal cuff. 2. New retroperitoneal and right pelvic nodal metastases . 3. No evidence of bowel obstruction or acute bowel inflammation. 4. Stable chronic mild right hydroureteronephrosis to the level of the distal right lumbar ureter. No discrete obstructing mass, although probably due to extrinsic compression by tumor at the level of the right pelvic ureter. 5. Aortic Atherosclerosis (ICD10-I70.0) and Emphysema (ICD10-J43.9).       04/17/2017 Imaging    ECHO LV EF: 65% -  70%      04/23/2017 Procedure    Successful 8 French right internal jugular vein power port placement with its tip at the SVC/RA junction      04/26/2017 -  Chemotherapy    She received cisplatin and doxorubicin       INTERVAL HISTORY: Please see below for problem oriented charting. She returns for supportive care She had no bowel movement for 2 days She has stopped all narcotic prescriptions She did not take laxatives as prescribed and has poor appetite She denies peripheral neuropathy, nausea or vomiting  REVIEW OF SYSTEMS:   Constitutional: Denies fevers, chills  Eyes: Denies blurriness of vision Ears, nose, mouth, throat, and face: Denies mucositis or sore throat Respiratory: Denies cough, dyspnea or wheezes Cardiovascular: Denies palpitation, chest  discomfort or lower extremity swelling Skin: Denies abnormal skin rashes Lymphatics: Denies new lymphadenopathy or easy bruising Neurological:Denies numbness, tingling or new weaknesses Behavioral/Psych: Mood is stable, no new changes  All other systems were reviewed with the patient and are negative.  I have reviewed the past medical history, past surgical history, social history and family history with the patient and they are unchanged from previous note.  ALLERGIES:  has No Known Allergies.  MEDICATIONS:  Current Outpatient Medications  Medication Sig Dispense Refill  . acetaminophen (TYLENOL) 325 MG tablet Take 650 mg by mouth every 6 (six) hours as needed.    . ALPRAZolam (XANAX) 0.5 MG tablet Take 1 tablet (0.5 mg total) 3 (three) times daily as needed by mouth for anxiety. 15 tablet 0  . dexamethasone (DECADRON) 4 MG tablet Take 1 tablet (4 mg total) by mouth daily. 60 tablet 0  . HYDROmorphone (DILAUDID) 8 MG tablet Take 1 tablet (8 mg total) every 4 (four) hours as needed by mouth for severe pain. 90 tablet 0  . lidocaine-prilocaine (EMLA) cream Apply 1 application as needed topically. 30 g 6  . methadone (DOLOPHINE) 10 MG tablet Take 1 tablet (10 mg total) every 8 (eight) hours  by mouth. 60 tablet 0  . ondansetron (ZOFRAN) 8 MG tablet Take 1 tablet (8 mg total) by mouth every 8 (eight) hours as needed for nausea or vomiting. 30 tablet 3  . prochlorperazine (COMPAZINE) 10 MG tablet Take 1 tablet (10 mg total) by mouth every 6 (six) hours as needed for nausea or vomiting. 30 tablet 3   No current facility-administered medications for this visit.     PHYSICAL EXAMINATION: ECOG PERFORMANCE STATUS: 1 - Symptomatic but completely ambulatory  Vitals:   04/29/17 1513  BP: 116/85  Pulse: (!) 114  Resp: 20  Temp: 98.1 F (36.7 C)  SpO2: 95%   Filed Weights   04/29/17 1513  Weight: 111 lb 9.6 oz (50.6 kg)    GENERAL:alert, no distress and comfortable SKIN: skin color,  texture, turgor are normal, no rashes or significant lesions EYES: normal, Conjunctiva are pink and non-injected, sclera clear OROPHARYNX:no exudate, no erythema and lips, buccal mucosa, and tongue normal  NECK: supple, thyroid normal size, non-tender, without nodularity LYMPH:  no palpable lymphadenopathy in the cervical, axillary or inguinal LUNGS: clear to auscultation and percussion with normal breathing effort HEART: regular rate & rhythm and no murmurs and no lower extremity edema ABDOMEN:abdomen soft, non-tender and normal bowel sounds Musculoskeletal:no cyanosis of digits and no clubbing  NEURO: alert & oriented x 3 with fluent speech, no focal motor/sensory deficits  LABORATORY DATA:  I have reviewed the data as listed    Component Value Date/Time   NA 135 04/23/2017 1000   NA 141 04/10/2017 1101   K 3.6 04/23/2017 1000   K 4.0 04/10/2017 1101   CL 98 (L) 04/23/2017 1000   CO2 28 04/23/2017 1000   CO2 28 04/10/2017 1101   GLUCOSE 143 (H) 04/23/2017 1000   GLUCOSE 108 04/10/2017 1101   BUN 8 04/23/2017 1000   BUN 6.6 (L) 04/10/2017 1101   CREATININE 0.67 04/23/2017 1000   CREATININE 0.8 04/10/2017 1101   CALCIUM 9.3 04/23/2017 1000   CALCIUM 10.2 04/10/2017 1101   PROT 6.6 04/23/2017 1000   PROT 7.9 04/10/2017 1101   ALBUMIN 3.6 04/23/2017 1000   ALBUMIN 4.0 04/10/2017 1101   AST 20 04/23/2017 1000   AST 19 04/10/2017 1101   ALT 17 04/23/2017 1000   ALT 13 04/10/2017 1101   ALKPHOS 71 04/23/2017 1000   ALKPHOS 74 04/10/2017 1101   BILITOT 0.5 04/23/2017 1000   BILITOT 0.24 04/10/2017 1101   GFRNONAA >60 04/23/2017 1000   GFRAA >60 04/23/2017 1000    No results found for: SPEP, UPEP  Lab Results  Component Value Date   WBC 3.7 (L) 04/23/2017   NEUTROABS 2.6 04/12/2017   HGB 12.0 04/23/2017   HCT 35.0 (L) 04/23/2017   MCV 98.6 04/23/2017   PLT 215 04/23/2017      Chemistry      Component Value Date/Time   NA 135 04/23/2017 1000   NA 141 04/10/2017  1101   K 3.6 04/23/2017 1000   K 4.0 04/10/2017 1101   CL 98 (L) 04/23/2017 1000   CO2 28 04/23/2017 1000   CO2 28 04/10/2017 1101   BUN 8 04/23/2017 1000   BUN 6.6 (L) 04/10/2017 1101   CREATININE 0.67 04/23/2017 1000   CREATININE 0.8 04/10/2017 1101      Component Value Date/Time   CALCIUM 9.3 04/23/2017 1000   CALCIUM 10.2 04/10/2017 1101   ALKPHOS 71 04/23/2017 1000   ALKPHOS 74 04/10/2017 1101   AST 20 04/23/2017  1000   AST 19 04/10/2017 1101   ALT 17 04/23/2017 1000   ALT 13 04/10/2017 1101   BILITOT 0.5 04/23/2017 1000   BILITOT 0.24 04/10/2017 1101       RADIOGRAPHIC STUDIES: I have personally reviewed the radiological images as listed and agreed with the findings in the report. Ct Abdomen Pelvis W Contrast  Result Date: 04/12/2017 CLINICAL DATA:  Endometrial cancer, most recent chemotherapy 03/18/2017. Severe abdominal pain. EXAM: CT ABDOMEN AND PELVIS WITH CONTRAST TECHNIQUE: Multidetector CT imaging of the abdomen and pelvis was performed using the standard protocol following bolus administration of intravenous contrast. CONTRAST:  85m ISOVUE-300 IOPAMIDOL (ISOVUE-300) INJECTION 61%, 367mISOVUE-300 IOPAMIDOL (ISOVUE-300) INJECTION 61% COMPARISON:  01/30/2017 CT abdomen/ pelvis. FINDINGS: Lower chest: Centrilobular emphysema. No acute abnormality at the lung bases. Hepatobiliary: Normal liver size. Simple 1.8 cm segment 4A left liver lobe cyst. No new liver lesions. Normal gallbladder with no radiopaque cholelithiasis. No biliary ductal dilatation. Pancreas: No pancreatic mass or significant duct dilation. Stable solitary 2 mm pancreatic head calcification. No peripancreatic fat stranding or fluid collections. Spleen: Normal size. No mass. Adrenals/Urinary Tract: Normal adrenals. Mild right hydroureteronephrosis to the level of the distal lumbar segment of the right ureter is not appreciably changed. No left hydronephrosis. No renal masses. Mildly distended and otherwise  normal bladder. Stomach/Bowel: Grossly normal stomach. Normal caliber small bowel with no small bowel wall thickening. Normal appendix. Normal caliber large bowel. No large bowel wall thickening or significant pericolonic fat stranding. No significant colonic diverticulosis. Oral contrast transits to the transverse colon. Vascular/Lymphatic: Atherosclerotic nonaneurysmal abdominal aorta. Patent portal, splenic, hepatic and renal veins. New left para-aortic adenopathy measuring up to 1.5 cm (series 2/image 30), previously 0.7 cm. Newly enlarged 1.3 cm right external iliac node (series 2/image 61). Reproductive: Status post hysterectomy. Numerous clustered peritoneal soft-tissue nodules throughout the bilateral deep pelvis, intimately associated with the rectosigmoid junction and vaginal cuff, increased in size and number. Largest nodule measures 3.3 x 2.6 cm (series 2/ image 64), previously 1.7 x 1.6 cm, increased. Left perirectal 1.8 x 1.3 cm nodule, previously 1.0 x 0.6 cm, increased. Left vaginal cuff 2.6 x 2.5 cm nodule (series 2/ image 66), previously 0.8 x 0.8 cm, increased. High mid pelvic 1.2 x 1.1 cm nodule (series 2/image 50), previously 0.5 x 0.4 cm, increased. Other: No pneumoperitoneum, ascites or focal fluid collection. Musculoskeletal: No aggressive appearing focal osseous lesions. Mild lumbar spondylosis. IMPRESSION: 1. Significant progression of peritoneal metastatic disease in the pelvis since 01/30/2017 CT study. Numerous deep pelvic peritoneal metastases are increased in size and number and are intimately associated with the rectosigmoid region and vaginal cuff. 2. New retroperitoneal and right pelvic nodal metastases . 3. No evidence of bowel obstruction or acute bowel inflammation. 4. Stable chronic mild right hydroureteronephrosis to the level of the distal right lumbar ureter. No discrete obstructing mass, although probably due to extrinsic compression by tumor at the level of the right pelvic  ureter. 5. Aortic Atherosclerosis (ICD10-I70.0) and Emphysema (ICD10-J43.9). Electronically Signed   By: JaIlona Sorrel.D.   On: 04/12/2017 14:42   Ir UsKoreauide Vasc Access Right  Result Date: 04/23/2017 CLINICAL DATA:  Endometrial carcinoma EXAM: TUNNEL POWER PORT PLACEMENT WITH SUBCUTANEOUS POCKET UTILIZING ULTRASOUND & FLOUROSCOPY FLUOROSCOPY TIME:  30 seconds. MEDICATIONS AND MEDICAL HISTORY: Versed 3 mg, Fentanyl 175 mcg. Additional Medications: Ancef 2 g. Antibiotics were given within 2 hours of the procedure. ANESTHESIA/SEDATION: Moderate sedation time: 27 minutes. Nursing monitored the the patient during  the procedure. PROCEDURE: After written informed consent was obtained, patient was placed in the supine position on angiographic table. The right neck and chest was prepped and draped in a sterile fashion. Lidocaine was utilized for local anesthesia. The right jugular vein was noted to be patent initially with ultrasound. Under sonographic guidance, a micropuncture needle was inserted into the right IJ vein (Ultrasound and fluoroscopic image documentation was performed). The needle was removed over an 018 wire which was exchanged for a Amplatz. This was advanced into the IVC. An 8-French dilator was advanced over the Amplatz. A small incision was made in the right upper chest over the anterior right second rib. Utilizing blunt dissection, a subcutaneous pocket was created in the caudal direction. The pocket was irrigated with a copious amount of sterile normal saline. The port catheter was tunneled from the chest incision, and out the neck incision. The reservoir was inserted into the subcutaneous pocket and secured with two 3-0 Ethilon stitches. A peel-away sheath was advanced over the Amplatz wire. The port catheter was cut to measure length and inserted through the peel-away sheath. The peel-away sheath was removed. The chest incision was closed with 3-0 Vicryl interrupted stitches for the  subcutaneous tissue and a running of 4-0 Vicryl subcuticular stitch for the skin. The neck incision was closed with a 4-0 Vicryl subcuticular stitch. Derma-bond was applied to both surgical incisions. The port reservoir was flushed and instilled with heparinized saline. No complications. FINDINGS: A right IJ vein Port-A-Cath is in place with its tip at the cavoatrial junction. COMPLICATIONS: None IMPRESSION: Successful 8 French right internal jugular vein power port placement with its tip at the SVC/RA junction. Electronically Signed   By: Marybelle Killings M.D.   On: 04/23/2017 10:25   Ir Fluoro Guide Port Insertion Right  Result Date: 04/23/2017 CLINICAL DATA:  Endometrial carcinoma EXAM: TUNNEL POWER PORT PLACEMENT WITH SUBCUTANEOUS POCKET UTILIZING ULTRASOUND & FLOUROSCOPY FLUOROSCOPY TIME:  30 seconds. MEDICATIONS AND MEDICAL HISTORY: Versed 3 mg, Fentanyl 175 mcg. Additional Medications: Ancef 2 g. Antibiotics were given within 2 hours of the procedure. ANESTHESIA/SEDATION: Moderate sedation time: 27 minutes. Nursing monitored the the patient during the procedure. PROCEDURE: After written informed consent was obtained, patient was placed in the supine position on angiographic table. The right neck and chest was prepped and draped in a sterile fashion. Lidocaine was utilized for local anesthesia. The right jugular vein was noted to be patent initially with ultrasound. Under sonographic guidance, a micropuncture needle was inserted into the right IJ vein (Ultrasound and fluoroscopic image documentation was performed). The needle was removed over an 018 wire which was exchanged for a Amplatz. This was advanced into the IVC. An 8-French dilator was advanced over the Amplatz. A small incision was made in the right upper chest over the anterior right second rib. Utilizing blunt dissection, a subcutaneous pocket was created in the caudal direction. The pocket was irrigated with a copious amount of sterile normal  saline. The port catheter was tunneled from the chest incision, and out the neck incision. The reservoir was inserted into the subcutaneous pocket and secured with two 3-0 Ethilon stitches. A peel-away sheath was advanced over the Amplatz wire. The port catheter was cut to measure length and inserted through the peel-away sheath. The peel-away sheath was removed. The chest incision was closed with 3-0 Vicryl interrupted stitches for the subcutaneous tissue and a running of 4-0 Vicryl subcuticular stitch for the skin. The neck incision was closed with a 4-0  Vicryl subcuticular stitch. Derma-bond was applied to both surgical incisions. The port reservoir was flushed and instilled with heparinized saline. No complications. FINDINGS: A right IJ vein Port-A-Cath is in place with its tip at the cavoatrial junction. COMPLICATIONS: None IMPRESSION: Successful 8 French right internal jugular vein power port placement with its tip at the SVC/RA junction. Electronically Signed   By: Marybelle Killings M.D.   On: 04/23/2017 10:25    ASSESSMENT & PLAN:  Endometrial cancer ([place]) Her abdominal pain has resolved The patient has lost some weight due to not eating I recommend we proceed with G-CSF support I explained to the patient has been again why she is not a candidate for intraperitoneal chemotherapy  Cancer associated pain Her cancer pain has resolved I recommend she continue to take pain medicine as needed  Other constipation She has severe constipation secondary to chemotherapy and side effects of narcotic prescription I reinforced the importance of taking laxatives on a regular basis  Weight loss She has weight loss and poor appetite I recommend low-dose dexamethasone daily as appetite stimulant   No orders of the defined types were placed in this encounter.  All questions were answered. The patient knows to call the clinic with any problems, questions or concerns. No barriers to learning was detected. I  spent 15 minutes counseling the patient face to face. The total time spent in the appointment was 20 minutes and more than 50% was on counseling and review of test results     Heath Lark, MD 04/29/2017 4:49 PM

## 2017-04-29 NOTE — Assessment & Plan Note (Signed)
Her abdominal pain has resolved The patient has lost some weight due to not eating I recommend we proceed with G-CSF support I explained to the patient has been again why she is not a candidate for intraperitoneal chemotherapy

## 2017-04-29 NOTE — Assessment & Plan Note (Signed)
She has weight loss and poor appetite I recommend low-dose dexamethasone daily as appetite stimulant

## 2017-04-29 NOTE — Assessment & Plan Note (Signed)
Her cancer pain has resolved I recommend she continue to take pain medicine as needed

## 2017-04-29 NOTE — Patient Instructions (Signed)
Pegfilgrastim injection What is this medicine? PEGFILGRASTIM (PEG fil gra stim) is a long-acting granulocyte colony-stimulating factor that stimulates the growth of neutrophils, a type of white blood cell important in the body's fight against infection. It is used to reduce the incidence of fever and infection in patients with certain types of cancer who are receiving chemotherapy that affects the bone marrow, and to increase survival after being exposed to high doses of radiation. This medicine may be used for other purposes; ask your health care provider or pharmacist if you have questions. COMMON BRAND NAME(S): Neulasta What should I tell my health care provider before I take this medicine? They need to know if you have any of these conditions: -kidney disease -latex allergy -ongoing radiation therapy -sickle cell disease -skin reactions to acrylic adhesives (On-Body Injector only) -an unusual or allergic reaction to pegfilgrastim, filgrastim, other medicines, foods, dyes, or preservatives -pregnant or trying to get pregnant -breast-feeding How should I use this medicine? This medicine is for injection under the skin. If you get this medicine at home, you will be taught how to prepare and give the pre-filled syringe or how to use the On-body Injector. Refer to the patient Instructions for Use for detailed instructions. Use exactly as directed. Tell your healthcare provider immediately if you suspect that the On-body Injector may not have performed as intended or if you suspect the use of the On-body Injector resulted in a missed or partial dose. It is important that you put your used needles and syringes in a special sharps container. Do not put them in a trash can. If you do not have a sharps container, call your pharmacist or healthcare provider to get one. Talk to your pediatrician regarding the use of this medicine in children. While this drug may be prescribed for selected conditions,  precautions do apply. Overdosage: If you think you have taken too much of this medicine contact a poison control center or emergency room at once. NOTE: This medicine is only for you. Do not share this medicine with others. What if I miss a dose? It is important not to miss your dose. Call your doctor or health care professional if you miss your dose. If you miss a dose due to an On-body Injector failure or leakage, a new dose should be administered as soon as possible using a single prefilled syringe for manual use. What may interact with this medicine? Interactions have not been studied. Give your health care provider a list of all the medicines, herbs, non-prescription drugs, or dietary supplements you use. Also tell them if you smoke, drink alcohol, or use illegal drugs. Some items may interact with your medicine. This list may not describe all possible interactions. Give your health care provider a list of all the medicines, herbs, non-prescription drugs, or dietary supplements you use. Also tell them if you smoke, drink alcohol, or use illegal drugs. Some items may interact with your medicine. What should I watch for while using this medicine? You may need blood work done while you are taking this medicine. If you are going to need a MRI, CT scan, or other procedure, tell your doctor that you are using this medicine (On-Body Injector only). What side effects may I notice from receiving this medicine? Side effects that you should report to your doctor or health care professional as soon as possible: -allergic reactions like skin rash, itching or hives, swelling of the face, lips, or tongue -dizziness -fever -pain, redness, or irritation at site   where injected -pinpoint red spots on the skin -red or dark-brown urine -shortness of breath or breathing problems -stomach or side pain, or pain at the shoulder -swelling -tiredness -trouble passing urine or change in the amount of urine Side  effects that usually do not require medical attention (report to your doctor or health care professional if they continue or are bothersome): -bone pain -muscle pain This list may not describe all possible side effects. Call your doctor for medical advice about side effects. You may report side effects to FDA at 1-800-FDA-1088. Where should I keep my medicine? Keep out of the reach of children. Store pre-filled syringes in a refrigerator between 2 and 8 degrees C (36 and 46 degrees F). Do not freeze. Keep in carton to protect from light. Throw away this medicine if it is left out of the refrigerator for more than 48 hours. Throw away any unused medicine after the expiration date. NOTE: This sheet is a summary. It may not cover all possible information. If you have questions about this medicine, talk to your doctor, pharmacist, or health care provider.  2018 [employer] (2016-05-10 12:58:03)  

## 2017-04-29 NOTE — Telephone Encounter (Signed)
Scheduled appt per 1/23 los - Gave patient AVS and calender per los.  

## 2017-04-29 NOTE — Telephone Encounter (Signed)
Called and left message that I was calling to see how she is doing after new treatment on Friday. Instructed to call if needed.

## 2017-04-29 NOTE — Telephone Encounter (Signed)
-----   Message from Beatriz Chancellor, RN sent at 04/26/2017  2:55 PM EST ----- Regarding: Dr. Harlon Ditty Follow UP Patient of Dr. Alvy Bimler first time Adriamycin and Cisplatin. Pt tolerated well.

## 2017-04-29 NOTE — Assessment & Plan Note (Signed)
She has severe constipation secondary to chemotherapy and side effects of narcotic prescription I reinforced the importance of taking laxatives on a regular basis

## 2017-05-02 ENCOUNTER — Ambulatory Visit (HOSPITAL_COMMUNITY): Payer: Self-pay

## 2017-05-03 ENCOUNTER — Ambulatory Visit: Payer: Self-pay | Admitting: Gynecology

## 2017-05-08 ENCOUNTER — Ambulatory Visit: Payer: Self-pay | Admitting: Hematology and Oncology

## 2017-05-09 ENCOUNTER — Telehealth: Payer: Self-pay

## 2017-05-09 NOTE — Telephone Encounter (Signed)
Called regarding missed appt yesterday. She is doing good. She thought the appt was next week. Instructed per Dr. Alvy Bimler that there is no need to reschedule yesterdays appt. We will see her on 12/21 appts. Verbalized understanding.

## 2017-05-17 ENCOUNTER — Encounter: Payer: Self-pay | Admitting: Hematology and Oncology

## 2017-05-17 ENCOUNTER — Ambulatory Visit (HOSPITAL_BASED_OUTPATIENT_CLINIC_OR_DEPARTMENT_OTHER): Payer: Self-pay

## 2017-05-17 ENCOUNTER — Telehealth: Payer: Self-pay | Admitting: Hematology and Oncology

## 2017-05-17 ENCOUNTER — Ambulatory Visit (HOSPITAL_BASED_OUTPATIENT_CLINIC_OR_DEPARTMENT_OTHER): Payer: Self-pay | Admitting: Hematology and Oncology

## 2017-05-17 ENCOUNTER — Other Ambulatory Visit (HOSPITAL_BASED_OUTPATIENT_CLINIC_OR_DEPARTMENT_OTHER): Payer: Self-pay

## 2017-05-17 DIAGNOSIS — K5909 Other constipation: Secondary | ICD-10-CM

## 2017-05-17 DIAGNOSIS — R1030 Lower abdominal pain, unspecified: Secondary | ICD-10-CM

## 2017-05-17 DIAGNOSIS — Z5111 Encounter for antineoplastic chemotherapy: Secondary | ICD-10-CM

## 2017-05-17 DIAGNOSIS — C541 Malignant neoplasm of endometrium: Secondary | ICD-10-CM

## 2017-05-17 DIAGNOSIS — G893 Neoplasm related pain (acute) (chronic): Secondary | ICD-10-CM

## 2017-05-17 DIAGNOSIS — Z452 Encounter for adjustment and management of vascular access device: Secondary | ICD-10-CM

## 2017-05-17 LAB — COMPREHENSIVE METABOLIC PANEL
ALBUMIN: 3.6 g/dL (ref 3.5–5.0)
ALK PHOS: 85 U/L (ref 40–150)
ALT: 44 U/L (ref 0–55)
AST: 48 U/L — AB (ref 5–34)
Anion Gap: 10 mEq/L (ref 3–11)
BUN: 19.7 mg/dL (ref 7.0–26.0)
CALCIUM: 9.1 mg/dL (ref 8.4–10.4)
CO2: 27 mEq/L (ref 22–29)
CREATININE: 0.7 mg/dL (ref 0.6–1.1)
Chloride: 102 mEq/L (ref 98–109)
EGFR: >60 mL/min/{1.73_m2} (ref >60)
GLUCOSE: 105 mg/dL (ref 70–140)
POTASSIUM: 3.8 meq/L (ref 3.5–5.1)
SODIUM: 140 meq/L (ref 136–145)
TOTAL PROTEIN: 6.8 g/dL (ref 6.4–8.3)

## 2017-05-17 LAB — CBC WITH DIFFERENTIAL/PLATELET
BASO%: 0.1 % (ref 0.0–2.0)
Basophils Absolute: 0 10*3/uL (ref 0.0–0.1)
EOS%: 0 % (ref 0.0–7.0)
Eosinophils Absolute: 0 10*3/uL (ref 0.0–0.5)
HCT: 30.8 % — ABNORMAL LOW (ref 34.8–46.6)
HGB: 10.2 g/dL — ABNORMAL LOW (ref 11.6–15.9)
LYMPH%: 33.8 % (ref 14.0–49.7)
MCH: 33.8 pg (ref 25.1–34.0)
MCHC: 33.1 g/dL (ref 31.5–36.0)
MCV: 102 fL — ABNORMAL HIGH (ref 79.5–101.0)
MONO#: 1 10*3/uL — AB (ref 0.1–0.9)
MONO%: 10.4 % (ref 0.0–14.0)
NEUT#: 5.2 10*3/uL (ref 1.5–6.5)
NEUT%: 55.7 % (ref 38.4–76.8)
PLATELETS: 203 10*3/uL (ref 145–400)
RBC: 3.02 10*6/uL — AB (ref 3.70–5.45)
RDW: 14.5 % (ref 11.2–14.5)
WBC: 9.3 10*3/uL (ref 3.9–10.3)
lymph#: 3.2 10*3/uL (ref 0.9–3.3)

## 2017-05-17 LAB — MAGNESIUM: Magnesium: 2 mg/dl (ref 1.5–2.5)

## 2017-05-17 MED ORDER — SODIUM CHLORIDE 0.9 % IV SOLN
50.0000 mg/m2 | Freq: Once | INTRAVENOUS | Status: AC
  Administered 2017-05-17: 78 mg via INTRAVENOUS
  Filled 2017-05-17: qty 78

## 2017-05-17 MED ORDER — PALONOSETRON HCL INJECTION 0.25 MG/5ML
0.2500 mg | Freq: Once | INTRAVENOUS | Status: AC
  Administered 2017-05-17: 0.25 mg via INTRAVENOUS

## 2017-05-17 MED ORDER — HEPARIN SOD (PORK) LOCK FLUSH 100 UNIT/ML IV SOLN
500.0000 [IU] | Freq: Once | INTRAVENOUS | Status: AC | PRN
  Administered 2017-05-17: 500 [IU]
  Filled 2017-05-17: qty 5

## 2017-05-17 MED ORDER — HYDROMORPHONE HCL 8 MG PO TABS
8.0000 mg | ORAL_TABLET | ORAL | 0 refills | Status: DC | PRN
Start: 2017-05-17 — End: 2017-06-24

## 2017-05-17 MED ORDER — SODIUM CHLORIDE 0.9 % IV SOLN
Freq: Once | INTRAVENOUS | Status: AC
  Administered 2017-05-17: 13:00:00 via INTRAVENOUS
  Filled 2017-05-17: qty 5

## 2017-05-17 MED ORDER — SODIUM CHLORIDE 0.9 % IV SOLN
Freq: Once | INTRAVENOUS | Status: AC
  Administered 2017-05-17: 10:00:00 via INTRAVENOUS

## 2017-05-17 MED ORDER — DOXORUBICIN HCL CHEMO IV INJECTION 2 MG/ML
60.0000 mg/m2 | Freq: Once | INTRAVENOUS | Status: AC
  Administered 2017-05-17: 94 mg via INTRAVENOUS
  Filled 2017-05-17: qty 47

## 2017-05-17 MED ORDER — SODIUM CHLORIDE 0.9% FLUSH
10.0000 mL | Freq: Once | INTRAVENOUS | Status: AC
  Administered 2017-05-17: 10 mL
  Filled 2017-05-17: qty 10

## 2017-05-17 MED ORDER — SODIUM CHLORIDE 0.9% FLUSH
10.0000 mL | INTRAVENOUS | Status: DC | PRN
  Filled 2017-05-17: qty 10

## 2017-05-17 MED ORDER — PALONOSETRON HCL INJECTION 0.25 MG/5ML
INTRAVENOUS | Status: AC
  Filled 2017-05-17: qty 5

## 2017-05-17 MED ORDER — ALPRAZOLAM 0.5 MG PO TABS: 0.5000 mg | ORAL_TABLET | Freq: Two times a day (BID) | ORAL | 0 refills | Status: DC | PRN

## 2017-05-17 MED ORDER — POTASSIUM CHLORIDE 2 MEQ/ML IV SOLN
Freq: Once | INTRAVENOUS | Status: AC
  Administered 2017-05-17: 11:00:00 via INTRAVENOUS
  Filled 2017-05-17: qty 10

## 2017-05-17 MED ORDER — METHADONE HCL 10 MG PO TABS: 10.0000 mg | ORAL_TABLET | Freq: Three times a day (TID) | ORAL | 0 refills | Status: DC

## 2017-05-17 MED FILL — METHADONE HCL 10 MG TABLET: 10 | 20 days supply | Qty: 60 | Fill #0

## 2017-05-17 MED FILL — ALPRAZolam 0.5 MG TABS: 0.5 | 30 days supply | Qty: 60 | Fill #0

## 2017-05-17 MED FILL — HYDROmorphone HCL 8 MG TABS: 8 | 15 days supply | Qty: 90 | Fill #0

## 2017-05-17 NOTE — Assessment & Plan Note (Addendum)
She tolerated treatment very well without major side effects She has gained some weight We will proceed with cycle 2 without dose adjustment I plan to repeat imaging study after 3 cycles of chemotherapy I recommend reducing dexamethasone to 2 mg daily and then to reduce it to 2 mg every other day starting May 28, 2017 Her husband has done considerable research He has heard of a lot of wonderful alternative treatment in other countries that has high cure rate but are not approved to be used this cancer and wondering why that has not been tried I told the patient and her husband to bring me copies of research data related to all these alternative treatment I told him most of those treatments are not indicated in her situation due to different cancer diagnosis and different situation I also recommend second opinion elsewhere if they are interested

## 2017-05-17 NOTE — Progress Notes (Signed)
Urine output over 300. Pharmacy notified.

## 2017-05-17 NOTE — Progress Notes (Signed)
[facility] OFFICE PROGRESS NOTE  Patient Care Team: Patient, No Pcp Per as PCP - General (General Practice)  SUMMARY OF ONCOLOGIC HISTORY: Oncology History   MSI stable, Her 2 neg, ER patchy positivity     Endometrial cancer ([place])   04/05/2015 Imaging    CT abdomen:  1. Large bilateral hydrosalpinx/pyosalpinx. 2. Enlarged fibroid uterus. 3. Mild bilateral hydronephrosis from #1.       04/06/2015 Procedure    Status post CT-guided drainage of right and left tubo-ovarian abscess, with parallel drains from a right trans gluteal approach. Sample was sent from both the left and the right fluid collections to the lab.      04/19/2015 Imaging    CT abdomen and pelvis: The bilateral pelvic fluid collections have resolved following placement of the percutaneous drainage catheters. Small amount of fluid in the pelvis but no evidence for residual abscess collections or hydrosalpinx. Fibroid uterus. Innumerable uterine fibroids. Many of these fibroids are pedunculated. Development of a small left pleural effusion. Mild fat stranding in the abdominal and pelvic mesentery suggestive for mild edema      05/03/2015 Imaging    US abdomen and pelvis 1. Significantly regressed but not completely resolved bilateral hydrosalpinx/pyosalpinx. Residual is greater on the right (18 mm diameter) with mild fluid complexity. 2. Stable fibroid uterus. 3. No pelvic free fluid      07/06/2015 Pathology Results    PAP smear NEGATIVE FOR INTRAEPITHELIAL LESIONS OR MALIGNANCY.      07/18/2015 Pathology Results    Vulva, biopsy, mass - BENIGN EPIDERMAL CYST. - NO EVIDENCE OF MALIGNANCY      10/21/2016 Initial Diagnosis    She presented to the ER for severe abdominal pain      10/22/2016 Imaging    CT abdomen and pelvis 1. 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 2. Complex partially cystic mass at the left  adnexa, measuring 5.9 x 5.5 x 4.3 cm. This raises concern for primary ovarian malignancy. PET/CT could be considered for further evaluation. 3. Numerous uterine fibroids again noted, some which have increased in size. Underlying endometrial malignancy cannot be excluded. 4. Dilated bilateral fluid collections have reaccumulated within the pelvis, the largest of which measures 15 cm in length. 5. Enlarged bilateral pelvic sidewall nodes measure up to 1.6 cm in short axis, compatible with metastatic disease. Mildly prominent retroperitoneal nodes seen. Mildly prominent nodes about the left inguinal vessels. 6. 1.6 cm cyst at the medial right hepatic lobe.       10/29/2016 - 11/02/2016 Hospital Admission    She was admitted to [facility] for management of newly diagnosed endometrial cancer      10/29/2016 Tumor Marker    Patient's tumor was tested for the following markers: CA125 Results of the tumor marker test revealed 740      10/30/2016 Surgery    Procedure(s): - Open ureteral exploration - Cystourethroscopy - Left ureteral stent placement  Operative Findings:   1.) Normal cystourethrosocpy without lesions, masses or stones. Bilateral ureteral orifices identified with efflux of clear yellow urine 2.) Successful placement of left ureteral stent under visualization with appropriate curl visualized within the bladder and stent palpable within the ureter to the level of the renal pelvis 3) Open exploration determined ureter to be intact and viable.      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) - Carcinoma involves outer half of myometrium, serosal surface, and cervix - Bilateral ovaries / adnexa are positive for high grade serous carcinoma - Extensive lymphovascular space invasion is present - See  synoptic report and comment  B: "Free floating abdominal mass", removal  - Benign cyst with mesothelial-type lining, consistent with peritoneal inclusion cyst, size 7.0 cm - No malignancy identified   C: Posterior cul-de-sac mass, excision  - Involved by high grade serous carcinoma  D: Lymph nodes, left pelvic, resection  - Five of six lymph nodes involved by metastatic high grade serous carcinoma, size of metastases up to 1.5 cm, with no definite extracapsular extension identified (5/6)  E: Lymph nodes, right pelvic, excision  - Four of four lymph nodes involved by metastatic high grade serous carcinoma, size of metastases up to 1.6 cm, with no definite extracapsular extension identified (4/4)  F: Lymph nodes, right para-aortic, resection  - Three of four lymph nodes involved by metastatic high grade serous carcinoma, size of metastases up to 1.7 cm, with no definite extracapsular extension identified (3/4)  G: Omentum, omentectomy  - Involved by metastatic high grade serous carcinoma, size of nodules up to 2.9 cm   Given the presence of a large endometrial mass with serous endometrial intraepithelial carcinoma as well as deep myometrial invasion and cervical and serosal involvement, the tumor is staged as an endometrial primary. However, ovarian primary with spread to the uterus or synchronous primary tumors are also a diagnostic possibility, and definitive site of origin cannot be determined histologically. Extensive lymphovascular space invasion is present, and the tumor involves both ovaries, peritoneum, omentum, and multiple lymph nodes. No distinct fallopian tubes are identified grossly or microscopically; however, they may have been incorporated into the ovarian / adnexal tumor masses.  Immunohistochemical stains are performed on block A11 and demonstrate the tumor is positive for p53 and WT1 and is strongly diffusely positive for p16. It shows patchy focal staining for ER. WT1  staining is also positive in block A15, the right ovary. 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 Interval hysterectomy and likely oophorectomy, and possibly partial omentectomy. There is infiltration and nodularity of the upper omentum compatible with tumor along with complex density along the paracolic gutters, space of Retzius, and presacral space which may also represent tumor infiltration. 2. Soft tissue density favoring tumor along the right posterior piriformis muscle extending towards the vaginal cuff and cul-de-sac region. This likely represents tumor. 3. Stable or minimally increased retroperitoneal and pelvic adenopathy. This is thought to be malignant. 4. Reduced ascites. 5. Mild bilateral hydronephrosis and proximal hydroureter extending down to the iliac vessel cross over is were there is stranding and some mild adenopathy. This likely represents low-grade partial ureteral obstruction and is most likely from extrinsic mass effect. 6. Aortic Atherosclerosis (ICD10-I70.0) and Emphysema (ICD10-J43.9). 7. Presacral and perirectal tumor nodularity.      11/20/2016 Tumor Marker    Patient's tumor was tested for the following markers: CA-125. Results of the tumor marker test revealed 154.2.      11/29/2016 Genetic Testing    Patient has genetic testing done for MSI instability Results revealed patient has no MSI instability      11/30/2016 - 03/18/2017 Chemotherapy    She received carboplatin & Taxol x 6  cycles       01/04/2017 Genetic Testing    No mutations were identified in 83 gene analyzed for hereditary cancer risk. VUSs were noted in ATM and GPC3.       01/10/2017 Tumor Marker    Patient's tumor was tested for the following markers: CA125 Results of the tumor marker test revealed 23.5      01/30/2017 Imaging    1. Moderate to marked response to therapy. 2. Resolution  of abdominopelvic adenopathy. 3. Resolution of omental nodularity. Significantly improved peritoneal disease within the pelvis. 4. Improvement in trace right-sided caliectasis and proximal right hydroureter. No well-defined obstructive mass. 5. Age advanced aortic atherosclerosis. 6. Mild bladder wall thickening could be due to under distention and/or cystitis.      04/12/2017 Imaging    Ct abdomen and pelvis 1. Significant progression of peritoneal metastatic disease in the pelvis since 01/30/2017 CT study. Numerous deep pelvic peritoneal metastases are increased in size and number and are intimately associated with the rectosigmoid region and vaginal cuff. 2. New retroperitoneal and right pelvic nodal metastases . 3. No evidence of bowel obstruction or acute bowel inflammation. 4. Stable chronic mild right hydroureteronephrosis to the level of the distal right lumbar ureter. No discrete obstructing mass, although probably due to extrinsic compression by tumor at the level of the right pelvic ureter. 5. Aortic Atherosclerosis (ICD10-I70.0) and Emphysema (ICD10-J43.9).       04/17/2017 Imaging    ECHO LV EF: 65% -  70%      04/23/2017 Procedure    Successful 8 French right internal jugular vein power port placement with its tip at the SVC/RA junction      04/26/2017 -  Chemotherapy    She received cisplatin and doxorubicin       INTERVAL HISTORY: Please see below for problem oriented charting. She returns for cycle 2 of treatment She missed last week's appointment but did not feel bad enough that warranted additional visit Her husband asked me about opinions related to alternative treatment and all the other wonderful treatment approved in other countries with high cure rates but none of them were standard of care and were not approved for her condition In the meantime, she has started to gain weight with regular dexamethasone therapy She denies nausea or severe  constipation Her pain is under excellent control and she uses breakthrough pain medicine rarely She denies peripheral neuropathy  REVIEW OF SYSTEMS:   Constitutional: Denies fevers, chills or abnormal weight loss Eyes: Denies blurriness of vision Ears, nose, mouth, throat, and face: Denies mucositis or sore throat Respiratory: Denies cough, dyspnea or wheezes Cardiovascular: Denies palpitation, chest discomfort or lower extremity swelling Gastrointestinal:  Denies nausea, heartburn or change in bowel habits Skin: Denies abnormal skin rashes Lymphatics: Denies new lymphadenopathy or easy bruising Neurological:Denies numbness, tingling or new weaknesses Behavioral/Psych: Mood is stable, no new changes  All other systems were reviewed with the patient and are negative.  I have reviewed the past medical history, past surgical history, social history and family history with the patient and they are unchanged from previous note.  ALLERGIES:  has No Known Allergies.  MEDICATIONS:  Current Outpatient Medications  Medication Sig Dispense Refill  . acetaminophen (TYLENOL) 325 MG tablet Take 650 mg by mouth every 6 (six) hours as needed.    . ALPRAZolam (XANAX) 0.5 MG tablet Take 1 tablet (0.5 mg total) by mouth 2 (two) times daily as needed for anxiety. 60 tablet 0  . dexamethasone (DECADRON)  4 MG tablet Take 1 tablet (4 mg total) by mouth daily. 60 tablet 0  . HYDROmorphone (DILAUDID) 8 MG tablet Take 1 tablet (8 mg total) by mouth every 4 (four) hours as needed for severe pain. 90 tablet 0  . lidocaine-prilocaine (EMLA) cream Apply 1 application as needed topically. 30 g 6  . methadone (DOLOPHINE) 10 MG tablet Take 1 tablet (10 mg total) by mouth every 8 (eight) hours. 60 tablet 0  . ondansetron (ZOFRAN) 8 MG tablet Take 1 tablet (8 mg total) by mouth every 8 (eight) hours as needed for nausea or vomiting. 30 tablet 3  . prochlorperazine (COMPAZINE) 10 MG tablet Take 1 tablet (10 mg total) by  mouth every 6 (six) hours as needed for nausea or vomiting. 30 tablet 3   No current facility-administered medications for this visit.    Facility-Administered Medications Ordered in Other Visits  Medication Dose Route Frequency Provider Last Rate Last Dose  . CISplatin (PLATINOL) 78 mg in sodium chloride 0.9 % 250 mL chemo infusion  50 mg/m2 (Treatment Plan Recorded) Intravenous Once Alvy Bimler, Ladale Sherburn, MD      . heparin lock flush 100 unit/mL  500 Units Intracatheter Once PRN Alvy Bimler, Ameen Mostafa, MD      . sodium chloride flush (NS) 0.9 % injection 10 mL  10 mL Intracatheter PRN Alvy Bimler, Aliviyah Malanga, MD        PHYSICAL EXAMINATION: ECOG PERFORMANCE STATUS: 1 - Symptomatic but completely ambulatory  Vitals:   05/17/17 0820  BP: 115/78  Pulse: 92  Resp: 18  Temp: 98.7 F (37.1 C)  SpO2: 100%   Filed Weights   05/17/17 0820  Weight: 117 lb 1.6 oz (53.1 kg)    GENERAL:alert, no distress and comfortable SKIN: skin color, texture, turgor are normal, no rashes or significant lesions EYES: normal, Conjunctiva are pink and non-injected, sclera clear OROPHARYNX:no exudate, no erythema and lips, buccal mucosa, and tongue normal  NECK: supple, thyroid normal size, non-tender, without nodularity LYMPH:  no palpable lymphadenopathy in the cervical, axillary or inguinal LUNGS: clear to auscultation and percussion with normal breathing effort HEART: regular rate & rhythm and no murmurs and no lower extremity edema ABDOMEN:abdomen soft, non-tender and normal bowel sounds Musculoskeletal:no cyanosis of digits and no clubbing  NEURO: alert & oriented x 3 with fluent speech, no focal motor/sensory deficits  LABORATORY DATA:  I have reviewed the data as listed    Component Value Date/Time   NA 140 05/17/2017 0803   K 3.8 05/17/2017 0803   CL 98 (L) 04/23/2017 1000   CO2 27 05/17/2017 0803   GLUCOSE 105 05/17/2017 0803   BUN 19.7 05/17/2017 0803   CREATININE 0.7 05/17/2017 0803   CALCIUM 9.1 05/17/2017 0803    PROT 6.8 05/17/2017 0803   ALBUMIN 3.6 05/17/2017 0803   AST 48 (H) 05/17/2017 0803   ALT 44 05/17/2017 0803   ALKPHOS 85 05/17/2017 0803   BILITOT <0.22 05/17/2017 0803   GFRNONAA >60 04/23/2017 1000   GFRAA >60 04/23/2017 1000    No results found for: SPEP, UPEP  Lab Results  Component Value Date   WBC 9.3 05/17/2017   NEUTROABS 5.2 05/17/2017   HGB 10.2 (L) 05/17/2017   HCT 30.8 (L) 05/17/2017   MCV 102.0 (H) 05/17/2017   PLT 203 05/17/2017      Chemistry      Component Value Date/Time   NA 140 05/17/2017 0803   K 3.8 05/17/2017 0803   CL 98 (L) 04/23/2017 1000  CO2 27 05/17/2017 0803   BUN 19.7 05/17/2017 0803   CREATININE 0.7 05/17/2017 0803      Component Value Date/Time   CALCIUM 9.1 05/17/2017 0803   ALKPHOS 85 05/17/2017 0803   AST 48 (H) 05/17/2017 0803   ALT 44 05/17/2017 0803   BILITOT <0.22 05/17/2017 0803       RADIOGRAPHIC STUDIES: I have personally reviewed the radiological images as listed and agreed with the findings in the report. Ir US Guide Vasc Access Right  Result Date: 04/23/2017 CLINICAL DATA:  Endometrial carcinoma EXAM: TUNNEL POWER PORT PLACEMENT WITH SUBCUTANEOUS POCKET UTILIZING ULTRASOUND & FLOUROSCOPY FLUOROSCOPY TIME:  30 seconds. MEDICATIONS AND MEDICAL HISTORY: Versed 3 mg, Fentanyl 175 mcg. Additional Medications: Ancef 2 g. Antibiotics were given within 2 hours of the procedure. ANESTHESIA/SEDATION: Moderate sedation time: 27 minutes. Nursing monitored the the patient during the procedure. PROCEDURE: After written informed consent was obtained, patient was placed in the supine position on angiographic table. The right neck and chest was prepped and draped in a sterile fashion. Lidocaine was utilized for local anesthesia. The right jugular vein was noted to be patent initially with ultrasound. Under sonographic guidance, a micropuncture needle was inserted into the right IJ vein (Ultrasound and fluoroscopic image documentation was  performed). The needle was removed over an 018 wire which was exchanged for a Amplatz. This was advanced into the IVC. An 8-French dilator was advanced over the Amplatz. A small incision was made in the right upper chest over the anterior right second rib. Utilizing blunt dissection, a subcutaneous pocket was created in the caudal direction. The pocket was irrigated with a copious amount of sterile normal saline. The port catheter was tunneled from the chest incision, and out the neck incision. The reservoir was inserted into the subcutaneous pocket and secured with two 3-0 Ethilon stitches. A peel-away sheath was advanced over the Amplatz wire. The port catheter was cut to measure length and inserted through the peel-away sheath. The peel-away sheath was removed. The chest incision was closed with 3-0 Vicryl interrupted stitches for the subcutaneous tissue and a running of 4-0 Vicryl subcuticular stitch for the skin. The neck incision was closed with a 4-0 Vicryl subcuticular stitch. Derma-bond was applied to both surgical incisions. The port reservoir was flushed and instilled with heparinized saline. No complications. FINDINGS: A right IJ vein Port-A-Cath is in place with its tip at the cavoatrial junction. COMPLICATIONS: None IMPRESSION: Successful 8 French right internal jugular vein power port placement with its tip at the SVC/RA junction. Electronically Signed   By: Marybelle Killings M.D.   On: 04/23/2017 10:25   Ir Fluoro Guide Port Insertion Right  Result Date: 04/23/2017 CLINICAL DATA:  Endometrial carcinoma EXAM: TUNNEL POWER PORT PLACEMENT WITH SUBCUTANEOUS POCKET UTILIZING ULTRASOUND & FLOUROSCOPY FLUOROSCOPY TIME:  30 seconds. MEDICATIONS AND MEDICAL HISTORY: Versed 3 mg, Fentanyl 175 mcg. Additional Medications: Ancef 2 g. Antibiotics were given within 2 hours of the procedure. ANESTHESIA/SEDATION: Moderate sedation time: 27 minutes. Nursing monitored the the patient during the procedure. PROCEDURE:  After written informed consent was obtained, patient was placed in the supine position on angiographic table. The right neck and chest was prepped and draped in a sterile fashion. Lidocaine was utilized for local anesthesia. The right jugular vein was noted to be patent initially with ultrasound. Under sonographic guidance, a micropuncture needle was inserted into the right IJ vein (Ultrasound and fluoroscopic image documentation was performed). The needle was removed over an 018 wire which was  exchanged for a Amplatz. This was advanced into the IVC. An 8-French dilator was advanced over the Amplatz. A small incision was made in the right upper chest over the anterior right second rib. Utilizing blunt dissection, a subcutaneous pocket was created in the caudal direction. The pocket was irrigated with a copious amount of sterile normal saline. The port catheter was tunneled from the chest incision, and out the neck incision. The reservoir was inserted into the subcutaneous pocket and secured with two 3-0 Ethilon stitches. A peel-away sheath was advanced over the Amplatz wire. The port catheter was cut to measure length and inserted through the peel-away sheath. The peel-away sheath was removed. The chest incision was closed with 3-0 Vicryl interrupted stitches for the subcutaneous tissue and a running of 4-0 Vicryl subcuticular stitch for the skin. The neck incision was closed with a 4-0 Vicryl subcuticular stitch. Derma-bond was applied to both surgical incisions. The port reservoir was flushed and instilled with heparinized saline. No complications. FINDINGS: A right IJ vein Port-A-Cath is in place with its tip at the cavoatrial junction. COMPLICATIONS: None IMPRESSION: Successful 8 French right internal jugular vein power port placement with its tip at the SVC/RA junction. Electronically Signed   By: Marybelle Killings M.D.   On: 04/23/2017 10:25    ASSESSMENT & PLAN:  Endometrial cancer ([place]) She tolerated  treatment very well without major side effects She has gained some weight We will proceed with cycle 2 without dose adjustment I plan to repeat imaging study after 3 cycles of chemotherapy I recommend reducing dexamethasone to 2 mg daily and then to reduce it to 2 mg every other day starting May 28, 2017 Her husband has done considerable research He has heard of a lot of wonderful alternative treatment in other countries that has high cure rate but are not approved to be used this cancer and wondering why that has not been tried I told the patient and her husband to bring me copies of research data related to all these alternative treatment I told him most of those treatments are not indicated in her situation due to different cancer diagnosis and different situation I also recommend second opinion elsewhere if they are interested  Cancer associated pain Her cancer pain has resolved I recommend she continue to take pain medicine as needed  Other constipation She had severe constipation secondary to chemotherapy and side effects of narcotic prescription Her bowel habits has improved with regular laxative therapy on a regular basis   No orders of the defined types were placed in this encounter.  All questions were answered. The patient knows to call the clinic with any problems, questions or concerns. No barriers to learning was detected. I spent 25 minutes counseling the patient face to face. The total time spent in the appointment was 30 minutes and more than 50% was on counseling and review of test results     Heath Lark, MD 05/17/2017 2:54 PM

## 2017-05-17 NOTE — Assessment & Plan Note (Signed)
She had severe constipation secondary to chemotherapy and side effects of narcotic prescription Her bowel habits has improved with regular laxative therapy on a regular basis

## 2017-05-17 NOTE — Patient Instructions (Signed)
[facility] Discharge Instructions for Patients Receiving Chemotherapy  Today you received the following chemotherapy agents: Adriamycin and Cisplatin   To help prevent nausea and vomiting after your treatment, we encourage you to take your nausea medication as directed.    If you develop nausea and vomiting that is not controlled by your nausea medication, call the clinic.   BELOW ARE SYMPTOMS THAT SHOULD BE REPORTED IMMEDIATELY:  *FEVER GREATER THAN 100.5 F  *CHILLS WITH OR WITHOUT FEVER  NAUSEA AND VOMITING THAT IS NOT CONTROLLED WITH YOUR NAUSEA MEDICATION  *UNUSUAL SHORTNESS OF BREATH  *UNUSUAL BRUISING OR BLEEDING  TENDERNESS IN MOUTH AND THROAT WITH OR WITHOUT PRESENCE OF ULCERS  *URINARY PROBLEMS  *BOWEL PROBLEMS  UNUSUAL RASH Items with * indicate a potential emergency and should be followed up as soon as possible.  Feel free to call the clinic should you have any questions or concerns. The clinic phone number is [phone].  Please show the [facility] at check-in to the Emergency Department and triage nurse.   Doxorubicin injection (Adriamycin)  What is this medicine? DOXORUBICIN (dox oh ROO bi sin) is a chemotherapy drug. It is used to treat many kinds of cancer like leukemia, lymphoma, neuroblastoma, sarcoma, and Wilms' tumor. It is also used to treat bladder cancer, breast cancer, lung cancer, ovarian cancer, stomach cancer, and thyroid cancer. This medicine may be used for other purposes; ask your health care provider or pharmacist if you have questions. COMMON BRAND NAME(S): Adriamycin, Adriamycin PFS, Adriamycin RDF, Rubex What should I tell my health care provider before I take this medicine? They need to know if you have any of these conditions: -heart disease -history of low blood counts caused by a medicine -liver disease -recent or ongoing radiation therapy -an unusual or allergic reaction to doxorubicin, other chemotherapy  agents, other medicines, foods, dyes, or preservatives -pregnant or trying to get pregnant -breast-feeding How should I use this medicine? This drug is given as an infusion into a vein. It is administered in a hospital or clinic by a specially trained health care professional. If you have pain, swelling, burning or any unusual feeling around the site of your injection, tell your health care professional right away. Talk to your pediatrician regarding the use of this medicine in children. Special care may be needed. Overdosage: If you think you have taken too much of this medicine contact a poison control center or emergency room at once. NOTE: This medicine is only for you. Do not share this medicine with others. What if I miss a dose? It is important not to miss your dose. Call your doctor or health care professional if you are unable to keep an appointment. What may interact with this medicine? This medicine may interact with the following medications: -6-mercaptopurine -paclitaxel -phenytoin -St. John's Wort -trastuzumab -verapamil This list may not describe all possible interactions. Give your health care provider a list of all the medicines, herbs, non-prescription drugs, or dietary supplements you use. Also tell them if you smoke, drink alcohol, or use illegal drugs. Some items may interact with your medicine. What should I watch for while using this medicine? This drug may make you feel generally unwell. This is not uncommon, as chemotherapy can affect healthy cells as well as cancer cells. Report any side effects. Continue your course of treatment even though you feel ill unless your doctor tells you to stop. There is a maximum amount of this medicine you should receive throughout your  life. The amount depends on the medical condition being treated and your overall health. Your doctor will watch how much of this medicine you receive in your lifetime. Tell your doctor if you have taken  this medicine before. You may need blood work done while you are taking this medicine. Your urine may turn red for a few days after your dose. This is not blood. If your urine is dark or brown, call your doctor. In some cases, you may be given additional medicines to help with side effects. Follow all directions for their use. Call your doctor or health care professional for advice if you get a fever, chills or sore throat, or other symptoms of a cold or flu. Do not treat yourself. This drug decreases your body's ability to fight infections. Try to avoid being around people who are sick. This medicine may increase your risk to bruise or bleed. Call your doctor or health care professional if you notice any unusual bleeding. Talk to your doctor about your risk of cancer. You may be more at risk for certain types of cancers if you take this medicine. Do not become pregnant while taking this medicine or for 6 months after stopping it. Women should inform their doctor if they wish to become pregnant or think they might be pregnant. Men should not father a child while taking this medicine and for 6 months after stopping it. There is a potential for serious side effects to an unborn child. Talk to your health care professional or pharmacist for more information. Do not breast-feed an infant while taking this medicine. This medicine has caused ovarian failure in some women and reduced sperm counts in some men This medicine may interfere with the ability to have a child. Talk with your doctor or health care professional if you are concerned about your fertility. What side effects may I notice from receiving this medicine? Side effects that you should report to your doctor or health care professional as soon as possible: -allergic reactions like skin rash, itching or hives, swelling of the face, lips, or tongue -breathing problems -chest pain -fast or irregular heartbeat -low blood counts - this medicine may  decrease the number of white blood cells, red blood cells and platelets. You may be at increased risk for infections and bleeding. -pain, redness, or irritation at site where injected -signs of infection - fever or chills, cough, sore throat, pain or difficulty passing urine -signs of decreased platelets or bleeding - bruising, pinpoint red spots on the skin, black, tarry stools, blood in the urine -swelling of the ankles, feet, hands -tiredness -weakness Side effects that usually do not require medical attention (report to your doctor or health care professional if they continue or are bothersome): -diarrhea -hair loss -mouth sores -nail discoloration or damage -nausea -red colored urine -vomiting This list may not describe all possible side effects. Call your doctor for medical advice about side effects. You may report side effects to FDA at 1-800-FDA-1088. Where should I keep my medicine? This drug is given in a hospital or clinic and will not be stored at home. NOTE: This sheet is a summary. It may not cover all possible information. If you have questions about this medicine, talk to your doctor, pharmacist, or health care provider.  2018 Elsevier/Gold Standard (2015-07-11 11:28:51)  Cisplatin injection What is this medicine? CISPLATIN (SIS pla tin) is a chemotherapy drug. It targets fast dividing cells, like cancer cells, and causes these cells to die. This medicine  is used to treat many types of cancer like bladder, ovarian, and testicular cancers. This medicine may be used for other purposes; ask your health care provider or pharmacist if you have questions. COMMON BRAND NAME(S): Platinol, Platinol -AQ What should I tell my health care provider before I take this medicine? They need to know if you have any of these conditions: -blood disorders -hearing problems -kidney disease -recent or ongoing radiation therapy -an unusual or allergic reaction to cisplatin, carboplatin,  other chemotherapy, other medicines, foods, dyes, or preservatives -pregnant or trying to get pregnant -breast-feeding How should I use this medicine? This drug is given as an infusion into a vein. It is administered in a hospital or clinic by a specially trained health care professional. Talk to your pediatrician regarding the use of this medicine in children. Special care may be needed. Overdosage: If you think you have taken too much of this medicine contact a poison control center or emergency room at once. NOTE: This medicine is only for you. Do not share this medicine with others. What if I miss a dose? It is important not to miss a dose. Call your doctor or health care professional if you are unable to keep an appointment. What may interact with this medicine? -dofetilide -foscarnet -medicines for seizures -medicines to increase blood counts like filgrastim, pegfilgrastim, sargramostim -probenecid -pyridoxine used with altretamine -rituximab -some antibiotics like amikacin, gentamicin, neomycin, polymyxin B, streptomycin, tobramycin -sulfinpyrazone -vaccines -zalcitabine Talk to your doctor or health care professional before taking any of these medicines: -acetaminophen -aspirin -ibuprofen -ketoprofen -naproxen This list may not describe all possible interactions. Give your health care provider a list of all the medicines, herbs, non-prescription drugs, or dietary supplements you use. Also tell them if you smoke, drink alcohol, or use illegal drugs. Some items may interact with your medicine. What should I watch for while using this medicine? Your condition will be monitored carefully while you are receiving this medicine. You will need important blood work done while you are taking this medicine. This drug may make you feel generally unwell. This is not uncommon, as chemotherapy can affect healthy cells as well as cancer cells. Report any side effects. Continue your course of  treatment even though you feel ill unless your doctor tells you to stop. In some cases, you may be given additional medicines to help with side effects. Follow all directions for their use. Call your doctor or health care professional for advice if you get a fever, chills or sore throat, or other symptoms of a cold or flu. Do not treat yourself. This drug decreases your body's ability to fight infections. Try to avoid being around people who are sick. This medicine may increase your risk to bruise or bleed. Call your doctor or health care professional if you notice any unusual bleeding. Be careful brushing and flossing your teeth or using a toothpick because you may get an infection or bleed more easily. If you have any dental work done, tell your dentist you are receiving this medicine. Avoid taking products that contain aspirin, acetaminophen, ibuprofen, naproxen, or ketoprofen unless instructed by your doctor. These medicines may hide a fever. Do not become pregnant while taking this medicine. Women should inform their doctor if they wish to become pregnant or think they might be pregnant. There is a potential for serious side effects to an unborn child. Talk to your health care professional or pharmacist for more information. Do not breast-feed an infant while taking this medicine.  Drink fluids as directed while you are taking this medicine. This will help protect your kidneys. Call your doctor or health care professional if you get diarrhea. Do not treat yourself. What side effects may I notice from receiving this medicine? Side effects that you should report to your doctor or health care professional as soon as possible: -allergic reactions like skin rash, itching or hives, swelling of the face, lips, or tongue -signs of infection - fever or chills, cough, sore throat, pain or difficulty passing urine -signs of decreased platelets or bleeding - bruising, pinpoint red spots on the skin, black,  tarry stools, nosebleeds -signs of decreased red blood cells - unusually weak or tired, fainting spells, lightheadedness -breathing problems -changes in hearing -gout pain -low blood counts - This drug may decrease the number of white blood cells, red blood cells and platelets. You may be at increased risk for infections and bleeding. -nausea and vomiting -pain, swelling, redness or irritation at the injection site -pain, tingling, numbness in the hands or feet -problems with balance, movement -trouble passing urine or change in the amount of urine Side effects that usually do not require medical attention (report to your doctor or health care professional if they continue or are bothersome): -changes in vision -loss of appetite -metallic taste in the mouth or changes in taste This list may not describe all possible side effects. Call your doctor for medical advice about side effects. You may report side effects to FDA at 1-800-FDA-1088. Where should I keep my medicine? This drug is given in a hospital or clinic and will not be stored at home. NOTE: This sheet is a summary. It may not cover all possible information. If you have questions about this medicine, talk to your doctor, pharmacist, or health care provider.  2018 Elsevier/Gold Standard (2007-08-19 14:40:54)

## 2017-05-17 NOTE — Telephone Encounter (Signed)
Scheduled appt per 12/21 los - Gave patient AVS and calender per los.  

## 2017-05-17 NOTE — Assessment & Plan Note (Signed)
Her cancer pain has resolved I recommend she continue to take pain medicine as needed

## 2017-05-17 NOTE — Patient Instructions (Signed)
Implanted Port Home Guide An implanted port is a type of central line that is placed under the skin. Central lines are used to provide IV access when treatment or nutrition needs to be given through a person's veins. Implanted ports are used for long-term IV access. An implanted port may be placed because:  You need IV medicine that would be irritating to the small veins in your hands or arms.  You need long-term IV medicines, such as antibiotics.  You need IV nutrition for a long period.  You need frequent blood draws for lab tests.  You need dialysis.  Implanted ports are usually placed in the chest area, but they can also be placed in the upper arm, the abdomen, or the leg. An implanted port has two main parts:  Reservoir. The reservoir is round and will appear as a small, raised area under your skin. The reservoir is the part where a needle is inserted to give medicines or draw blood.  Catheter. The catheter is a thin, flexible tube that extends from the reservoir. The catheter is placed into a large vein. Medicine that is inserted into the reservoir goes into the catheter and then into the vein.  How will I care for my incision site? Do not get the incision site wet. Bathe or shower as directed by your health care provider. How is my port accessed? Special steps must be taken to access the port:  Before the port is accessed, a numbing cream can be placed on the skin. This helps numb the skin over the port site.  Your health care provider uses a sterile technique to access the port. ? Your health care provider must put on a mask and sterile gloves. ? The skin over your port is cleaned carefully with an antiseptic and allowed to dry. ? The port is gently pinched between sterile gloves, and a needle is inserted into the port.  Only "non-coring" port needles should be used to access the port. Once the port is accessed, a blood return should be checked. This helps ensure that the port  is in the vein and is not clogged.  If your port needs to remain accessed for a constant infusion, a clear (transparent) bandage will be placed over the needle site. The bandage and needle will need to be changed every week, or as directed by your health care provider.  Keep the bandage covering the needle clean and dry. Do not get it wet. Follow your health care provider's instructions on how to take a shower or bath while the port is accessed.  If your port does not need to stay accessed, no bandage is needed over the port.  What is flushing? Flushing helps keep the port from getting clogged. Follow your health care provider's instructions on how and when to flush the port. Ports are usually flushed with saline solution or a medicine called heparin. The need for flushing will depend on how the port is used.  If the port is used for intermittent medicines or blood draws, the port will need to be flushed: ? After medicines have been given. ? After blood has been drawn. ? As part of routine maintenance.  If a constant infusion is running, the port may not need to be flushed.  How long will my port stay implanted? The port can stay in for as long as your health care provider thinks it is needed. When it is time for the port to come out, surgery will be   done to remove it. The procedure is similar to the one performed when the port was put in. When should I seek immediate medical care? When you have an implanted port, you should seek immediate medical care if:  You notice a bad smell coming from the incision site.  You have swelling, redness, or drainage at the incision site.  You have more swelling or pain at the port site or the surrounding area.  You have a fever that is not controlled with medicine.  This information is not intended to replace advice given to you by your health care provider. Make sure you discuss any questions you have with your health care provider. Document  Released: 05/14/2005 Document Revised: 10/20/2015 Document Reviewed: 01/19/2013 Elsevier Interactive Patient Education  2017 [employer]  

## 2017-05-20 ENCOUNTER — Ambulatory Visit (HOSPITAL_BASED_OUTPATIENT_CLINIC_OR_DEPARTMENT_OTHER): Payer: Self-pay | Admitting: Medical

## 2017-05-20 ENCOUNTER — Ambulatory Visit (HOSPITAL_BASED_OUTPATIENT_CLINIC_OR_DEPARTMENT_OTHER): Payer: Self-pay

## 2017-05-20 VITALS — Wt 111.2 lb

## 2017-05-20 VITALS — BP 100/77 | HR 115 | Temp 98.1°F | Resp 20

## 2017-05-20 DIAGNOSIS — B370 Candidal stomatitis: Secondary | ICD-10-CM

## 2017-05-20 DIAGNOSIS — R1030 Lower abdominal pain, unspecified: Secondary | ICD-10-CM

## 2017-05-20 DIAGNOSIS — C541 Malignant neoplasm of endometrium: Secondary | ICD-10-CM

## 2017-05-20 DIAGNOSIS — D701 Agranulocytosis secondary to cancer chemotherapy: Secondary | ICD-10-CM

## 2017-05-20 DIAGNOSIS — E860 Dehydration: Secondary | ICD-10-CM

## 2017-05-20 DIAGNOSIS — Z95828 Presence of other vascular implants and grafts: Secondary | ICD-10-CM

## 2017-05-20 DIAGNOSIS — J069 Acute upper respiratory infection, unspecified: Secondary | ICD-10-CM

## 2017-05-20 LAB — CBC WITH DIFFERENTIAL/PLATELET
BASO%: 0.1 % (ref 0.0–2.0)
Basophils Absolute: 0 10*3/uL (ref 0.0–0.1)
EOS ABS: 0 10*3/uL (ref 0.0–0.5)
EOS%: 0 % (ref 0.0–7.0)
HEMATOCRIT: 38.3 % (ref 34.8–46.6)
HGB: 13.2 g/dL (ref 11.6–15.9)
LYMPH%: 12.5 % — ABNORMAL LOW (ref 14.0–49.7)
MCH: 34 pg (ref 25.1–34.0)
MCHC: 34.5 g/dL (ref 31.5–36.0)
MCV: 98.7 fL (ref 79.5–101.0)
MONO#: 0.5 10*3/uL (ref 0.1–0.9)
MONO%: 4 % (ref 0.0–14.0)
NEUT%: 83.4 % — ABNORMAL HIGH (ref 38.4–76.8)
NEUTROS ABS: 10.3 10*3/uL — AB (ref 1.5–6.5)
NRBC: 0 % (ref 0–0)
PLATELETS: 242 10*3/uL (ref 145–400)
RBC: 3.88 10*6/uL (ref 3.70–5.45)
RDW: 15 % — AB (ref 11.2–14.5)
WBC: 12.3 10*3/uL — AB (ref 3.9–10.3)
lymph#: 1.5 10*3/uL (ref 0.9–3.3)

## 2017-05-20 LAB — COMPREHENSIVE METABOLIC PANEL
ALT: 71 U/L — ABNORMAL HIGH (ref 0–55)
AST: 32 U/L (ref 5–34)
Albumin: 4 g/dL (ref 3.5–5.0)
Alkaline Phosphatase: 95 U/L (ref 40–150)
Anion Gap: 15 mEq/L — ABNORMAL HIGH (ref 3–11)
BUN: 23.8 mg/dL (ref 7.0–26.0)
CALCIUM: 9.5 mg/dL (ref 8.4–10.4)
CHLORIDE: 96 meq/L — AB (ref 98–109)
CO2: 22 mEq/L (ref 22–29)
Creatinine: 0.7 mg/dL (ref 0.6–1.1)
EGFR: >60 mL/min/{1.73_m2} (ref >60)
Glucose: 109 mg/dl (ref 70–140)
Potassium: 4.2 mEq/L (ref 3.5–5.1)
Sodium: 133 mEq/L — ABNORMAL LOW (ref 136–145)
Total Bilirubin: 0.4 mg/dL (ref 0.20–1.20)
Total Protein: 7.7 g/dL (ref 6.4–8.3)

## 2017-05-20 MED ORDER — PEGFILGRASTIM INJECTION 6 MG/0.6ML ~~LOC~~
PREFILLED_SYRINGE | SUBCUTANEOUS | Status: AC
  Filled 2017-05-20: qty 0.6

## 2017-05-20 MED ORDER — SODIUM CHLORIDE 0.9 % IV SOLN
Freq: Once | INTRAVENOUS | Status: AC
  Administered 2017-05-20: 15:00:00 via INTRAVENOUS

## 2017-05-20 MED ORDER — HEPARIN SOD (PORK) LOCK FLUSH 100 UNIT/ML IV SOLN
500.0000 [IU] | INTRAVENOUS | Status: AC | PRN
  Administered 2017-05-20: 500 [IU]
  Filled 2017-05-20: qty 5

## 2017-05-20 MED ORDER — PEGFILGRASTIM INJECTION 6 MG/0.6ML ~~LOC~~
6.0000 mg | PREFILLED_SYRINGE | Freq: Once | SUBCUTANEOUS | Status: AC
  Administered 2017-05-20: 6 mg via SUBCUTANEOUS

## 2017-05-20 MED ORDER — SODIUM CHLORIDE 0.9% FLUSH
10.0000 mL | INTRAVENOUS | Status: AC | PRN
  Administered 2017-05-20: 10 mL
  Filled 2017-05-20: qty 10

## 2017-05-20 MED ORDER — NYSTATIN 100000 UNIT/ML MT SUSP: OROMUCOSAL | 0 refills | Status: AC

## 2017-05-20 MED ORDER — CEFUROXIME AXETIL 250 MG PO TABS: 250.0000 mg | ORAL_TABLET | Freq: Two times a day (BID) | ORAL | 0 refills | Status: DC

## 2017-05-20 MED FILL — NYSTATIN 100,000 UNITS/ML S: 100000 | 7 days supply | Qty: 150 | Fill #0

## 2017-05-20 MED FILL — CEFUROXIME AXETIL 250 MG TA: 250 | 7 days supply | Qty: 14 | Fill #0

## 2017-05-20 NOTE — Progress Notes (Signed)
[facility] Progress Note   Kerry Morris [id number] 06-14-1965 51 y.o.  Kerry Morris is managed by Dr. Heath Morris  Actively treated with chemotherapy: yes  Current Therapy: Cisplatin and doxorubicin with Neulasta support  Last Treated: 05/17/2017 with Neulasta given today  Assessment: Plan:    Dehydration - Plan: CBC with Differential, Comprehensive metabolic panel, 0.9 %  sodium chloride infusion, CANCELED: CBC with Differential, CANCELED: Comprehensive metabolic panel  Oral candidiasis - Plan: nystatin (MYCOSTATIN) 100000 UNIT/ML suspension  Upper respiratory tract infection, unspecified type - Plan: cefUROXime (CEFTIN) 250 MG tablet  Endometrial cancer (HCC)   Dehydration: A CBC and chemistry panel are collected today.  The patient was given 1 L of normal saline IV today.  She was also instructed to push fluids.  Oral candidiasis: The patient was given a prescription for nystatin swish and swallow p.o. 4 times daily.  Upper respiratory tract infection: Patient was given a prescription for Ceftin 250 mg p.o. twice daily  Endometrial cancer: The patient is status post cycle 2 of chemotherapy with cisplatin and doxorubicin dosed on 05/17/2017.  Please see After Visit Summary for patient specific instructions.  Future Appointments  Date Time Provider [place]  06/14/2017  8:00 AM [facility] LAB 4 [facility] None  06/14/2017  8:00 AM [facility] FLUSH NURSE 2 [facility] None  06/14/2017  8:30 AM Kerry Lark, MD [facility] None  06/14/2017  9:30 AM [facility] F20 [facility] None  06/17/2017  2:00 PM [facility] INJ NURSE [facility] None    Orders Placed This Encounter  Procedures  . CBC with Differential  . Comprehensive metabolic panel       Subjective:   Patient ID:  Kerry Morris is a 51 y.o. (DOB December 14, 1965) female.  Chief Complaint: No chief complaint on file.   HPI Kerry Morris is a 51 year old female with a diagnosis of an  endometrial cancer who is status post cycle 2 of cisplatin and doxorubicin which was dosed on 05/17/2017.  The patient received Neulasta today.  She reports that she has had generalized weakness.  She also reports having decreased p.o. intake for solids and liquids believes that she may be dehydrated.  She does not like to drink water.  She has been tapering off of dexamethasone and is currently on 2 mg daily.  She has had a productive cough with yellowish to green sputum for the past 3-4 days.  She denies fevers, chills, or sweats.  She denies nausea, vomiting, constipation, or diarrhea.  Medications: I have reviewed the patient's current medications.  Allergies: No Known Allergies  Past Medical History:  Diagnosis Date  . Anxiety   . Anxiety and depression   . Cancer ([place])   . Depression   . Genetic testing 01/10/2017   Ms. Trettin underwent genetic counseling and testing for hereditary cancer syndromes on 12/26/2016. Her results were negative for mutations in all 83 genes analyzed by [facility]'s 83-gene Multi-Cancers Panel. Genes analyzed include: ALK, APC, ATM, AXIN2, BAP1, BARD1, BLM, BMPR1A, BRCA1, BRCA2, BRIP1, CASR, CDC73, CDH1, CDK4, CDKN1B, CDKN1C, CDKN2A, CEBPA, CHEK2, CTNNA1, DICER1, DIS3L2, EGFR, EPCAM, F    Past Surgical History:  Procedure Laterality Date  . CERVICAL BIOPSY  W/ LOOP ELECTRODE EXCISION    . DENTAL SURGERY    . IR FLUORO GUIDE PORT INSERTION RIGHT  04/23/2017  . IR US GUIDE VASC ACCESS RIGHT  04/23/2017    Family History  Problem Relation Age of Onset  . Leukemia Mother 33       in  remission  . Throat cancer Maternal Aunt        d.>50  . Alcohol abuse Maternal Uncle        d.>50    Social History   Socioeconomic History  . Marital status: Married    Spouse name: Kirtland Bouchard  . Number of children: 0  . Years of education: Not on file  . Highest education level: Not on file  Social Needs  . Financial resource strain: Not on file  . Food insecurity - worry:  Not on file  . Food insecurity - inability: Not on file  . Transportation needs - medical: Not on file  . Transportation needs - non-medical: Not on file  Occupational History  . 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 Smoking status: Former Smoker    Packs/day: 1.00    Years: 25.00    Pack years: 25.00    Types: Cigarettes    Last attempt to quit: 03/28/2015    Years since quitting: 2.1  . Smokeless tobacco: Never Used  Substance and Sexual Activity  . Alcohol use: No    Alcohol/week: 0.0 oz    Comment: Previous h/o alcoholism, sober x 8 years  . Drug use: Yes    Types: Marijuana    Comment: remote h/o IVDU, has been worked up for parenterally transmitted infections multiple times since  . Sexual activity: Yes    Partners: Male  Other Topics Concern  . Not on file  Social History Narrative   Lives with husband who is also her business partner. They own and run a bar/club. Recent financial difficulties have been a significant source of stress for them. In addition, patient is grieving the loss of a close family member/friend in Oct 2016. As a result, Agnieszka reports poor diet for the past several months with notable unintentional weight loss.    Past Medical History, Surgical history, Social history, and Family history were reviewed and updated as appropriate.   Please see review of systems for further details on the patient's review from today.   Review of Systems:  Review of Systems  Constitutional: Positive for appetite change, fatigue and unexpected weight change. Negative for chills, diaphoresis and fever.  HENT: Negative for postnasal drip, rhinorrhea, sinus pressure, sinus pain and trouble swallowing.   Respiratory: Positive for cough. Negative for shortness of breath and wheezing.   Cardiovascular: Negative for chest pain.  Gastrointestinal: Negative for constipation, diarrhea, nausea and vomiting.    Objective:   Physical Exam:  Wt 111 lb 3.2 oz  (50.4 kg)   BMI 18.50 kg/m  ECOG: 1  Physical Exam  Constitutional: No distress.  HENT:  Head: Normocephalic and atraumatic.  Right Ear: External ear normal.  Left Ear: External ear normal.  Cardiovascular: Regular rhythm, S1 normal and S2 normal. Tachycardia present. Exam reveals no friction rub.  No murmur heard. Right chest wall Port-A-Cath noted.  Pulmonary/Chest: Effort normal and breath sounds normal. No respiratory distress. She has no wheezes. She has no rales.  Abdominal: Soft. Bowel sounds are normal. She exhibits no distension. There is no tenderness. There is no rebound.  Neurological: She is alert. Coordination normal.  Skin: Skin is warm and dry. She is not diaphoretic.    Lab Review:     Component Value Date/Time   NA 133 (L) 05/20/2017 1443   K 4.2 05/20/2017 1443   CL 98 (L) 04/23/2017 1000   CO2 22 05/20/2017 1443   GLUCOSE 109 05/20/2017 1443  BUN 23.8 05/20/2017 1443   CREATININE 0.7 05/20/2017 1443   CALCIUM 9.5 05/20/2017 1443   PROT 7.7 05/20/2017 1443   ALBUMIN 4.0 05/20/2017 1443   AST 32 05/20/2017 1443   ALT 71 (H) 05/20/2017 1443   ALKPHOS 95 05/20/2017 1443   BILITOT 0.40 05/20/2017 1443   GFRNONAA >60 04/23/2017 1000   GFRAA >60 04/23/2017 1000       Component Value Date/Time   WBC 12.3 (H) 05/20/2017 1444   WBC 3.7 (L) 04/23/2017 0719   RBC 3.88 05/20/2017 1444   RBC 3.55 (L) 04/23/2017 0719   HGB 13.2 05/20/2017 1444   HCT 38.3 05/20/2017 1444   PLT 242 05/20/2017 1444   MCV 98.7 05/20/2017 1444   MCH 34.0 05/20/2017 1444   MCH 33.8 04/23/2017 0719   MCHC 34.5 05/20/2017 1444   MCHC 34.3 04/23/2017 0719   RDW 15.0 (H) 05/20/2017 1444   LYMPHSABS 1.5 05/20/2017 1444   MONOABS 0.5 05/20/2017 1444   EOSABS 0.0 05/20/2017 1444   BASOSABS 0.0 05/20/2017 1444   -------------------------------  Imaging from last 24 hours (if applicable):  Radiology interpretation: Ir US Guide Vasc Access Right  Result Date:  04/23/2017 CLINICAL DATA:  Endometrial carcinoma EXAM: TUNNEL POWER PORT PLACEMENT WITH SUBCUTANEOUS POCKET UTILIZING ULTRASOUND & FLOUROSCOPY FLUOROSCOPY TIME:  30 seconds. MEDICATIONS AND MEDICAL HISTORY: Versed 3 mg, Fentanyl 175 mcg. Additional Medications: Ancef 2 g. Antibiotics were given within 2 hours of the procedure. ANESTHESIA/SEDATION: Moderate sedation time: 27 minutes. Nursing monitored the the patient during the procedure. PROCEDURE: After written informed consent was obtained, patient was placed in the supine position on angiographic table. The right neck and chest was prepped and draped in a sterile fashion. Lidocaine was utilized for local anesthesia. The right jugular vein was noted to be patent initially with ultrasound. Under sonographic guidance, a micropuncture needle was inserted into the right IJ vein (Ultrasound and fluoroscopic image documentation was performed). The needle was removed over an 018 wire which was exchanged for a Amplatz. This was advanced into the IVC. An 8-French dilator was advanced over the Amplatz. A small incision was made in the right upper chest over the anterior right second rib. Utilizing blunt dissection, a subcutaneous pocket was created in the caudal direction. The pocket was irrigated with a copious amount of sterile normal saline. The port catheter was tunneled from the chest incision, and out the neck incision. The reservoir was inserted into the subcutaneous pocket and secured with two 3-0 Ethilon stitches. A peel-away sheath was advanced over the Amplatz wire. The port catheter was cut to measure length and inserted through the peel-away sheath. The peel-away sheath was removed. The chest incision was closed with 3-0 Vicryl interrupted stitches for the subcutaneous tissue and a running of 4-0 Vicryl subcuticular stitch for the skin. The neck incision was closed with a 4-0 Vicryl subcuticular stitch. Derma-bond was applied to both surgical incisions. The  port reservoir was flushed and instilled with heparinized saline. No complications. FINDINGS: A right IJ vein Port-A-Cath is in place with its tip at the cavoatrial junction. COMPLICATIONS: None IMPRESSION: Successful 8 French right internal jugular vein power port placement with its tip at the SVC/RA junction. Electronically Signed   By: Marybelle Killings M.D.   On: 04/23/2017 10:25   Ir Fluoro Guide Port Insertion Right  Result Date: 04/23/2017 CLINICAL DATA:  Endometrial carcinoma EXAM: TUNNEL POWER PORT PLACEMENT WITH SUBCUTANEOUS POCKET UTILIZING ULTRASOUND & FLOUROSCOPY FLUOROSCOPY TIME:  30 seconds. MEDICATIONS AND  MEDICAL HISTORY: Versed 3 mg, Fentanyl 175 mcg. Additional Medications: Ancef 2 g. Antibiotics were given within 2 hours of the procedure. ANESTHESIA/SEDATION: Moderate sedation time: 27 minutes. Nursing monitored the the patient during the procedure. PROCEDURE: After written informed consent was obtained, patient was placed in the supine position on angiographic table. The right neck and chest was prepped and draped in a sterile fashion. Lidocaine was utilized for local anesthesia. The right jugular vein was noted to be patent initially with ultrasound. Under sonographic guidance, a micropuncture needle was inserted into the right IJ vein (Ultrasound and fluoroscopic image documentation was performed). The needle was removed over an 018 wire which was exchanged for a Amplatz. This was advanced into the IVC. An 8-French dilator was advanced over the Amplatz. A small incision was made in the right upper chest over the anterior right second rib. Utilizing blunt dissection, a subcutaneous pocket was created in the caudal direction. The pocket was irrigated with a copious amount of sterile normal saline. The port catheter was tunneled from the chest incision, and out the neck incision. The reservoir was inserted into the subcutaneous pocket and secured with two 3-0 Ethilon stitches. A peel-away sheath  was advanced over the Amplatz wire. The port catheter was cut to measure length and inserted through the peel-away sheath. The peel-away sheath was removed. The chest incision was closed with 3-0 Vicryl interrupted stitches for the subcutaneous tissue and a running of 4-0 Vicryl subcuticular stitch for the skin. The neck incision was closed with a 4-0 Vicryl subcuticular stitch. Derma-bond was applied to both surgical incisions. The port reservoir was flushed and instilled with heparinized saline. No complications. FINDINGS: A right IJ vein Port-A-Cath is in place with its tip at the cavoatrial junction. COMPLICATIONS: None IMPRESSION: Successful 8 French right internal jugular vein power port placement with its tip at the SVC/RA junction. Electronically Signed   By: Marybelle Killings M.D.   On: 04/23/2017 10:25

## 2017-05-20 NOTE — Progress Notes (Signed)
These preliminary result these preliminary results were noted.  Awaiting final report.

## 2017-05-20 NOTE — Patient Instructions (Signed)
Pegfilgrastim injection What is this medicine? PEGFILGRASTIM (PEG fil gra stim) is a long-acting granulocyte colony-stimulating factor that stimulates the growth of neutrophils, a type of white blood cell important in the body's fight against infection. It is used to reduce the incidence of fever and infection in patients with certain types of cancer who are receiving chemotherapy that affects the bone marrow, and to increase survival after being exposed to high doses of radiation. This medicine may be used for other purposes; ask your health care provider or pharmacist if you have questions. COMMON BRAND NAME(S): Neulasta What should I tell my health care provider before I take this medicine? They need to know if you have any of these conditions: -kidney disease -latex allergy -ongoing radiation therapy -sickle cell disease -skin reactions to acrylic adhesives (On-Body Injector only) -an unusual or allergic reaction to pegfilgrastim, filgrastim, other medicines, foods, dyes, or preservatives -pregnant or trying to get pregnant -breast-feeding How should I use this medicine? This medicine is for injection under the skin. If you get this medicine at home, you will be taught how to prepare and give the pre-filled syringe or how to use the On-body Injector. Refer to the patient Instructions for Use for detailed instructions. Use exactly as directed. Tell your healthcare provider immediately if you suspect that the On-body Injector may not have performed as intended or if you suspect the use of the On-body Injector resulted in a missed or partial dose. It is important that you put your used needles and syringes in a special sharps container. Do not put them in a trash can. If you do not have a sharps container, call your pharmacist or healthcare provider to get one. Talk to your pediatrician regarding the use of this medicine in children. While this drug may be prescribed for selected conditions,  precautions do apply. Overdosage: If you think you have taken too much of this medicine contact a poison control center or emergency room at once. NOTE: This medicine is only for you. Do not share this medicine with others. What if I miss a dose? It is important not to miss your dose. Call your doctor or health care professional if you miss your dose. If you miss a dose due to an On-body Injector failure or leakage, a new dose should be administered as soon as possible using a single prefilled syringe for manual use. What may interact with this medicine? Interactions have not been studied. Give your health care provider a list of all the medicines, herbs, non-prescription drugs, or dietary supplements you use. Also tell them if you smoke, drink alcohol, or use illegal drugs. Some items may interact with your medicine. This list may not describe all possible interactions. Give your health care provider a list of all the medicines, herbs, non-prescription drugs, or dietary supplements you use. Also tell them if you smoke, drink alcohol, or use illegal drugs. Some items may interact with your medicine. What should I watch for while using this medicine? You may need blood work done while you are taking this medicine. If you are going to need a MRI, CT scan, or other procedure, tell your doctor that you are using this medicine (On-Body Injector only). What side effects may I notice from receiving this medicine? Side effects that you should report to your doctor or health care professional as soon as possible: -allergic reactions like skin rash, itching or hives, swelling of the face, lips, or tongue -dizziness -fever -pain, redness, or irritation at site   where injected -pinpoint red spots on the skin -red or dark-brown urine -shortness of breath or breathing problems -stomach or side pain, or pain at the shoulder -swelling -tiredness -trouble passing urine or change in the amount of urine Side  effects that usually do not require medical attention (report to your doctor or health care professional if they continue or are bothersome): -bone pain -muscle pain This list may not describe all possible side effects. Call your doctor for medical advice about side effects. You may report side effects to FDA at 1-800-FDA-1088. Where should I keep my medicine? Keep out of the reach of children. Store pre-filled syringes in a refrigerator between 2 and 8 degrees C (36 and 46 degrees F). Do not freeze. Keep in carton to protect from light. Throw away this medicine if it is left out of the refrigerator for more than 48 hours. Throw away any unused medicine after the expiration date. NOTE: This sheet is a summary. It may not cover all possible information. If you have questions about this medicine, talk to your doctor, pharmacist, or health care provider.  2018 [employer] (2016-05-10 12:58:03)  

## 2017-05-20 NOTE — Patient Instructions (Signed)
Dehydration, Adult Dehydration is a condition in which there is not enough fluid or water in the body. This happens when you lose more fluids than you take in. Important organs, such as the kidneys, brain, and heart, cannot function without a proper amount of fluids. Any loss of fluids from the body can lead to dehydration. Dehydration can range from mild to severe. This condition should be treated right away to prevent it from becoming severe. What are the causes? This condition may be caused by:  Vomiting.  Diarrhea.  Excessive sweating, such as from heat exposure or exercise.  Not drinking enough fluid, especially: ? When ill. ? While doing activity that requires a lot of energy.  Excessive urination.  Fever.  Infection.  Certain medicines, such as medicines that cause the body to lose excess fluid (diuretics).  Inability to access safe drinking water.  Reduced physical ability to get adequate water and food.  What increases the risk? This condition is more likely to develop in people:  Who have a poorly controlled long-term (chronic) illness, such as diabetes, heart disease, or kidney disease.  Who are age 65 or older.  Who are disabled.  Who live in a place with high altitude.  Who play endurance sports.  What are the signs or symptoms? Symptoms of mild dehydration may include:  Thirst.  Dry lips.  Slightly dry mouth.  Dry, warm skin.  Dizziness. Symptoms of moderate dehydration may include:  Very dry mouth.  Muscle cramps.  Dark urine. Urine may be the color of tea.  Decreased urine production.  Decreased tear production.  Heartbeat that is irregular or faster than normal (palpitations).  Headache.  Light-headedness, especially when you stand up from a sitting position.  Fainting (syncope). Symptoms of severe dehydration may include:  Changes in skin, such as: ? Cold and clammy skin. ? Blotchy (mottled) or pale skin. ? Skin that does  not quickly return to normal after being lightly pinched and released (poor skin turgor).  Changes in body fluids, such as: ? Extreme thirst. ? No tear production. ? Inability to sweat when body temperature is high, such as in hot weather. ? Very little urine production.  Changes in vital signs, such as: ? Weak pulse. ? Pulse that is more than 100 beats a minute when sitting still. ? Rapid breathing. ? Low blood pressure.  Other changes, such as: ? Sunken eyes. ? Cold hands and feet. ? Confusion. ? Lack of energy (lethargy). ? Difficulty waking up from sleep. ? Short-term weight loss. ? Unconsciousness. How is this diagnosed? This condition is diagnosed based on your symptoms and a physical exam. Blood and urine tests may be done to help confirm the diagnosis. How is this treated? Treatment for this condition depends on the severity. Mild or moderate dehydration can often be treated at home. Treatment should be started right away. Do not wait until dehydration becomes severe. Severe dehydration is an emergency and it needs to be treated in a hospital. Treatment for mild dehydration may include:  Drinking more fluids.  Replacing salts and minerals in your blood (electrolytes) that you may have lost. Treatment for moderate dehydration may include:  Drinking an oral rehydration solution (ORS). This is a drink that helps you replace fluids and electrolytes (rehydrate). It can be found at pharmacies and retail stores. Treatment for severe dehydration may include:  Receiving fluids through an IV tube.  Receiving an electrolyte solution through a feeding tube that is passed through your nose   and into your stomach (nasogastric tube, or NG tube).  Correcting any abnormalities in electrolytes.  Treating the underlying cause of dehydration. Follow these instructions at home:  If directed by your health care provider, drink an ORS: ? Make an ORS by following instructions on the  package. ? Start by drinking small amounts, about  cup (120 mL) every 5-10 minutes. ? Slowly increase how much you drink until you have taken the amount recommended by your health care provider.  Drink enough clear fluid to keep your urine clear or pale yellow. If you were told to drink an ORS, finish the ORS first, then start slowly drinking other clear fluids. Drink fluids such as: ? Water. Do not drink only water. Doing that can lead to having too little salt (sodium) in the body (hyponatremia). ? Ice chips. ? Fruit juice that you have added water to (diluted fruit juice). ? Low-calorie sports drinks.  Avoid: ? Alcohol. ? Drinks that contain a lot of sugar. These include high-calorie sports drinks, fruit juice that is not diluted, and soda. ? Caffeine. ? Foods that are greasy or contain a lot of fat or sugar.  Take over-the-counter and prescription medicines only as told by your health care provider.  Do not take sodium tablets. This can lead to having too much sodium in the body (hypernatremia).  Eat foods that contain a healthy balance of electrolytes, such as bananas, oranges, potatoes, tomatoes, and spinach.  Keep all follow-up visits as told by your health care provider. This is important. Contact a health care provider if:  You have abdominal pain that: ? Gets worse. ? Stays in one area (localizes).  You have a rash.  You have a stiff neck.  You are more irritable than usual.  You are sleepier or more difficult to wake up than usual.  You feel weak or dizzy.  You feel very thirsty.  You have urinated only a small amount of very dark urine over 6-8 hours. Get help right away if:  You have symptoms of severe dehydration.  You cannot drink fluids without vomiting.  Your symptoms get worse with treatment.  You have a fever.  You have a severe headache.  You have vomiting or diarrhea that: ? Gets worse. ? Does not go away.  You have blood or green matter  (bile) in your vomit.  You have blood in your stool. This may cause stool to look black and tarry.  You have not urinated in 6-8 hours.  You faint.  Your heart rate while sitting still is over 100 beats a minute.  You have trouble breathing. This information is not intended to replace advice given to you by your health care provider. Make sure you discuss any questions you have with your health care provider. Document Released: 05/14/2005 Document Revised: 12/09/2015 Document Reviewed: 07/08/2015 Elsevier Interactive Patient Education  2018 [employer]  

## 2017-05-23 ENCOUNTER — Encounter: Payer: Self-pay | Admitting: Pharmacy Technician

## 2017-05-23 NOTE — Progress Notes (Signed)
The patient is approved for drug assistance by [employer] for Neulasta. The enrollment period is from 05/15/17 - 05/15/18. Enrollment is based on Self Pay. No Orders have been entered for Neulasta at this time.

## 2017-05-29 ENCOUNTER — Encounter: Payer: Self-pay | Admitting: Hematology and Oncology

## 2017-05-29 NOTE — Progress Notes (Signed)
Patient's spouse came in to discuss several bills,grants and discounts.  Explained to him thoroughly what each letter and balance he had concerning the discount.  Also called Rob in drug replacement to discuss recent injection charges and if request has been made to have adjusted for drug replacement. Rob forwarded me a copy of email sent to [place] in billing. I sent an additional request to her as well.  Advised him that he should be receiving a new statement soon. He mentioned enrolling in [employer]. Advised him to contact billing to make these arrangements.  He also mentioned her now having insurance. Advised him to provide a copy of new insurance information as soon as possible before next treatment so that [facility] may start the authorization process prior to treatment. He states he will email to me and he verbalized understanding. Relayed this information to [facility].  He has my card for any additional financial questions or concerns.

## 2017-06-04 ENCOUNTER — Telehealth: Payer: Self-pay | Admitting: Hematology and Oncology

## 2017-06-04 NOTE — Telephone Encounter (Signed)
FAXED RECORDS TO [facility] [phone]

## 2017-06-06 ENCOUNTER — Telehealth: Payer: Self-pay | Admitting: Hematology and Oncology

## 2017-06-06 NOTE — Telephone Encounter (Signed)
06/06/17 faxed medical records to [facility] to [facility] [phone]. Release Z[facility]#[id number]

## 2017-06-14 ENCOUNTER — Encounter: Payer: Self-pay | Admitting: Hematology and Oncology

## 2017-06-14 ENCOUNTER — Ambulatory Visit: Payer: Self-pay

## 2017-06-14 ENCOUNTER — Inpatient Hospital Stay: Payer: BLUE CROSS/BLUE SHIELD | Attending: Hematology and Oncology | Admitting: Hematology and Oncology

## 2017-06-14 ENCOUNTER — Telehealth: Payer: Self-pay | Admitting: Hematology and Oncology

## 2017-06-14 ENCOUNTER — Inpatient Hospital Stay: Payer: BLUE CROSS/BLUE SHIELD

## 2017-06-14 ENCOUNTER — Inpatient Hospital Stay: Payer: BLUE CROSS/BLUE SHIELD | Admitting: Hematology and Oncology

## 2017-06-14 ENCOUNTER — Other Ambulatory Visit: Payer: Self-pay

## 2017-06-14 DIAGNOSIS — K5900 Constipation, unspecified: Secondary | ICD-10-CM | POA: Diagnosis not present

## 2017-06-14 DIAGNOSIS — G893 Neoplasm related pain (acute) (chronic): Secondary | ICD-10-CM | POA: Diagnosis not present

## 2017-06-14 DIAGNOSIS — C786 Secondary malignant neoplasm of retroperitoneum and peritoneum: Secondary | ICD-10-CM | POA: Diagnosis not present

## 2017-06-14 DIAGNOSIS — Z5189 Encounter for other specified aftercare: Secondary | ICD-10-CM | POA: Insufficient documentation

## 2017-06-14 DIAGNOSIS — Z5111 Encounter for antineoplastic chemotherapy: Secondary | ICD-10-CM | POA: Insufficient documentation

## 2017-06-14 DIAGNOSIS — C775 Secondary and unspecified malignant neoplasm of intrapelvic lymph nodes: Secondary | ICD-10-CM

## 2017-06-14 DIAGNOSIS — C541 Malignant neoplasm of endometrium: Secondary | ICD-10-CM | POA: Diagnosis not present

## 2017-06-14 DIAGNOSIS — N1330 Unspecified hydronephrosis: Secondary | ICD-10-CM

## 2017-06-14 DIAGNOSIS — Z7189 Other specified counseling: Secondary | ICD-10-CM

## 2017-06-14 DIAGNOSIS — K5909 Other constipation: Secondary | ICD-10-CM

## 2017-06-14 DIAGNOSIS — J439 Emphysema, unspecified: Secondary | ICD-10-CM | POA: Diagnosis not present

## 2017-06-14 DIAGNOSIS — I700 Atherosclerosis of aorta: Secondary | ICD-10-CM

## 2017-06-14 NOTE — Assessment & Plan Note (Signed)
The patient appears ill due to recent travel According to her husband, recent CT imaging study done a few days ago showed persistent disease It is not clear to me if she is responding to current chemotherapy or not He will bring me outside records I would reschedule her chemotherapy next week I will repeat echocardiogram after 3 cycles of Adriamycin Depending on my review on the outside CT,  I do not plan to repeat imaging study again next month despite the patient's husband requesting CT to be done per outside physician's recommendation

## 2017-06-14 NOTE — Telephone Encounter (Signed)
Spoke to patient regarding upcoming January appointment updates.

## 2017-06-14 NOTE — Assessment & Plan Note (Signed)
Her cancer pain is stable I recommend she continue to take pain medicine as needed

## 2017-06-14 NOTE — Assessment & Plan Note (Signed)
The patient is very miserable today because she have no bowel movement in 3 days, secondary to missing her regular laxative She does not want to be treated today We will reschedule her treatment next week and I recommend aggressive laxative therapy

## 2017-06-14 NOTE — Progress Notes (Signed)
[facility] OFFICE PROGRESS NOTE  Patient Care Team: Patient, No Pcp Per as PCP - General (General Practice)  SUMMARY OF ONCOLOGIC HISTORY: Oncology History   MSI stable, Her 2 neg, ER patchy positivity     Endometrial cancer ([place])   04/05/2015 Imaging    CT abdomen:  1. Large bilateral hydrosalpinx/pyosalpinx. 2. Enlarged fibroid uterus. 3. Mild bilateral hydronephrosis from #1.       04/06/2015 Procedure    Status post CT-guided drainage of right and left tubo-ovarian abscess, with parallel drains from a right trans gluteal approach. Sample was sent from both the left and the right fluid collections to the lab.      04/19/2015 Imaging    CT abdomen and pelvis: The bilateral pelvic fluid collections have resolved following placement of the percutaneous drainage catheters. Small amount of fluid in the pelvis but no evidence for residual abscess collections or hydrosalpinx. Fibroid uterus. Innumerable uterine fibroids. Many of these fibroids are pedunculated. Development of a small left pleural effusion. Mild fat stranding in the abdominal and pelvic mesentery suggestive for mild edema      05/03/2015 Imaging    US abdomen and pelvis 1. Significantly regressed but not completely resolved bilateral hydrosalpinx/pyosalpinx. Residual is greater on the right (18 mm diameter) with mild fluid complexity. 2. Stable fibroid uterus. 3. No pelvic free fluid      07/06/2015 Pathology Results    PAP smear NEGATIVE FOR INTRAEPITHELIAL LESIONS OR MALIGNANCY.      07/18/2015 Pathology Results    Vulva, biopsy, mass - BENIGN EPIDERMAL CYST. - NO EVIDENCE OF MALIGNANCY      10/21/2016 Initial Diagnosis    She presented to the ER for severe abdominal pain      10/22/2016 Imaging    CT abdomen and pelvis 1. 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 2. Complex partially cystic mass at the left  adnexa, measuring 5.9 x 5.5 x 4.3 cm. This raises concern for primary ovarian malignancy. PET/CT could be considered for further evaluation. 3. Numerous uterine fibroids again noted, some which have increased in size. Underlying endometrial malignancy cannot be excluded. 4. Dilated bilateral fluid collections have reaccumulated within the pelvis, the largest of which measures 15 cm in length. 5. Enlarged bilateral pelvic sidewall nodes measure up to 1.6 cm in short axis, compatible with metastatic disease. Mildly prominent retroperitoneal nodes seen. Mildly prominent nodes about the left inguinal vessels. 6. 1.6 cm cyst at the medial right hepatic lobe.       10/29/2016 - 11/02/2016 Hospital Admission    She was admitted to [facility] for management of newly diagnosed endometrial cancer      10/29/2016 Tumor Marker    Patient's tumor was tested for the following markers: CA125 Results of the tumor marker test revealed 740      10/30/2016 Surgery    Procedure(s): - Open ureteral exploration - Cystourethroscopy - Left ureteral stent placement  Operative Findings:   1.) Normal cystourethrosocpy without lesions, masses or stones. Bilateral ureteral orifices identified with efflux of clear yellow urine 2.) Successful placement of left ureteral stent under visualization with appropriate curl visualized within the bladder and stent palpable within the ureter to the level of the renal pelvis 3) Open exploration determined ureter to be intact and viable.      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) - Carcinoma involves outer half of myometrium, serosal surface, and cervix - Bilateral ovaries / adnexa are positive for high grade serous carcinoma - Extensive lymphovascular space invasion is present - See  synoptic report and comment  B: "Free floating abdominal mass", removal  - Benign cyst with mesothelial-type lining, consistent with peritoneal inclusion cyst, size 7.0 cm - No malignancy identified   C: Posterior cul-de-sac mass, excision  - Involved by high grade serous carcinoma  D: Lymph nodes, left pelvic, resection  - Five of six lymph nodes involved by metastatic high grade serous carcinoma, size of metastases up to 1.5 cm, with no definite extracapsular extension identified (5/6)  E: Lymph nodes, right pelvic, excision  - Four of four lymph nodes involved by metastatic high grade serous carcinoma, size of metastases up to 1.6 cm, with no definite extracapsular extension identified (4/4)  F: Lymph nodes, right para-aortic, resection  - Three of four lymph nodes involved by metastatic high grade serous carcinoma, size of metastases up to 1.7 cm, with no definite extracapsular extension identified (3/4)  G: Omentum, omentectomy  - Involved by metastatic high grade serous carcinoma, size of nodules up to 2.9 cm   Given the presence of a large endometrial mass with serous endometrial intraepithelial carcinoma as well as deep myometrial invasion and cervical and serosal involvement, the tumor is staged as an endometrial primary. However, ovarian primary with spread to the uterus or synchronous primary tumors are also a diagnostic possibility, and definitive site of origin cannot be determined histologically. Extensive lymphovascular space invasion is present, and the tumor involves both ovaries, peritoneum, omentum, and multiple lymph nodes. No distinct fallopian tubes are identified grossly or microscopically; however, they may have been incorporated into the ovarian / adnexal tumor masses.  Immunohistochemical stains are performed on block A11 and demonstrate the tumor is positive for p53 and WT1 and is strongly diffusely positive for p16. It shows patchy focal staining for ER. WT1  staining is also positive in block A15, the right ovary. 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 Interval hysterectomy and likely oophorectomy, and possibly partial omentectomy. There is infiltration and nodularity of the upper omentum compatible with tumor along with complex density along the paracolic gutters, space of Retzius, and presacral space which may also represent tumor infiltration. 2. Soft tissue density favoring tumor along the right posterior piriformis muscle extending towards the vaginal cuff and cul-de-sac region. This likely represents tumor. 3. Stable or minimally increased retroperitoneal and pelvic adenopathy. This is thought to be malignant. 4. Reduced ascites. 5. Mild bilateral hydronephrosis and proximal hydroureter extending down to the iliac vessel cross over is were there is stranding and some mild adenopathy. This likely represents low-grade partial ureteral obstruction and is most likely from extrinsic mass effect. 6. Aortic Atherosclerosis (ICD10-I70.0) and Emphysema (ICD10-J43.9). 7. Presacral and perirectal tumor nodularity.      11/20/2016 Tumor Marker    Patient's tumor was tested for the following markers: CA-125. Results of the tumor marker test revealed 154.2.      11/29/2016 Genetic Testing    Patient has genetic testing done for MSI instability Results revealed patient has no MSI instability      11/30/2016 - 03/18/2017 Chemotherapy    She received carboplatin & Taxol x 6  cycles       01/04/2017 Genetic Testing    No mutations were identified in 83 gene analyzed for hereditary cancer risk. VUSs were noted in ATM and GPC3.       01/10/2017 Tumor Marker    Patient's tumor was tested for the following markers: CA125 Results of the tumor marker test revealed 23.5      01/30/2017 Imaging    1. Moderate to marked response to therapy. 2. Resolution  of abdominopelvic adenopathy. 3. Resolution of omental nodularity. Significantly improved peritoneal disease within the pelvis. 4. Improvement in trace right-sided caliectasis and proximal right hydroureter. No well-defined obstructive mass. 5. Age advanced aortic atherosclerosis. 6. Mild bladder wall thickening could be due to under distention and/or cystitis.      04/12/2017 Imaging    Ct abdomen and pelvis 1. Significant progression of peritoneal metastatic disease in the pelvis since 01/30/2017 CT study. Numerous deep pelvic peritoneal metastases are increased in size and number and are intimately associated with the rectosigmoid region and vaginal cuff. 2. New retroperitoneal and right pelvic nodal metastases . 3. No evidence of bowel obstruction or acute bowel inflammation. 4. Stable chronic mild right hydroureteronephrosis to the level of the distal right lumbar ureter. No discrete obstructing mass, although probably due to extrinsic compression by tumor at the level of the right pelvic ureter. 5. Aortic Atherosclerosis (ICD10-I70.0) and Emphysema (ICD10-J43.9).       04/17/2017 Imaging    ECHO LV EF: 65% -  70%      04/23/2017 Procedure    Successful 8 French right internal jugular vein power port placement with its tip at the SVC/RA junction      04/26/2017 -  Chemotherapy    She received cisplatin and doxorubicin       INTERVAL HISTORY: Please see below for problem oriented charting. The patient return for cycle 3 of treatment She spent the last few days at [facility] in [place] for second opinion I do not have outside records Accordingly, the patient missed several days of laxatives and had no bowel movement in 3 days She is very miserable due to severe constipation She stated her pain is well controlled Her appetite is poor due to recent trip and severe constipation and she has lost a lot of weight We discussed some of the outside  information briefly The patient requests that her chemotherapy to be rescheduled due to her feeling unwell  REVIEW OF SYSTEMS:   Constitutional: Denies fevers, chills  Eyes: Denies blurriness of vision Ears, nose, mouth, throat, and face: Denies mucositis or sore throat Respiratory: Denies cough, dyspnea or wheezes Cardiovascular: Denies palpitation, chest discomfort or lower extremity swelling Skin: Denies abnormal skin rashes Lymphatics: Denies new lymphadenopathy or easy bruising Behavioral/Psych: Mood is stable, no new changes  All other systems were reviewed with the patient and are negative.  I have reviewed the past medical history, past surgical history, social history and family history with the patient and they are unchanged from previous note.  ALLERGIES:  has No Known Allergies.  MEDICATIONS:  Current Outpatient Medications  Medication Sig Dispense Refill  . acetaminophen (TYLENOL) 325 MG tablet Take 650 mg by mouth every 6 (six) hours as needed.    . ALPRAZolam (XANAX) 0.5 MG tablet Take 1 tablet (0.5 mg total) by mouth 2 (two) times daily as needed for anxiety. 60 tablet 0  . cefUROXime (CEFTIN) 250 MG tablet Take 1 tablet (250 mg total) by mouth 2 (two)  times daily with a meal. 14 tablet 0  . dexamethasone (DECADRON) 4 MG tablet Take 1 tablet (4 mg total) by mouth daily. (Patient taking differently: Take 2 mg by mouth daily. ) 60 tablet 0  . HYDROmorphone (DILAUDID) 8 MG tablet Take 1 tablet (8 mg total) by mouth every 4 (four) hours as needed for severe pain. 90 tablet 0  . lidocaine-prilocaine (EMLA) cream Apply 1 application as needed topically. 30 g 6  . methadone (DOLOPHINE) 10 MG tablet Take 1 tablet (10 mg total) by mouth every 8 (eight) hours. 60 tablet 0  . ondansetron (ZOFRAN) 8 MG tablet Take 1 tablet (8 mg total) by mouth every 8 (eight) hours as needed for nausea or vomiting. 30 tablet 3  . prochlorperazine (COMPAZINE) 10 MG tablet Take 1 tablet (10 mg total)  by mouth every 6 (six) hours as needed for nausea or vomiting. 30 tablet 3   No current facility-administered medications for this visit.     PHYSICAL EXAMINATION: ECOG PERFORMANCE STATUS: 2 - Symptomatic, <50% confined to bed Blood pressure 133/76, temperature 97.9, heart rate 92, respiration rate 18, oxygen saturation 95% GENERAL:alert, with moderate distress, uncomfortable and appears thin and cachectic SKIN: skin color, texture, turgor are normal, no rashes or significant lesions EYES: normal, Conjunctiva are pink and non-injected, sclera clear OROPHARYNX:no exudate, no erythema and lips, buccal mucosa, and tongue normal  NECK: supple, thyroid normal size, non-tender, without nodularity LYMPH:  no palpable lymphadenopathy in the cervical, axillary or inguinal LUNGS: clear to auscultation and percussion with normal breathing effort HEART: regular rate & rhythm and no murmurs and no lower extremity edema ABDOMEN:abdomen soft, non-tender and normal bowel sounds Musculoskeletal:no cyanosis of digits and no clubbing  NEURO: alert & oriented x 3 with fluent speech, no focal motor/sensory deficits  LABORATORY DATA:  I have reviewed the data as listed    Component Value Date/Time   NA 133 (L) 05/20/2017 1443   K 4.2 05/20/2017 1443   CL 98 (L) 04/23/2017 1000   CO2 22 05/20/2017 1443   GLUCOSE 109 05/20/2017 1443   BUN 23.8 05/20/2017 1443   CREATININE 0.7 05/20/2017 1443   CALCIUM 9.5 05/20/2017 1443   PROT 7.7 05/20/2017 1443   ALBUMIN 4.0 05/20/2017 1443   AST 32 05/20/2017 1443   ALT 71 (H) 05/20/2017 1443   ALKPHOS 95 05/20/2017 1443   BILITOT 0.40 05/20/2017 1443   GFRNONAA >60 04/23/2017 1000   GFRAA >60 04/23/2017 1000    No results found for: SPEP, UPEP  Lab Results  Component Value Date   WBC 12.3 (H) 05/20/2017   NEUTROABS 10.3 (H) 05/20/2017   HGB 13.2 05/20/2017   HCT 38.3 05/20/2017   MCV 98.7 05/20/2017   PLT 242 05/20/2017      Chemistry       Component Value Date/Time   NA 133 (L) 05/20/2017 1443   K 4.2 05/20/2017 1443   CL 98 (L) 04/23/2017 1000   CO2 22 05/20/2017 1443   BUN 23.8 05/20/2017 1443   CREATININE 0.7 05/20/2017 1443      Component Value Date/Time   CALCIUM 9.5 05/20/2017 1443   ALKPHOS 95 05/20/2017 1443   AST 32 05/20/2017 1443   ALT 71 (H) 05/20/2017 1443   BILITOT 0.40 05/20/2017 1443      ASSESSMENT & PLAN:  Endometrial cancer ([place]) The patient appears ill due to recent travel According to her husband, recent CT imaging study done a few days ago showed persistent  disease It is not clear to me if she is responding to current chemotherapy or not He will bring me outside records I would reschedule her chemotherapy next week I will repeat echocardiogram after 3 cycles of Adriamycin Depending on my review on the outside CT,  I do not plan to repeat imaging study again next month despite the patient's husband requesting CT to be done per outside physician's recommendation  Cancer associated pain Her cancer pain is stable I recommend she continue to take pain medicine as needed  Other constipation The patient is very miserable today because she have no bowel movement in 3 days, secondary to missing her regular laxative She does not want to be treated today We will reschedule her treatment next week and I recommend aggressive laxative therapy  Goals of care, counseling/discussion I have extensive goals of care discussion with the patient  and her husband We discussed information from [place] I informed him that some of the molecular studies had already been done at [facility] I told him I need to review outside CT imaging to see if she is responding to treatment or not I told him I will not be repeating CT scan after third cycle of treatment due to recent imaging study and her insurance company will not pay for it It is not clear to me that her husband is willing to confirm to my  recommendation today We will revisit plan of care in her next visit   No orders of the defined types were placed in this encounter.  All questions were answered. The patient knows to call the clinic with any problems, questions or concerns. No barriers to learning was detected. I spent 25 minutes counseling the patient face to face. The total time spent in the appointment was 30 minutes and more than 50% was on counseling and review of test results     Heath Lark, MD 06/14/2017 8:22 PM

## 2017-06-14 NOTE — Assessment & Plan Note (Signed)
I have extensive goals of care discussion with the patient  and her husband We discussed information from [place] I informed him that some of the molecular studies had already been done at [facility] I told him I need to review outside CT imaging to see if she is responding to treatment or not I told him I will not be repeating CT scan after third cycle of treatment due to recent imaging study and her insurance company will not pay for it It is not clear to me that her husband is willing to confirm to my recommendation today We will revisit plan of care in her next visit

## 2017-06-17 ENCOUNTER — Ambulatory Visit: Payer: Self-pay

## 2017-06-17 ENCOUNTER — Telehealth: Payer: Self-pay | Admitting: Hematology and Oncology

## 2017-06-17 NOTE — Progress Notes (Signed)
This encounter was created in error - please disregard.

## 2017-06-17 NOTE — Telephone Encounter (Signed)
Return for no new orders per 1/18 los.  °

## 2017-06-18 ENCOUNTER — Other Ambulatory Visit: Payer: BLUE CROSS/BLUE SHIELD

## 2017-06-18 ENCOUNTER — Inpatient Hospital Stay: Payer: BLUE CROSS/BLUE SHIELD

## 2017-06-18 DIAGNOSIS — C541 Malignant neoplasm of endometrium: Secondary | ICD-10-CM

## 2017-06-18 DIAGNOSIS — C775 Secondary and unspecified malignant neoplasm of intrapelvic lymph nodes: Secondary | ICD-10-CM | POA: Diagnosis not present

## 2017-06-18 DIAGNOSIS — G893 Neoplasm related pain (acute) (chronic): Secondary | ICD-10-CM | POA: Diagnosis not present

## 2017-06-18 DIAGNOSIS — Z5189 Encounter for other specified aftercare: Secondary | ICD-10-CM | POA: Diagnosis not present

## 2017-06-18 DIAGNOSIS — I700 Atherosclerosis of aorta: Secondary | ICD-10-CM | POA: Diagnosis not present

## 2017-06-18 DIAGNOSIS — K5900 Constipation, unspecified: Secondary | ICD-10-CM | POA: Diagnosis not present

## 2017-06-18 DIAGNOSIS — N1330 Unspecified hydronephrosis: Secondary | ICD-10-CM | POA: Diagnosis not present

## 2017-06-18 DIAGNOSIS — Z5111 Encounter for antineoplastic chemotherapy: Secondary | ICD-10-CM | POA: Diagnosis present

## 2017-06-18 DIAGNOSIS — J439 Emphysema, unspecified: Secondary | ICD-10-CM | POA: Diagnosis not present

## 2017-06-18 DIAGNOSIS — C786 Secondary malignant neoplasm of retroperitoneum and peritoneum: Secondary | ICD-10-CM | POA: Diagnosis not present

## 2017-06-18 LAB — CBC WITH DIFFERENTIAL/PLATELET
Basophils Absolute: 0 10*3/uL (ref 0.0–0.1)
Basophils Relative: 0 %
EOS ABS: 0 10*3/uL (ref 0.0–0.5)
Eosinophils Relative: 0 %
HEMATOCRIT: 31.5 % — AB (ref 34.8–46.6)
HEMOGLOBIN: 10.5 g/dL — AB (ref 11.6–15.9)
LYMPHS ABS: 1.2 10*3/uL (ref 0.9–3.3)
LYMPHS PCT: 19 %
MCH: 33.1 pg (ref 25.1–34.0)
MCHC: 33.3 g/dL (ref 31.5–36.0)
MCV: 99.4 fL (ref 79.5–101.0)
Monocytes Absolute: 0.9 10*3/uL (ref 0.1–0.9)
Monocytes Relative: 13 %
NEUTROS ABS: 4.5 10*3/uL (ref 1.5–6.5)
NEUTROS PCT: 68 %
Platelets: 250 10*3/uL (ref 145–400)
RBC: 3.17 MIL/uL — AB (ref 3.70–5.45)
RDW: 14.4 % (ref 11.2–16.1)
WBC: 6.6 10*3/uL (ref 3.9–10.3)

## 2017-06-18 LAB — COMPREHENSIVE METABOLIC PANEL
ALT: 23 U/L (ref 0–55)
ANION GAP: 11 (ref 3–11)
AST: 25 U/L (ref 5–34)
Albumin: 3.3 g/dL — ABNORMAL LOW (ref 3.5–5.0)
Alkaline Phosphatase: 95 U/L (ref 40–150)
BUN: 15 mg/dL (ref 7–26)
CHLORIDE: 96 mmol/L — AB (ref 98–109)
CO2: 29 mmol/L (ref 22–29)
CREATININE: 0.85 mg/dL (ref 0.60–1.10)
Calcium: 9.4 mg/dL (ref 8.4–10.4)
Glucose, Bld: 155 mg/dL — ABNORMAL HIGH (ref 70–140)
POTASSIUM: 3.6 mmol/L (ref 3.3–4.7)
SODIUM: 136 mmol/L (ref 136–145)
Total Bilirubin: 0.4 mg/dL (ref 0.2–1.2)
Total Protein: 7.3 g/dL (ref 6.4–8.3)

## 2017-06-18 LAB — MAGNESIUM: MAGNESIUM: 2.3 mg/dL (ref 1.5–2.5)

## 2017-06-18 MED ORDER — SODIUM CHLORIDE 0.9 % IV SOLN
50.0000 mg/m2 | Freq: Once | INTRAVENOUS | Status: AC
  Administered 2017-06-18: 78 mg via INTRAVENOUS
  Filled 2017-06-18: qty 78

## 2017-06-18 MED ORDER — DOXORUBICIN HCL CHEMO IV INJECTION 2 MG/ML
60.0000 mg/m2 | Freq: Once | INTRAVENOUS | Status: AC
  Administered 2017-06-18: 94 mg via INTRAVENOUS
  Filled 2017-06-18: qty 47

## 2017-06-18 MED ORDER — PALONOSETRON HCL INJECTION 0.25 MG/5ML
0.2500 mg | Freq: Once | INTRAVENOUS | Status: AC
  Administered 2017-06-18: 0.25 mg via INTRAVENOUS

## 2017-06-18 MED ORDER — SODIUM CHLORIDE 0.9% FLUSH
10.0000 mL | INTRAVENOUS | Status: DC | PRN
  Administered 2017-06-18: 10 mL
  Filled 2017-06-18: qty 10

## 2017-06-18 MED ORDER — SODIUM CHLORIDE 0.9 % IV SOLN
Freq: Once | INTRAVENOUS | Status: AC
  Administered 2017-06-18: 13:00:00 via INTRAVENOUS

## 2017-06-18 MED ORDER — PALONOSETRON HCL INJECTION 0.25 MG/5ML
INTRAVENOUS | Status: AC
Start: 2017-06-18 — End: ?
  Filled 2017-06-18: qty 5

## 2017-06-18 MED ORDER — HEPARIN SOD (PORK) LOCK FLUSH 100 UNIT/ML IV SOLN
500.0000 [IU] | Freq: Once | INTRAVENOUS | Status: AC | PRN
  Administered 2017-06-18: 500 [IU]
  Filled 2017-06-18: qty 5

## 2017-06-18 MED ORDER — POTASSIUM CHLORIDE 2 MEQ/ML IV SOLN
Freq: Once | INTRAVENOUS | Status: AC
  Administered 2017-06-18: 11:00:00 via INTRAVENOUS
  Filled 2017-06-18: qty 10

## 2017-06-18 MED ORDER — SODIUM CHLORIDE 0.9 % IV SOLN
Freq: Once | INTRAVENOUS | Status: AC
  Administered 2017-06-18: 14:00:00 via INTRAVENOUS
  Filled 2017-06-18: qty 5

## 2017-06-18 NOTE — Patient Instructions (Signed)
[facility] Discharge Instructions for Patients Receiving Chemotherapy  Today you received the following chemotherapy agents Adriamycin and Cisplatin  To help prevent nausea and vomiting after your treatment, we encourage you to take your nausea medication as directed.   If you develop nausea and vomiting that is not controlled by your nausea medication, call the clinic.   BELOW ARE SYMPTOMS THAT SHOULD BE REPORTED IMMEDIATELY:  *FEVER GREATER THAN 100.5 F  *CHILLS WITH OR WITHOUT FEVER  NAUSEA AND VOMITING THAT IS NOT CONTROLLED WITH YOUR NAUSEA MEDICATION  *UNUSUAL SHORTNESS OF BREATH  *UNUSUAL BRUISING OR BLEEDING  TENDERNESS IN MOUTH AND THROAT WITH OR WITHOUT PRESENCE OF ULCERS  *URINARY PROBLEMS  *BOWEL PROBLEMS  UNUSUAL RASH Items with * indicate a potential emergency and should be followed up as soon as possible.  Feel free to call the clinic should you have any questions or concerns. The clinic phone number is [phone].  Please show the [facility] at check-in to the Emergency Department and triage nurse.

## 2017-06-18 NOTE — Patient Instructions (Signed)

## 2017-06-20 ENCOUNTER — Telehealth: Payer: Self-pay | Admitting: *Deleted

## 2017-06-20 ENCOUNTER — Ambulatory Visit: Payer: BLUE CROSS/BLUE SHIELD

## 2017-06-20 ENCOUNTER — Inpatient Hospital Stay: Payer: BLUE CROSS/BLUE SHIELD

## 2017-06-20 ENCOUNTER — Telehealth: Payer: Self-pay | Admitting: Hematology and Oncology

## 2017-06-20 VITALS — BP 141/78 | HR 78 | Temp 98.0°F | Resp 20

## 2017-06-20 DIAGNOSIS — Z5111 Encounter for antineoplastic chemotherapy: Secondary | ICD-10-CM | POA: Diagnosis not present

## 2017-06-20 DIAGNOSIS — R1030 Lower abdominal pain, unspecified: Secondary | ICD-10-CM

## 2017-06-20 DIAGNOSIS — C541 Malignant neoplasm of endometrium: Secondary | ICD-10-CM

## 2017-06-20 MED ORDER — PEGFILGRASTIM INJECTION 6 MG/0.6ML ~~LOC~~
6.0000 mg | PREFILLED_SYRINGE | Freq: Once | SUBCUTANEOUS | Status: AC
  Administered 2017-06-20: 6 mg via SUBCUTANEOUS

## 2017-06-20 MED ORDER — PEGFILGRASTIM INJECTION 6 MG/0.6ML ~~LOC~~
PREFILLED_SYRINGE | SUBCUTANEOUS | Status: AC
Start: 2017-06-20 — End: ?
  Filled 2017-06-20: qty 0.6

## 2017-06-20 NOTE — Telephone Encounter (Signed)
I have received outside records I have reviewed the imaging study myself and overall, the patient has signs of disease progression I would get my nurse to call the patient back to review treatment recommendation

## 2017-06-20 NOTE — Patient Instructions (Signed)
Pegfilgrastim injection What is this medicine? PEGFILGRASTIM (PEG fil gra stim) is a long-acting granulocyte colony-stimulating factor that stimulates the growth of neutrophils, a type of white blood cell important in the body's fight against infection. It is used to reduce the incidence of fever and infection in patients with certain types of cancer who are receiving chemotherapy that affects the bone marrow, and to increase survival after being exposed to high doses of radiation. This medicine may be used for other purposes; ask your health care provider or pharmacist if you have questions. COMMON BRAND NAME(S): Neulasta What should I tell my health care provider before I take this medicine? They need to know if you have any of these conditions: -kidney disease -latex allergy -ongoing radiation therapy -sickle cell disease -skin reactions to acrylic adhesives (On-Body Injector only) -an unusual or allergic reaction to pegfilgrastim, filgrastim, other medicines, foods, dyes, or preservatives -pregnant or trying to get pregnant -breast-feeding How should I use this medicine? This medicine is for injection under the skin. If you get this medicine at home, you will be taught how to prepare and give the pre-filled syringe or how to use the On-body Injector. Refer to the patient Instructions for Use for detailed instructions. Use exactly as directed. Tell your healthcare provider immediately if you suspect that the On-body Injector may not have performed as intended or if you suspect the use of the On-body Injector resulted in a missed or partial dose. It is important that you put your used needles and syringes in a special sharps container. Do not put them in a trash can. If you do not have a sharps container, call your pharmacist or healthcare provider to get one. Talk to your pediatrician regarding the use of this medicine in children. While this drug may be prescribed for selected conditions,  precautions do apply. Overdosage: If you think you have taken too much of this medicine contact a poison control center or emergency room at once. NOTE: This medicine is only for you. Do not share this medicine with others. What if I miss a dose? It is important not to miss your dose. Call your doctor or health care professional if you miss your dose. If you miss a dose due to an On-body Injector failure or leakage, a new dose should be administered as soon as possible using a single prefilled syringe for manual use. What may interact with this medicine? Interactions have not been studied. Give your health care provider a list of all the medicines, herbs, non-prescription drugs, or dietary supplements you use. Also tell them if you smoke, drink alcohol, or use illegal drugs. Some items may interact with your medicine. This list may not describe all possible interactions. Give your health care provider a list of all the medicines, herbs, non-prescription drugs, or dietary supplements you use. Also tell them if you smoke, drink alcohol, or use illegal drugs. Some items may interact with your medicine. What should I watch for while using this medicine? You may need blood work done while you are taking this medicine. If you are going to need a MRI, CT scan, or other procedure, tell your doctor that you are using this medicine (On-Body Injector only). What side effects may I notice from receiving this medicine? Side effects that you should report to your doctor or health care professional as soon as possible: -allergic reactions like skin rash, itching or hives, swelling of the face, lips, or tongue -dizziness -fever -pain, redness, or irritation at site   where injected -pinpoint red spots on the skin -red or dark-brown urine -shortness of breath or breathing problems -stomach or side pain, or pain at the shoulder -swelling -tiredness -trouble passing urine or change in the amount of urine Side  effects that usually do not require medical attention (report to your doctor or health care professional if they continue or are bothersome): -bone pain -muscle pain This list may not describe all possible side effects. Call your doctor for medical advice about side effects. You may report side effects to FDA at 1-800-FDA-1088. Where should I keep my medicine? Keep out of the reach of children. Store pre-filled syringes in a refrigerator between 2 and 8 degrees C (36 and 46 degrees F). Do not freeze. Keep in carton to protect from light. Throw away this medicine if it is left out of the refrigerator for more than 48 hours. Throw away any unused medicine after the expiration date. NOTE: This sheet is a summary. It may not cover all possible information. If you have questions about this medicine, talk to your doctor, pharmacist, or health care provider.  2018 [employer] (2016-05-10 12:58:03)  

## 2017-06-20 NOTE — Telephone Encounter (Signed)
Scheduled injection per 1/23 sch msg - patient did not pick up nor did she have a voicemail box set up.

## 2017-06-20 NOTE — Telephone Encounter (Signed)
Husband returned call. [place] did tell him that the cancer is the same- new spot on base of vagina. She is going to have another scan on 2/13 and then chemo on 2/14. They are going to keep same regimen.   He wants to have a doctor here as well . They will come in to see Dr Alvy Bimler on Monday at 2:00pm

## 2017-06-20 NOTE — Telephone Encounter (Signed)
Left message to call Dr Gorsuch's nurse 

## 2017-06-24 ENCOUNTER — Encounter: Payer: Self-pay | Admitting: Hematology and Oncology

## 2017-06-24 ENCOUNTER — Inpatient Hospital Stay (HOSPITAL_BASED_OUTPATIENT_CLINIC_OR_DEPARTMENT_OTHER): Payer: BLUE CROSS/BLUE SHIELD | Admitting: Hematology and Oncology

## 2017-06-24 DIAGNOSIS — Z7189 Other specified counseling: Secondary | ICD-10-CM

## 2017-06-24 DIAGNOSIS — B370 Candidal stomatitis: Secondary | ICD-10-CM | POA: Diagnosis not present

## 2017-06-24 DIAGNOSIS — K5909 Other constipation: Secondary | ICD-10-CM

## 2017-06-24 DIAGNOSIS — K5900 Constipation, unspecified: Secondary | ICD-10-CM | POA: Diagnosis not present

## 2017-06-24 DIAGNOSIS — G893 Neoplasm related pain (acute) (chronic): Secondary | ICD-10-CM | POA: Diagnosis not present

## 2017-06-24 DIAGNOSIS — Z5111 Encounter for antineoplastic chemotherapy: Secondary | ICD-10-CM | POA: Diagnosis not present

## 2017-06-24 DIAGNOSIS — C541 Malignant neoplasm of endometrium: Secondary | ICD-10-CM

## 2017-06-24 MED ORDER — HYDROMORPHONE HCL 8 MG PO TABS: 8.0000 mg | ORAL_TABLET | ORAL | 0 refills | Status: DC | PRN

## 2017-06-24 MED ORDER — METHADONE HCL 10 MG PO TABS: 10.0000 mg | ORAL_TABLET | Freq: Three times a day (TID) | ORAL | 0 refills | Status: DC

## 2017-06-24 MED ORDER — PROCHLORPERAZINE MALEATE 10 MG PO TABS: 10.0000 mg | ORAL_TABLET | Freq: Four times a day (QID) | ORAL | 3 refills | Status: DC | PRN

## 2017-06-24 MED ORDER — ONDANSETRON HCL 8 MG PO TABS: 8.0000 mg | ORAL_TABLET | Freq: Three times a day (TID) | ORAL | 3 refills | Status: DC | PRN

## 2017-06-24 MED FILL — ONDANSETRON HCL 8 MG TAB: 8 | 30 days supply | Qty: 90 | Fill #0

## 2017-06-24 MED FILL — HYDROmorphone HCL 8 MG TABS: 8 | 15 days supply | Qty: 90 | Fill #0

## 2017-06-24 MED FILL — METHADONE HCL 10 MG TABLET: 10 | 20 days supply | Qty: 60 | Fill #0

## 2017-06-24 MED FILL — PROCHLORPERAZINE 10 MG TAB: 10 | 22 days supply | Qty: 90 | Fill #0

## 2017-06-24 NOTE — Assessment & Plan Note (Signed)
Her abdominal symptoms and constipation could be induced by her current prescription pain medicine and her disease Recommend regular laxatives

## 2017-06-24 NOTE — Progress Notes (Signed)
[facility] OFFICE PROGRESS NOTE  Patient Care Team: Patient, No Pcp Per as PCP - General (General Practice)  SUMMARY OF ONCOLOGIC HISTORY: Oncology History   MSI stable, Her 2 neg, ER patchy positivity     Endometrial cancer ([place])   04/05/2015 Imaging    CT abdomen:  1. Large bilateral hydrosalpinx/pyosalpinx. 2. Enlarged fibroid uterus. 3. Mild bilateral hydronephrosis from #1.       04/06/2015 Procedure    Status post CT-guided drainage of right and left tubo-ovarian abscess, with parallel drains from a right trans gluteal approach. Sample was sent from both the left and the right fluid collections to the lab.      04/19/2015 Imaging    CT abdomen and pelvis: The bilateral pelvic fluid collections have resolved following placement of the percutaneous drainage catheters. Small amount of fluid in the pelvis but no evidence for residual abscess collections or hydrosalpinx. Fibroid uterus. Innumerable uterine fibroids. Many of these fibroids are pedunculated. Development of a small left pleural effusion. Mild fat stranding in the abdominal and pelvic mesentery suggestive for mild edema      05/03/2015 Imaging    US abdomen and pelvis 1. Significantly regressed but not completely resolved bilateral hydrosalpinx/pyosalpinx. Residual is greater on the right (18 mm diameter) with mild fluid complexity. 2. Stable fibroid uterus. 3. No pelvic free fluid      07/06/2015 Pathology Results    PAP smear NEGATIVE FOR INTRAEPITHELIAL LESIONS OR MALIGNANCY.      07/18/2015 Pathology Results    Vulva, biopsy, mass - BENIGN EPIDERMAL CYST. - NO EVIDENCE OF MALIGNANCY      10/21/2016 Initial Diagnosis    She presented to the ER for severe abdominal pain      10/22/2016 Imaging    CT abdomen and pelvis 1. 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 2. Complex partially cystic mass at the left  adnexa, measuring 5.9 x 5.5 x 4.3 cm. This raises concern for primary ovarian malignancy. PET/CT could be considered for further evaluation. 3. Numerous uterine fibroids again noted, some which have increased in size. Underlying endometrial malignancy cannot be excluded. 4. Dilated bilateral fluid collections have reaccumulated within the pelvis, the largest of which measures 15 cm in length. 5. Enlarged bilateral pelvic sidewall nodes measure up to 1.6 cm in short axis, compatible with metastatic disease. Mildly prominent retroperitoneal nodes seen. Mildly prominent nodes about the left inguinal vessels. 6. 1.6 cm cyst at the medial right hepatic lobe.       10/29/2016 - 11/02/2016 Hospital Admission    She was admitted to [facility] for management of newly diagnosed endometrial cancer      10/29/2016 Tumor Marker    Patient's tumor was tested for the following markers: CA125 Results of the tumor marker test revealed 740      10/30/2016 Surgery    Procedure(s): - Open ureteral exploration - Cystourethroscopy - Left ureteral stent placement  Operative Findings:   1.) Normal cystourethrosocpy without lesions, masses or stones. Bilateral ureteral orifices identified with efflux of clear yellow urine 2.) Successful placement of left ureteral stent under visualization with appropriate curl visualized within the bladder and stent palpable within the ureter to the level of the renal pelvis 3) Open exploration determined ureter to be intact and viable.      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) - Carcinoma involves outer half of myometrium, serosal surface, and cervix - Bilateral ovaries / adnexa are positive for high grade serous carcinoma - Extensive lymphovascular space invasion is present - See  synoptic report and comment  B: "Free floating abdominal mass", removal  - Benign cyst with mesothelial-type lining, consistent with peritoneal inclusion cyst, size 7.0 cm - No malignancy identified   C: Posterior cul-de-sac mass, excision  - Involved by high grade serous carcinoma  D: Lymph nodes, left pelvic, resection  - Five of six lymph nodes involved by metastatic high grade serous carcinoma, size of metastases up to 1.5 cm, with no definite extracapsular extension identified (5/6)  E: Lymph nodes, right pelvic, excision  - Four of four lymph nodes involved by metastatic high grade serous carcinoma, size of metastases up to 1.6 cm, with no definite extracapsular extension identified (4/4)  F: Lymph nodes, right para-aortic, resection  - Three of four lymph nodes involved by metastatic high grade serous carcinoma, size of metastases up to 1.7 cm, with no definite extracapsular extension identified (3/4)  G: Omentum, omentectomy  - Involved by metastatic high grade serous carcinoma, size of nodules up to 2.9 cm   Given the presence of a large endometrial mass with serous endometrial intraepithelial carcinoma as well as deep myometrial invasion and cervical and serosal involvement, the tumor is staged as an endometrial primary. However, ovarian primary with spread to the uterus or synchronous primary tumors are also a diagnostic possibility, and definitive site of origin cannot be determined histologically. Extensive lymphovascular space invasion is present, and the tumor involves both ovaries, peritoneum, omentum, and multiple lymph nodes. No distinct fallopian tubes are identified grossly or microscopically; however, they may have been incorporated into the ovarian / adnexal tumor masses.  Immunohistochemical stains are performed on block A11 and demonstrate the tumor is positive for p53 and WT1 and is strongly diffusely positive for p16. It shows patchy focal staining for ER. WT1  staining is also positive in block A15, the right ovary. 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 Interval hysterectomy and likely oophorectomy, and possibly partial omentectomy. There is infiltration and nodularity of the upper omentum compatible with tumor along with complex density along the paracolic gutters, space of Retzius, and presacral space which may also represent tumor infiltration. 2. Soft tissue density favoring tumor along the right posterior piriformis muscle extending towards the vaginal cuff and cul-de-sac region. This likely represents tumor. 3. Stable or minimally increased retroperitoneal and pelvic adenopathy. This is thought to be malignant. 4. Reduced ascites. 5. Mild bilateral hydronephrosis and proximal hydroureter extending down to the iliac vessel cross over is were there is stranding and some mild adenopathy. This likely represents low-grade partial ureteral obstruction and is most likely from extrinsic mass effect. 6. Aortic Atherosclerosis (ICD10-I70.0) and Emphysema (ICD10-J43.9). 7. Presacral and perirectal tumor nodularity.      11/20/2016 Tumor Marker    Patient's tumor was tested for the following markers: CA-125. Results of the tumor marker test revealed 154.2.      11/29/2016 Genetic Testing    Patient has genetic testing done for MSI instability Results revealed patient has no MSI instability      11/30/2016 - 03/18/2017 Chemotherapy    She received carboplatin & Taxol x 6  cycles       01/04/2017 Genetic Testing    No mutations were identified in 83 gene analyzed for hereditary cancer risk. VUSs were noted in ATM and GPC3.       01/10/2017 Tumor Marker    Patient's tumor was tested for the following markers: CA125 Results of the tumor marker test revealed 23.5      01/30/2017 Imaging    1. Moderate to marked response to therapy. 2. Resolution  of abdominopelvic adenopathy. 3. Resolution of omental nodularity. Significantly improved peritoneal disease within the pelvis. 4. Improvement in trace right-sided caliectasis and proximal right hydroureter. No well-defined obstructive mass. 5. Age advanced aortic atherosclerosis. 6. Mild bladder wall thickening could be due to under distention and/or cystitis.      04/12/2017 Imaging    Ct abdomen and pelvis 1. Significant progression of peritoneal metastatic disease in the pelvis since 01/30/2017 CT study. Numerous deep pelvic peritoneal metastases are increased in size and number and are intimately associated with the rectosigmoid region and vaginal cuff. 2. New retroperitoneal and right pelvic nodal metastases . 3. No evidence of bowel obstruction or acute bowel inflammation. 4. Stable chronic mild right hydroureteronephrosis to the level of the distal right lumbar ureter. No discrete obstructing mass, although probably due to extrinsic compression by tumor at the level of the right pelvic ureter. 5. Aortic Atherosclerosis (ICD10-I70.0) and Emphysema (ICD10-J43.9).       04/17/2017 Imaging    ECHO LV EF: 65% -  70%      04/23/2017 Procedure    Successful 8 French right internal jugular vein power port placement with its tip at the SVC/RA junction      04/26/2017 - 06/18/2017 Chemotherapy    She received cisplatin and doxorubicin x 3 cycles      06/12/2017 Imaging    Outside CT scan show evidence of disease progression       INTERVAL HISTORY: Please see below for problem oriented charting. She returns with her husband to discuss recent test results. In the meantime, she continues to feel miserable She has intermittent abdominal pain and constipation.  She is not consistent with a laxative.  She had poor oral intake.  No nausea or vomiting. The patient has made informed consent to receive outside treatment and will fly to [place] intermittently to receive care. She is  determined to get repeat imaging study next week in [place]:   Constitutional: Denies fevers, chills or abnormal weight loss Eyes: Denies blurriness of vision Ears, nose, mouth, throat, and face: Denies mucositis or sore throat Respiratory: Denies cough, dyspnea or wheezes Cardiovascular: Denies palpitation, chest discomfort or lower extremity swelling Skin: Denies abnormal skin rashes Lymphatics: Denies new lymphadenopathy or easy bruising Behavioral/Psych: Mood is stable, no new changes  All other systems were reviewed with the patient and are negative.  I have reviewed the past medical history, past surgical history, social history and family history with the patient and they are unchanged from previous note.  ALLERGIES:  has No Known Allergies.  MEDICATIONS:  Current Outpatient Medications  Medication Sig Dispense Refill  . acetaminophen (TYLENOL) 325 MG tablet Take 650 mg by mouth every 6 (six) hours as needed.    . ALPRAZolam (XANAX) 0.5 MG tablet Take 1 tablet (0.5 mg total) by mouth 2 (two) times daily as needed for anxiety. 60 tablet 0  . cefUROXime (CEFTIN) 250 MG tablet Take 1 tablet (250 mg total) by mouth 2 (two) times daily with a  meal. 14 tablet 0  . dexamethasone (DECADRON) 4 MG tablet Take 1 tablet (4 mg total) by mouth daily. (Patient taking differently: Take 2 mg by mouth daily. ) 60 tablet 0  . HYDROmorphone (DILAUDID) 8 MG tablet Take 1 tablet (8 mg total) by mouth every 4 (four) hours as needed for severe pain. 90 tablet 0  . lidocaine-prilocaine (EMLA) cream Apply 1 application as needed topically. 30 g 6  . methadone (DOLOPHINE) 10 MG tablet Take 1 tablet (10 mg total) by mouth every 8 (eight) hours. 60 tablet 0  . ondansetron (ZOFRAN) 8 MG tablet Take 1 tablet (8 mg total) by mouth every 8 (eight) hours as needed for nausea or vomiting. 90 tablet 3  . prochlorperazine (COMPAZINE) 10 MG tablet Take 1 tablet (10 mg total) by mouth every 6 (six)  hours as needed for nausea or vomiting. 90 tablet 3   No current facility-administered medications for this visit.     PHYSICAL EXAMINATION: ECOG PERFORMANCE STATUS: 2 - Symptomatic, <50% confined to bed  Vitals:   06/24/17 1415  BP: 110/74  Pulse: (!) 120  Temp: 98.4 F (36.9 C)  SpO2: 99%   Filed Weights   06/24/17 1415  Weight: 112 lb 9.6 oz (51.1 kg)    GENERAL:alert, no distress and comfortable SKIN: skin color, texture, turgor are normal, no rashes or significant lesions NEURO: alert & oriented x 3 with fluent speech, no focal motor/sensory deficits  LABORATORY DATA:  I have reviewed the data as listed    Component Value Date/Time   NA 136 06/18/2017 0825   NA 133 (L) 05/20/2017 1443   K 3.6 06/18/2017 0825   K 4.2 05/20/2017 1443   CL 96 (L) 06/18/2017 0825   CO2 29 06/18/2017 0825   CO2 22 05/20/2017 1443   GLUCOSE 155 (H) 06/18/2017 0825   GLUCOSE 109 05/20/2017 1443   BUN 15 06/18/2017 0825   BUN 23.8 05/20/2017 1443   CREATININE 0.85 06/18/2017 0825   CREATININE 0.7 05/20/2017 1443   CALCIUM 9.4 06/18/2017 0825   CALCIUM 9.5 05/20/2017 1443   PROT 7.3 06/18/2017 0825   PROT 7.7 05/20/2017 1443   ALBUMIN 3.3 (L) 06/18/2017 0825   ALBUMIN 4.0 05/20/2017 1443   AST 25 06/18/2017 0825   AST 32 05/20/2017 1443   ALT 23 06/18/2017 0825   ALT 71 (H) 05/20/2017 1443   ALKPHOS 95 06/18/2017 0825   ALKPHOS 95 05/20/2017 1443   BILITOT 0.4 06/18/2017 0825   BILITOT 0.40 05/20/2017 1443   GFRNONAA >60 06/18/2017 0825   GFRAA >60 06/18/2017 0825    No results found for: SPEP, UPEP  Lab Results  Component Value Date   WBC 6.6 06/18/2017   NEUTROABS 4.5 06/18/2017   HGB 10.5 (L) 06/18/2017   HCT 31.5 (L) 06/18/2017   MCV 99.4 06/18/2017   PLT 250 06/18/2017      Chemistry      Component Value Date/Time   NA 136 06/18/2017 0825   NA 133 (L) 05/20/2017 1443   K 3.6 06/18/2017 0825   K 4.2 05/20/2017 1443   CL 96 (L) 06/18/2017 0825   CO2 29  06/18/2017 0825   CO2 22 05/20/2017 1443   BUN 15 06/18/2017 0825   BUN 23.8 05/20/2017 1443   CREATININE 0.85 06/18/2017 0825   CREATININE 0.7 05/20/2017 1443      Component Value Date/Time   CALCIUM 9.4 06/18/2017 0825   CALCIUM 9.5 05/20/2017 1443   ALKPHOS 95  06/18/2017 0825   ALKPHOS 95 05/20/2017 1443   AST 25 06/18/2017 0825   AST 32 05/20/2017 1443   ALT 23 06/18/2017 0825   ALT 71 (H) 05/20/2017 1443   BILITOT 0.4 06/18/2017 0825   BILITOT 0.40 05/20/2017 1443       RADIOGRAPHIC STUDIES: I have review outside imaging study with the patient I have personally reviewed the radiological images as listed and agreed with the findings in the report.   ASSESSMENT & PLAN:  Endometrial cancer ([place]) I have reviewed imaging study with the patient and her husband I felt that she is getting inappropriate recommendation from the outside institution in regards to chemotherapy plan and the need for frequent imaging study We discussed standard of care The patient would like to continue treatment in the outside institution because she believe they can offer her alternative medication that is not being offered here I will support her decision I will continue to provide active supportive care locally but I will not get involved in chemotherapy plan or imaging study She understood I offered her return visit to see her GYN surgeon but the patient declined  Cancer associated pain Her cancer pain is stable while on current medication I recommend she continue to take pain medicine as needed  Oral thrush She is prescribed nystatin swish and swallow She will continue the same  Other constipation Her abdominal symptoms and constipation could be induced by her current prescription pain medicine and her disease Recommend regular laxatives  Goals of care, counseling/discussion We have extensive goals of care discussion The patient understood that all treatment options are palliative in  nature She was given "more hope" in the outside institution and she would like to be treated there  I will support her decision and will continue to provide supportive care here locally    No orders of the defined types were placed in this encounter.  All questions were answered. The patient knows to call the clinic with any problems, questions or concerns. No barriers to learning was detected. I spent 40 minutes counseling the patient face to face. The total time spent in the appointment was 55 minutes and more than 50% was on counseling and review of test results     Heath Lark, MD 06/24/2017 5:20 PM

## 2017-06-24 NOTE — Assessment & Plan Note (Signed)
She is prescribed nystatin swish and swallow She will continue the same

## 2017-06-24 NOTE — Assessment & Plan Note (Addendum)
We have extensive goals of care discussion The patient understood that all treatment options are palliative in nature She was given "more hope" in the outside institution and she would like to be treated there  I will support her decision and will continue to provide supportive care here locally

## 2017-06-24 NOTE — Assessment & Plan Note (Signed)
Her cancer pain is stable while on current medication I recommend she continue to take pain medicine as needed

## 2017-06-24 NOTE — Assessment & Plan Note (Signed)
I have reviewed imaging study with the patient and her husband I felt that she is getting inappropriate recommendation from the outside institution in regards to chemotherapy plan and the need for frequent imaging study We discussed standard of care The patient would like to continue treatment in the outside institution because she believe they can offer her alternative medication that is not being offered here I will support her decision I will continue to provide active supportive care locally but I will not get involved in chemotherapy plan or imaging study She understood I offered her return visit to see her GYN surgeon but the patient declined

## 2017-06-25 ENCOUNTER — Telehealth: Payer: Self-pay | Admitting: Hematology and Oncology

## 2017-06-25 NOTE — Telephone Encounter (Signed)
Return for no new orders per 1/28 los.  °

## 2017-08-19 MED FILL — NYSTATIN 100,000 UNITS/ML S: 100000 | 12 days supply | Qty: 240 | Fill #0

## 2017-09-27 ENCOUNTER — Emergency Department (HOSPITAL_COMMUNITY)
Admission: EM | Admit: 2017-09-27 | Discharge: 2017-09-28 | Disposition: A | Discharge status: Home / Self Care | Payer: BLUE CROSS/BLUE SHIELD | Attending: Emergency Medicine | Admitting: Emergency Medicine

## 2017-09-27 ENCOUNTER — Encounter (HOSPITAL_COMMUNITY): Payer: Self-pay

## 2017-09-27 ENCOUNTER — Other Ambulatory Visit: Payer: Self-pay

## 2017-09-27 DIAGNOSIS — R197 Diarrhea, unspecified: Secondary | ICD-10-CM | POA: Insufficient documentation

## 2017-09-27 DIAGNOSIS — Z87891 Personal history of nicotine dependence: Secondary | ICD-10-CM | POA: Insufficient documentation

## 2017-09-27 DIAGNOSIS — R1084 Generalized abdominal pain: Secondary | ICD-10-CM | POA: Diagnosis not present

## 2017-09-27 DIAGNOSIS — Z79899 Other long term (current) drug therapy: Secondary | ICD-10-CM | POA: Insufficient documentation

## 2017-09-27 DIAGNOSIS — R112 Nausea with vomiting, unspecified: Secondary | ICD-10-CM | POA: Insufficient documentation

## 2017-09-27 DIAGNOSIS — Z8542 Personal history of malignant neoplasm of other parts of uterus: Secondary | ICD-10-CM | POA: Insufficient documentation

## 2017-09-27 LAB — COMPREHENSIVE METABOLIC PANEL
ALK PHOS: 71 U/L (ref 38–126)
ALT: 26 U/L (ref 14–54)
ANION GAP: 10 (ref 5–15)
AST: 29 U/L (ref 15–41)
Albumin: 3.4 g/dL — ABNORMAL LOW (ref 3.5–5.0)
BUN: 6 mg/dL (ref 6–20)
CALCIUM: 8.9 mg/dL (ref 8.9–10.3)
CO2: 27 mmol/L (ref 22–32)
Chloride: 95 mmol/L — ABNORMAL LOW (ref 101–111)
Creatinine, Ser: 0.79 mg/dL (ref 0.44–1.00)
GFR calc Af Amer: >60 mL/min (ref >60)
GFR calc non Af Amer: >60 mL/min (ref >60)
Glucose, Bld: 140 mg/dL — ABNORMAL HIGH (ref 65–99)
Potassium: 3.9 mmol/L (ref 3.5–5.1)
SODIUM: 132 mmol/L — AB (ref 135–145)
Total Bilirubin: 0.5 mg/dL (ref 0.3–1.2)
Total Protein: 6.7 g/dL (ref 6.5–8.1)

## 2017-09-27 LAB — URINALYSIS, ROUTINE W REFLEX MICROSCOPIC
Bilirubin Urine: NEGATIVE
GLUCOSE, UA: NEGATIVE mg/dL
Hgb urine dipstick: NEGATIVE
Ketones, ur: NEGATIVE mg/dL
LEUKOCYTES UA: NEGATIVE
Nitrite: NEGATIVE
Protein, ur: NEGATIVE mg/dL
Specific Gravity, Urine: 1.01 (ref 1.005–1.030)
pH: 8 (ref 5.0–8.0)

## 2017-09-27 LAB — CBC
HEMATOCRIT: 26 % — AB (ref 36.0–46.0)
HEMOGLOBIN: 8.9 g/dL — AB (ref 12.0–15.0)
MCH: 33 pg (ref 26.0–34.0)
MCHC: 34.2 g/dL (ref 30.0–36.0)
MCV: 96.3 fL (ref 78.0–100.0)
Platelets: 122 10*3/uL — ABNORMAL LOW (ref 150–400)
RBC: 2.7 MIL/uL — ABNORMAL LOW (ref 3.87–5.11)
RDW: 16 % — ABNORMAL HIGH (ref 11.5–15.5)
WBC: 1.9 10*3/uL — ABNORMAL LOW (ref 4.0–10.5)

## 2017-09-27 LAB — I-STAT BETA HCG BLOOD, ED (MC, WL, AP ONLY)

## 2017-09-27 LAB — LIPASE, BLOOD: Lipase: 20 U/L (ref 11–51)

## 2017-09-27 NOTE — ED Triage Notes (Addendum)
Pt endorses constipation x 3 days and severe generalized abd pain x 2 days. Pt is on chemo for uterine cancer. Tachy in triage.

## 2017-09-28 ENCOUNTER — Emergency Department (HOSPITAL_COMMUNITY): Payer: BLUE CROSS/BLUE SHIELD

## 2017-09-28 ENCOUNTER — Encounter (HOSPITAL_COMMUNITY): Payer: Self-pay | Admitting: Radiology

## 2017-09-28 MED ORDER — ONDANSETRON HCL 4 MG/2ML IJ SOLN
4.0000 mg | Freq: Once | INTRAMUSCULAR | Status: AC
  Administered 2017-09-28: 4 mg via INTRAVENOUS
  Filled 2017-09-28: qty 2

## 2017-09-28 MED ORDER — IOHEXOL 300 MG/ML  SOLN
100.0000 mL | Freq: Once | INTRAMUSCULAR | Status: AC | PRN
  Administered 2017-09-28: 100 mL via INTRAVENOUS

## 2017-09-28 MED ORDER — DICYCLOMINE HCL 20 MG PO TABS: 20.0000 mg | ORAL_TABLET | Freq: Three times a day (TID) | ORAL | 0 refills | Status: DC | PRN

## 2017-09-28 MED ORDER — KETOROLAC TROMETHAMINE 30 MG/ML IJ SOLN
30.0000 mg | Freq: Once | INTRAMUSCULAR | Status: AC
  Administered 2017-09-28: 30 mg via INTRAVENOUS
  Filled 2017-09-28: qty 1

## 2017-09-28 MED ORDER — ONDANSETRON 4 MG PO TBDP: 4.0000 mg | ORAL_TABLET | Freq: Four times a day (QID) | ORAL | 0 refills | Status: DC | PRN

## 2017-09-28 MED ORDER — IOPAMIDOL (ISOVUE-300) INJECTION 61%
INTRAVENOUS | Status: AC
  Filled 2017-09-28: qty 30

## 2017-09-28 MED ORDER — DICYCLOMINE HCL 10 MG/ML IM SOLN
20.0000 mg | Freq: Once | INTRAMUSCULAR | Status: AC
  Administered 2017-09-28: 20 mg via INTRAMUSCULAR
  Filled 2017-09-28: qty 2

## 2017-09-28 MED ORDER — SODIUM CHLORIDE 0.9 % IV BOLUS
1000.0000 mL | Freq: Once | INTRAVENOUS | Status: AC
  Administered 2017-09-28: 1000 mL via INTRAVENOUS

## 2017-09-28 NOTE — ED Notes (Addendum)
Port assessed per Dr. Tera Helper order. Sterile technique and procedure used.

## 2017-09-28 NOTE — ED Provider Notes (Signed)
TIME SEEN: 2:56 AM  CHIEF COMPLAINT: Abdominal pain  HPI: Patient is a 52 year old female with history of uterine cancer on chemotherapy with last infusion on April 17 status post TAH/BSO who presents to the emergency department with complaints of abdominal pain.  Has not had a normal bowel movement in "sometime".  Her last bowel movement was 2 days ago.  She reports that her stool is like "string cheese".  She states that now she is not having any bowel movement is not passing any gas.  She called the nurse on call at the [facility] [place] who told her to come to the emergency department as they were concerned for bowel obstruction.  She has never had a bowel obstruction before.  Never had fecal impaction before.  She is having's diffuse severe abdominal cramps.  She has had nausea and vomiting.  She is on MiraLAX and Colace multiple times a day at home regularly for chronic constipation as she is on narcotic pain medication because of her cancer.  She tried magnesium citrate without any relief.  ROS: See HPI Constitutional: no fever  Eyes: no drainage  ENT: no runny nose   Cardiovascular:  no chest pain  Resp: no SOB  GI:  vomiting GU: no dysuria Integumentary: no rash  Allergy: no hives  Musculoskeletal: no leg swelling  Neurological: no slurred speech ROS otherwise negative  PAST MEDICAL HISTORY/PAST SURGICAL HISTORY:  Past Medical History:  Diagnosis Date  . Anxiety   . Anxiety and depression   . Cancer [facility]    uterine cancer  . Depression   . Genetic testing 01/10/2017   Ms. Exline underwent genetic counseling and testing for hereditary cancer syndromes on 12/26/2016. Her results were negative for mutations in all 83 genes analyzed by [facility]'s 83-gene Multi-Cancers Panel. Genes analyzed include: ALK, APC, ATM, AXIN2, BAP1, BARD1, BLM, BMPR1A, BRCA1, BRCA2, BRIP1, CASR, CDC73, CDH1, CDK4, CDKN1B, CDKN1C, CDKN2A, CEBPA, CHEK2, CTNNA1, DICER1, DIS3L2, EGFR, EPCAM, F     MEDICATIONS:  Prior to Admission medications   Medication Sig Start Date End Date Taking? Authorizing Provider  acetaminophen (TYLENOL) 325 MG tablet Take 650 mg by mouth every 6 (six) hours as needed. 11/02/16   [provider]  ALPRAZolam Duanne Moron) 0.5 MG tablet Take 1 tablet (0.5 mg total) by mouth 2 (two) times daily as needed for anxiety. 05/17/17   Heath Lark, MD  cefUROXime (CEFTIN) 250 MG tablet Take 1 tablet (250 mg total) by mouth 2 (two) times daily with a meal. 05/20/17   Tanner, Lyndon Code., PA-C  dexamethasone (DECADRON) 4 MG tablet Take 1 tablet (4 mg total) by mouth daily. Patient taking differently: Take 2 mg by mouth daily.  04/29/17   Heath Lark, MD  HYDROmorphone (DILAUDID) 8 MG tablet Take 1 tablet (8 mg total) by mouth every 4 (four) hours as needed for severe pain. 06/24/17   Heath Lark, MD  lidocaine-prilocaine (EMLA) cream Apply 1 application as needed topically. 04/15/17   Heath Lark, MD  methadone (DOLOPHINE) 10 MG tablet Take 1 tablet (10 mg total) by mouth every 8 (eight) hours. 06/24/17   Heath Lark, MD  ondansetron (ZOFRAN) 8 MG tablet Take 1 tablet (8 mg total) by mouth every 8 (eight) hours as needed for nausea or vomiting. 06/24/17   Heath Lark, MD  prochlorperazine (COMPAZINE) 10 MG tablet Take 1 tablet (10 mg total) by mouth every 6 (six) hours as needed for nausea or vomiting. 06/24/17   Heath Lark, MD  ALLERGIES:  No Known Allergies  SOCIAL HISTORY:  Social History   Tobacco Use  . Smoking status: Former Smoker    Packs/day: 1.00    Years: 25.00    Pack years: 25.00    Types: Cigarettes    Last attempt to quit: 03/28/2015    Years since quitting: 2.5  . Smokeless tobacco: Never Used  Substance Use Topics  . Alcohol use: No    Alcohol/week: 0.0 oz    Comment: Previous h/o alcoholism, sober x 8 years    FAMILY HISTORY: Family History  Problem Relation Age of Onset  . Leukemia Mother 61       in remission  . Throat cancer Maternal  Aunt        d.>50  . Alcohol abuse Maternal Uncle        d.>50    EXAM: BP (!) 174/84 (BP Location: Right Arm)   Pulse 84   Temp 98.5 F (36.9 C) (Oral)   Resp 16   Ht 5' 5"  (1.651 m)   Wt 48.1 kg (106 lb)   SpO2 99%   BMI 17.64 kg/m  CONSTITUTIONAL: Alert and oriented and responds appropriately to questions. Well-appearing; well-nourished HEAD: Normocephalic EYES: Conjunctivae clear, pupils appear equal, EOMI ENT: normal nose; moist mucous membranes NECK: Supple, no meningismus, no nuchal rigidity, no LAD  CARD: RRR; S1 and S2 appreciated; no murmurs, no clicks, no rubs, no gallops RESP: Normal chest excursion without splinting or tachypnea; breath sounds clear and equal bilaterally; no wheezes, no rhonchi, no rales, no hypoxia or respiratory distress, speaking full sentences ABD/GI: non-distended; soft, usually tender to palpation especially in the lower abdomen, hyperactive bowel sounds RECTAL:  Normal rectal tone, no gross blood or melena, no stool in the rectal vault, no hemorrhoids appreciated, nontender rectal exam, no fecal impaction BACK:  The back appears normal and is non-tender to palpation, there is no CVA tenderness EXT: Normal ROM in all joints; non-tender to palpation; no edema; normal capillary refill; no cyanosis, no calf tenderness or swelling    SKIN: Normal color for age and race; warm; no rash NEURO: Moves all extremities equally PSYCH: The patient's mood and manner are appropriate. Grooming and personal hygiene are appropriate.  MEDICAL DECISION MAKING: Patient here with abdominal pain, vomiting.  Concern for possible bowel obstruction.  Differential also includes constipation, colitis, diverticulitis.  Labs obtained in triage are unremarkable.  Will obtain CT of her abdomen pelvis for further evaluation.  Will treat symptoms with IV fluids, Zofran, Bentyl.  ED PROGRESS: Patient's pain significantly improved after Bentyl, Toradol.  Nausea gone after Zofran.   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  She has no flank pain and no difficulty urinating.  He has new lesions noted within her liver.  This is compared to a CT scan that we have in November 2018.  She reports that she is being followed at the [facility] [place] in [place].  She received a CT scan last month that showed that things were improving.  She was aware of the hydronephrosis and liver metastases.  I have offered to consult urology today but she states she will follow-up with the [facility] [place] she feels like her imaging actually showed that things have been improving.  She thinks now that she "overdid it" with laxatives and magnesium citrate.  There is no constipation or bowel obstruction seen on CT imaging.  She appears  to have liquid stool within the colon.  She will reduce her bowel regimen until her stool is more formed.  Will discharge with prescriptions of Bentyl and Zofran as they helped her significantly in the ED.  Her husband has already received a disc of her CT imaging today and we have printed a copy of her results so she can follow-up with her oncologist in [place].  Discussed at length return precautions.  Patient and husband comfortable with this plan, grateful for care.   At this time, I do not feel there is any life-threatening condition present. I have reviewed and discussed all results (EKG, imaging, lab, urine as appropriate) and exam findings with patient/family. I have reviewed nursing notes and appropriate previous records.  I feel the patient is safe to be discharged home without further emergent workup and can continue workup as an outpatient as needed. Discussed usual and customary return precautions. Patient/family verbalize understanding and are comfortable with this plan.  Outpatient follow-up has been provided if needed. All questions have been  answered.      Parmvir Boomer, Delice Bison, DO 09/28/17 [phone]

## 2017-09-28 NOTE — ED Notes (Addendum)
Pt started drinking oral contrast

## 2017-09-28 NOTE — Discharge Instructions (Addendum)
You were found to have liquid stool within your colon today.  No bowel obstruction or constipation present.  I recommend that you decrease your laxative use.  I recommend just using Colace 100 mg twice daily until your stool is more formed and then you may add MiraLAX back to your regimen.  Please drink 60 to 80 ounces of water a day.

## 2017-09-28 NOTE — ED Notes (Signed)
Ct called, pt finished contrast.

## 2017-10-01 ENCOUNTER — Telehealth: Payer: Self-pay

## 2017-10-01 ENCOUNTER — Inpatient Hospital Stay: Payer: BLUE CROSS/BLUE SHIELD

## 2017-10-01 ENCOUNTER — Encounter: Payer: Self-pay | Admitting: Hematology and Oncology

## 2017-10-01 ENCOUNTER — Inpatient Hospital Stay: Payer: BLUE CROSS/BLUE SHIELD | Attending: Hematology and Oncology | Admitting: Hematology and Oncology

## 2017-10-01 DIAGNOSIS — N1330 Unspecified hydronephrosis: Secondary | ICD-10-CM | POA: Insufficient documentation

## 2017-10-01 DIAGNOSIS — K5909 Other constipation: Secondary | ICD-10-CM | POA: Insufficient documentation

## 2017-10-01 DIAGNOSIS — C541 Malignant neoplasm of endometrium: Secondary | ICD-10-CM

## 2017-10-01 DIAGNOSIS — G893 Neoplasm related pain (acute) (chronic): Secondary | ICD-10-CM | POA: Insufficient documentation

## 2017-10-01 DIAGNOSIS — Z7189 Other specified counseling: Secondary | ICD-10-CM

## 2017-10-01 DIAGNOSIS — R1030 Lower abdominal pain, unspecified: Secondary | ICD-10-CM

## 2017-10-01 MED ORDER — HYDROMORPHONE HCL 2 MG/ML IJ SOLN
4.0000 mg | Freq: Once | INTRAMUSCULAR | Status: AC
  Administered 2017-10-01: 4 mg via INTRAVENOUS

## 2017-10-01 MED ORDER — HYDROMORPHONE HCL 2 MG/ML IJ SOLN
INTRAMUSCULAR | Status: AC
  Filled 2017-10-01: qty 2

## 2017-10-01 MED ORDER — MORPHINE SULFATE (PF) 4 MG/ML IV SOLN
INTRAVENOUS | Status: AC
  Filled 2017-10-01: qty 1

## 2017-10-01 MED ORDER — SODIUM CHLORIDE 0.9% FLUSH
10.0000 mL | Freq: Once | INTRAVENOUS | Status: AC
  Administered 2017-10-01: 10 mL
  Filled 2017-10-01: qty 10

## 2017-10-01 MED ORDER — HEPARIN SOD (PORK) LOCK FLUSH 100 UNIT/ML IV SOLN
500.0000 [IU] | Freq: Once | INTRAVENOUS | Status: DC
  Filled 2017-10-01: qty 5

## 2017-10-01 NOTE — Assessment & Plan Note (Signed)
She has significant bilateral hydronephrosis due to cancer progression I recommend avoidance of NSAID

## 2017-10-01 NOTE — Progress Notes (Signed)
[facility] OFFICE PROGRESS NOTE  Patient Care Team: Patient, No Pcp Per as PCP - General (General Practice)  ASSESSMENT & PLAN:  Endometrial cancer ([place]) Her CT scan showed clearly significant disease progression The patient continues to follow-up at [place] for chemotherapy decision After much discussion, we discussed pain management At have not make any future appointment for the patient to come back  Cancer associated pain She has severe, uncontrolled cancer pain Recent CT scan show significant disease progression As above, I will not be managing her cancer We discussed escalating doses of morphine sulfate from 30 mg twice a day to 60 mg twice a day and for her to increase her breakthrough oxycodone from 10 mg as needed to 20 mg as needed Her husband verify that she has plenty of prescription pain medicine to last until her trip back to [place] next week I do not recommend her to take any further NSAID due to hydronephrosis seen on CT imaging which will predispose her into risk of renal failure Per patient request, I will give her 1 dose of IV Dilaudid 4 mg We will reassess her pain within an hour after infusion If she continues to have severe, uncontrolled pain, she will need to be admitted to the hospital for intravenous pain medicine She agree with the plan of care  Bilateral hydronephrosis She has significant bilateral hydronephrosis due to cancer progression I recommend avoidance of NSAID  Other constipation She has abdominal pain and severe constipation We discussed laxative management  Goals of care, counseling/discussion We have extensive goals of care discussion in the past For now, the patient wants to continue treatment elsewhere I would defer to her oncologist for goals of care discussion   No orders of the defined types were placed in this encounter.   INTERVAL HISTORY: Please see below for problem oriented  charting. The patient is seen urgently due to uncontrolled pain She showed up without an appointment today She has been receiving chemotherapy with doxorubicin and tamoxifen since the last time I saw her at [place] According to her husband, she had an outside imaging study on September 05, 2017 which showed stable disease without signs of cancer progression They do not know why cisplatin was discontinued At baseline, she take MS Contin 30 mg twice daily along with oxycodone 10 mg every 4-6 hours as needed Over the past few days, she has poorly controlled pain She denies nausea or constipation as contributing factors to her uncontrolled pain She went to [facility] emergency department and was evaluated there 3 days ago She received 1 dose of Toradol along with IV Dilaudid CT imaging showed evidence of disease progression, compared with prior imaging She was subsequently discharged home Due to uncontrolled pain today, she showed up in my office requested to be seen urgently for pain management She denies cough, chest pain or shortness of breath Her pain appears to be localized in the pelvic region, radiating down to her leg and towards her back She describes her pain as severe  SUMMARY OF ONCOLOGIC HISTORY: Oncology History   MSI stable, Her 2 neg, ER patchy positivity     Endometrial cancer ([place])   04/05/2015 Imaging    CT abdomen:  1. Large bilateral hydrosalpinx/pyosalpinx. 2. Enlarged fibroid uterus. 3. Mild bilateral hydronephrosis from #1.       04/06/2015 Procedure    Status post CT-guided drainage of right and left tubo-ovarian abscess, with parallel drains  from a right trans gluteal approach. Sample was sent from both the left and the right fluid collections to the lab.      04/19/2015 Imaging    CT abdomen and pelvis: The bilateral pelvic fluid collections have resolved following placement of the percutaneous drainage catheters. Small amount of  fluid in the pelvis but no evidence for residual abscess collections or hydrosalpinx. Fibroid uterus. Innumerable uterine fibroids. Many of these fibroids are pedunculated. Development of a small left pleural effusion. Mild fat stranding in the abdominal and pelvic mesentery suggestive for mild edema      05/03/2015 Imaging    US abdomen and pelvis 1. Significantly regressed but not completely resolved bilateral hydrosalpinx/pyosalpinx. Residual is greater on the right (18 mm diameter) with mild fluid complexity. 2. Stable fibroid uterus. 3. No pelvic free fluid      07/06/2015 Pathology Results    PAP smear NEGATIVE FOR INTRAEPITHELIAL LESIONS OR MALIGNANCY.      07/18/2015 Pathology Results    Vulva, biopsy, mass - BENIGN EPIDERMAL CYST. - NO EVIDENCE OF MALIGNANCY      10/21/2016 Initial Diagnosis    She presented to the ER for severe abdominal pain      10/22/2016 Imaging    CT abdomen and pelvis 1. 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 2. Complex partially cystic mass at the left adnexa, measuring 5.9 x 5.5 x 4.3 cm. This raises concern for primary ovarian malignancy. PET/CT could be considered for further evaluation. 3. Numerous uterine fibroids again noted, some which have increased in size. Underlying endometrial malignancy cannot be excluded. 4. Dilated bilateral fluid collections have reaccumulated within the pelvis, the largest of which measures 15 cm in length. 5. Enlarged bilateral pelvic sidewall nodes measure up to 1.6 cm in short axis, compatible with metastatic disease. Mildly prominent retroperitoneal nodes seen. Mildly prominent nodes about the left inguinal vessels. 6. 1.6 cm cyst at the medial right hepatic lobe.       10/29/2016 - 11/02/2016 Hospital Admission    She was admitted to [facility] for management of newly diagnosed endometrial cancer      10/29/2016 Tumor  Marker    Patient's tumor was tested for the following markers: CA125 Results of the tumor marker test revealed 740      10/30/2016 Surgery    Procedure(s): - Open ureteral exploration - Cystourethroscopy - Left ureteral stent placement  Operative Findings:   1.) Normal cystourethrosocpy without lesions, masses or stones. Bilateral ureteral orifices identified with efflux of clear yellow urine 2.) Successful placement of left ureteral stent under visualization with appropriate curl visualized within the bladder and stent palpable within the ureter to the level of the renal pelvis 3) Open exploration determined ureter to be intact and viable.      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) - Carcinoma involves outer half of myometrium, serosal surface, and cervix - Bilateral ovaries / adnexa are positive for high grade serous carcinoma - Extensive lymphovascular space invasion is present - See synoptic report and comment  B: "Free floating abdominal mass", removal  - Benign cyst with mesothelial-type lining, consistent with peritoneal inclusion cyst, size 7.0 cm - No malignancy identified   C: Posterior cul-de-sac mass, excision  - Involved by high grade serous carcinoma  D: Lymph nodes, left pelvic, resection  - Five of six lymph nodes involved by metastatic high grade serous carcinoma, size of metastases up to 1.5 cm, with no definite extracapsular extension identified (5/6)  E: Lymph nodes, right pelvic, excision  - Four of four lymph nodes involved by metastatic high grade serous carcinoma, size of metastases up to 1.6 cm, with no definite extracapsular extension identified (4/4)  F: Lymph nodes, right para-aortic, resection  - Three of four lymph nodes involved by metastatic high grade serous  carcinoma, size of metastases up to 1.7 cm, with no definite extracapsular extension identified (3/4)  G: Omentum, omentectomy  - Involved by metastatic high grade serous carcinoma, size of nodules up to 2.9 cm   Given the presence of a large endometrial mass with serous endometrial intraepithelial carcinoma as well as deep myometrial invasion and cervical and serosal involvement, the tumor is staged as an endometrial primary. However, ovarian primary with spread to the uterus or synchronous primary tumors are also a diagnostic possibility, and definitive site of origin cannot be determined histologically. Extensive lymphovascular space invasion is present, and the tumor involves both ovaries, peritoneum, omentum, and multiple lymph nodes. No distinct fallopian tubes are identified grossly or microscopically; however, they may have been incorporated into the ovarian / adnexal tumor masses.  Immunohistochemical stains are performed on block A11 and demonstrate the tumor is positive for p53 and WT1 and is strongly diffusely positive for p16. It shows patchy focal staining for ER. WT1 staining is also positive in block A15, the right ovary. 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 Interval hysterectomy and likely oophorectomy, and possibly partial omentectomy. There is infiltration and nodularity of the upper omentum compatible with tumor along with complex density along the paracolic gutters, space of Retzius, and presacral space which may also represent tumor infiltration. 2. Soft tissue density favoring tumor along the right posterior piriformis muscle extending towards the vaginal cuff and cul-de-sac region. This likely represents tumor. 3. Stable or minimally increased retroperitoneal and pelvic adenopathy. This is thought to be malignant. 4. Reduced ascites. 5. Mild bilateral hydronephrosis and  proximal hydroureter extending down to the iliac vessel cross over is were there is stranding and some mild adenopathy. This likely represents low-grade partial ureteral obstruction and is most likely from extrinsic mass effect. 6. Aortic Atherosclerosis (ICD10-I70.0) and Emphysema (ICD10-J43.9). 7. Presacral and perirectal tumor nodularity.      11/20/2016 Tumor Marker    Patient's tumor was tested for the following markers: CA-125. Results of the tumor marker test revealed 154.2.      11/29/2016 Genetic Testing    Patient has genetic testing done for MSI instability Results revealed patient has no MSI instability      11/30/2016 - 03/18/2017 Chemotherapy    She received carboplatin & Taxol x 6 cycles       01/04/2017 Genetic Testing    No mutations were identified in 83 gene analyzed for hereditary cancer risk. VUSs were noted in ATM and GPC3.       01/10/2017 Tumor Marker    Patient's tumor was tested for the following markers: CA125 Results of the tumor marker test revealed 23.5      01/30/2017 Imaging    1. Moderate to marked response to therapy. 2. Resolution of abdominopelvic adenopathy. 3. Resolution of omental nodularity. Significantly improved peritoneal disease within  the pelvis. 4. Improvement in trace right-sided caliectasis and proximal right hydroureter. No well-defined obstructive mass. 5. Age advanced aortic atherosclerosis. 6. Mild bladder wall thickening could be due to under distention and/or cystitis.      04/12/2017 Imaging    Ct abdomen and pelvis 1. Significant progression of peritoneal metastatic disease in the pelvis since 01/30/2017 CT study. Numerous deep pelvic peritoneal metastases are increased in size and number and are intimately associated with the rectosigmoid region and vaginal cuff. 2. New retroperitoneal and right pelvic nodal metastases . 3. No evidence of bowel obstruction or acute bowel inflammation. 4. Stable chronic mild right  hydroureteronephrosis to the level of the distal right lumbar ureter. No discrete obstructing mass, although probably due to extrinsic compression by tumor at the level of the right pelvic ureter. 5. Aortic Atherosclerosis (ICD10-I70.0) and Emphysema (ICD10-J43.9).       04/17/2017 Imaging    ECHO LV EF: 65% -  70%      04/23/2017 Procedure    Successful 8 French right internal jugular vein power port placement with its tip at the SVC/RA junction      04/26/2017 - 06/18/2017 Chemotherapy    She received cisplatin and doxorubicin x 3 cycles      06/12/2017 Imaging    Outside CT scan show evidence of disease progression       REVIEW OF SYSTEMS:   Constitutional: Denies fevers, chills or abnormal weight loss Eyes: Denies blurriness of vision Ears, nose, mouth, throat, and face: Denies mucositis or sore throat Respiratory: Denies cough, dyspnea or wheezes Cardiovascular: Denies palpitation, chest discomfort or lower extremity swelling Skin: Denies abnormal skin rashes Lymphatics: Denies new lymphadenopathy or easy bruising Neurological:Denies numbness, tingling or new weaknesses Behavioral/Psych: Mood is stable, no new changes  All other systems were reviewed with the patient and are negative.  I have reviewed the past medical history, past surgical history, social history and family history with the patient and they are unchanged from previous note.  ALLERGIES:  has No Known Allergies.  MEDICATIONS:  Current Outpatient Medications  Medication Sig Dispense Refill  . lidocaine-prilocaine (EMLA) cream Apply 1 application as needed topically. (Patient not taking: Reported on 09/28/2017) 30 g 6  . meloxicam (MOBIC) 7.5 MG tablet Take 7.5 mg by mouth daily.  0  . morphine (MS CONTIN) 30 MG 12 hr tablet Take 30 mg by mouth 2 (two) times daily.  0  . ondansetron (ZOFRAN ODT) 4 MG disintegrating tablet Take 1 tablet (4 mg total) by mouth every 6 (six) hours as needed. 20 tablet 0  .  Oxycodone HCl 10 MG TABS Take 10 mg by mouth every 4 (four) hours as needed (for pain).   0  . prochlorperazine (COMPAZINE) 10 MG tablet Take 1 tablet (10 mg total) by mouth every 6 (six) hours as needed for nausea or vomiting. 90 tablet 3  . tamoxifen (NOLVADEX) 20 MG tablet Take 20 mg by mouth daily.  1   No current facility-administered medications for this visit.     PHYSICAL EXAMINATION: ECOG PERFORMANCE STATUS: 2 - Symptomatic, <50% confined to bed  Vitals:   10/01/17 1518  BP: (!) 149/109  Pulse: (!) 103  Resp: 16  Temp: 98.5 F (36.9 C)  SpO2: 99%   Filed Weights   10/01/17 1518  Weight: 105 lb 7 oz (47.8 kg)    GENERAL:alert, in obvious distress from pain.  She looks thin and mildly cachectic SKIN: skin color, texture, turgor are normal, no  rashes or significant lesions EYES: normal, Conjunctiva are pink and non-injected, sclera clear OROPHARYNX:no exudate, no erythema and lips, buccal mucosa, and tongue normal  NECK: supple, thyroid normal size, non-tender, without nodularity LYMPH:  no palpable lymphadenopathy in the cervical, axillary or inguinal LUNGS: clear to auscultation and percussion with normal breathing effort HEART: regular rate & rhythm and no murmurs and no lower extremity edema ABDOMEN:abdomen soft, diffuse tenderness throughout Musculoskeletal:no cyanosis of digits and no clubbing  NEURO: alert & oriented x 3 with fluent speech, no focal motor/sensory deficits  LABORATORY DATA:  I have reviewed the data as listed    Component Value Date/Time   NA 132 (L) 09/27/2017 2219   NA 133 (L) 05/20/2017 1443   K 3.9 09/27/2017 2219   K 4.2 05/20/2017 1443   CL 95 (L) 09/27/2017 2219   CO2 27 09/27/2017 2219   CO2 22 05/20/2017 1443   GLUCOSE 140 (H) 09/27/2017 2219   GLUCOSE 109 05/20/2017 1443   BUN 6 09/27/2017 2219   BUN 23.8 05/20/2017 1443   CREATININE 0.79 09/27/2017 2219   CREATININE 0.7 05/20/2017 1443   CALCIUM 8.9 09/27/2017 2219    CALCIUM 9.5 05/20/2017 1443   PROT 6.7 09/27/2017 2219   PROT 7.7 05/20/2017 1443   ALBUMIN 3.4 (L) 09/27/2017 2219   ALBUMIN 4.0 05/20/2017 1443   AST 29 09/27/2017 2219   AST 32 05/20/2017 1443   ALT 26 09/27/2017 2219   ALT 71 (H) 05/20/2017 1443   ALKPHOS 71 09/27/2017 2219   ALKPHOS 95 05/20/2017 1443   BILITOT 0.5 09/27/2017 2219   BILITOT 0.40 05/20/2017 1443   GFRNONAA >60 09/27/2017 2219   GFRAA >60 09/27/2017 2219    No results found for: SPEP, UPEP  Lab Results  Component Value Date   WBC 1.9 (L) 09/27/2017   NEUTROABS 4.5 06/18/2017   HGB 8.9 (L) 09/27/2017   HCT 26.0 (L) 09/27/2017   MCV 96.3 09/27/2017   PLT 122 (L) 09/27/2017      Chemistry      Component Value Date/Time   NA 132 (L) 09/27/2017 2219   NA 133 (L) 05/20/2017 1443   K 3.9 09/27/2017 2219   K 4.2 05/20/2017 1443   CL 95 (L) 09/27/2017 2219   CO2 27 09/27/2017 2219   CO2 22 05/20/2017 1443   BUN 6 09/27/2017 2219   BUN 23.8 05/20/2017 1443   CREATININE 0.79 09/27/2017 2219   CREATININE 0.7 05/20/2017 1443      Component Value Date/Time   CALCIUM 8.9 09/27/2017 2219   CALCIUM 9.5 05/20/2017 1443   ALKPHOS 71 09/27/2017 2219   ALKPHOS 95 05/20/2017 1443   AST 29 09/27/2017 2219   AST 32 05/20/2017 1443   ALT 26 09/27/2017 2219   ALT 71 (H) 05/20/2017 1443   BILITOT 0.5 09/27/2017 2219   BILITOT 0.40 05/20/2017 1443       RADIOGRAPHIC STUDIES: I have personally reviewed the radiological images as listed and agreed with the findings in the report. Ct Abdomen Pelvis W Contrast  Result Date: 09/28/2017 CLINICAL DATA:  52 year old female with history of lower abdominal pain and vomiting since yesterday. History of uterine cancer undergoing treatments. EXAM: CT ABDOMEN AND PELVIS WITH CONTRAST TECHNIQUE: Multidetector CT imaging of the abdomen and pelvis was performed using the standard protocol following bolus administration of intravenous contrast. CONTRAST:  167m OMNIPAQUE IOHEXOL  300 MG/ML  SOLN COMPARISON:  CT the abdomen and pelvis 04/12/2017. FINDINGS: Lower chest: Unremarkable. Hepatobiliary: Well-defined  2.0 cm low-attenuation lesion in segment 4A of the liver (axial image 15 of series 3), similar to the prior study, compatible with a simple cyst. There are at least 2 new lesions in the right lobe of the liver concerning for metastatic lesions on images 11 and 14 of series 3. The largest of these is on image 11 measuring 17 x 11 mm and appears to be hypovascular in the periphery of segment 7. No intra or extrahepatic biliary ductal dilatation. Gallbladder is normal in appearance. Pancreas: Small calcification in the pancreatic head, likely sequela of prior pancreatitis. No definite pancreatic mass. No pancreatic ductal dilatation. No pancreatic or peripancreatic fluid or inflammatory changes. Spleen: Unremarkable. Adrenals/Urinary Tract: Worsening now severe right-sided hydroureteronephrosis, and new mild to moderate left-sided hydroureteronephrosis, both of which appears related to obstruction of the distal third of the ureters by increasing pelvic soft tissue (discussed below). Slight decreased nephrogram on the right as compared with the left, and lack of excretion of contrast material into the collecting system of the right kidney on delayed images, indicative of compromised renal function in the setting of worsening obstruction. No suspicious renal lesions in either kidney. Bilateral adrenal glands are normal in appearance. Urinary bladder is unremarkable in appearance. Stomach/Bowel: Normal appearance of the stomach. No pathologic dilatation of small bowel or colon. Large volume of liquid stool throughout the colon. Relative narrowing of the sigmoid colon as it transitions through the pelvis, presumably related to mass effect and adhesions secondary to enlarging soft tissue masses throughout the low anatomic pelvis (discussed below). Normal appendix. Vascular/Lymphatic: Aortic  atherosclerosis, without definite aneurysm or dissection in the abdominal or pelvic vasculature. Numerous prominent borderline enlarged and mildly enlarged retroperitoneal lymph nodes measuring up to 12 mm in short axis in the low left para-aortic nodal station (axial image 41 of series 3), similar to prior study from 04/12/2017. Abnormal enhancing soft tissue throughout the low anatomic pelvis, including some areas in the internal iliac distribution, some of which may represent lymphadenopathy (although this is difficult to distinguish from presumed peritoneal implants). Reproductive: Status post total abdominal hysterectomy and bilateral salpingo-oophorectomy. When compared to the prior examination, the previously noted peritoneal implant in the left hemipelvis along the left pelvic side wall has significantly increased in size, currently measuring 3.7 x 2.8 cm (axial image 67 of series 3), as compared with 2.5 x 2.6 cm on prior study 04/12/2017. Previously noted right-sided implant in the low anatomic pelvis (axial image 66 of series 3) has also slightly increased in size, currently measuring 3.4 x 2.8 cm (previously 3.3 x 2.6 cm). Increasing soft tissue extending cephalad from the vaginal cuff, best appreciated on axial image 69 of series 3, currently measuring 4.4 x 2.9 cm (previously far less notable and difficult to measure). Substantial increase in enhancing presacral soft tissue best appreciated on axial image 61 of series 3 currently measuring 5.3 x 3.0 cm (also previously much smaller and difficult to measure). Several other smaller peritoneal implants are noted throughout the low anatomic pelvis, some of which involve the small bowel mesentery. Other: No significant volume of ascites.  No pneumoperitoneum. Musculoskeletal: There are no aggressive appearing lytic or blastic lesions noted in the visualized portions of the skeleton. IMPRESSION: 1. Progressive metastatic disease with worsening peritoneal  implants throughout the low anatomic pelvis and along the pelvic sidewall, in addition to stable retroperitoneal lymphadenopathy, as detailed above. This is now associated with worsening bilateral hydroureteronephrosis (right much greater than left), with decreased enhancement and lack  of excretion of contrast material by the right kidney on today's examination. Urologic consultation for possible ureteral stent is suggested if clinically appropriate for palliation. 2. New hypovascular lesions in the right lobe of the liver, concerning for new metastatic lesions. Attention on followup studies is recommended. 3. Aortic atherosclerosis. 4. Additional incidental findings, as above. Electronically Signed   By: Vinnie Langton M.D.   On: 09/28/2017 08:07    All questions were answered. The patient knows to call the clinic with any problems, questions or concerns. No barriers to learning was detected.  I spent 30 minutes counseling the patient face to face. The total time spent in the appointment was 40 minutes and more than 50% was on counseling and review of test results  Heath Lark, MD 10/01/2017 3:48 PM

## 2017-10-01 NOTE — Assessment & Plan Note (Signed)
We have extensive goals of care discussion in the past For now, the patient wants to continue treatment elsewhere I would defer to her oncologist for goals of care discussion

## 2017-10-01 NOTE — Assessment & Plan Note (Signed)
She has severe, uncontrolled cancer pain Recent CT scan show significant disease progression As above, I will not be managing her cancer We discussed escalating doses of morphine sulfate from 30 mg twice a day to 60 mg twice a day and for her to increase her breakthrough oxycodone from 10 mg as needed to 20 mg as needed Her husband verify that she has plenty of prescription pain medicine to last until her trip back to [place] next week I do not recommend her to take any further NSAID due to hydronephrosis seen on CT imaging which will predispose her into risk of renal failure Per patient request, I will give her 1 dose of IV Dilaudid 4 mg We will reassess her pain within an hour after infusion If she continues to have severe, uncontrolled pain, she will need to be admitted to the hospital for intravenous pain medicine She agree with the plan of care

## 2017-10-01 NOTE — Assessment & Plan Note (Signed)
She has abdominal pain and severe constipation We discussed laxative management

## 2017-10-01 NOTE — Assessment & Plan Note (Signed)
Her CT scan showed clearly significant disease progression The patient continues to follow-up at [facility] for chemotherapy decision After much discussion, we discussed pain management At have not make any future appointment for the patient to come back

## 2017-10-01 NOTE — Telephone Encounter (Signed)
Pt of Dr. Alvy Bimler. Filled out walk-in form today with c/o extreme pain. Pt last seen by Dr. Alvy Bimler 06/24/17. Pt receiving treatment at [facility] in [place], [place]. Discussed with Dr. Alvy Bimler, who is willing to see the patient today. Spoke with pt and husband in the lobby. CCA has been managing pt's pain with morphine bid and oxycodone. Pt had severe breakthrough pain a few weeks ago for which she was given IM dilaudid and toradol per pt. Pt and husband willing to see Dr. Alvy Bimler today. Escorted to scheduling with Ubaldo Glassing, and instructed to go back to registration and wait in [place] lobby to be checked in by NT with VS, and then to see Dr. Alvy Bimler. Pt and husband verbalized understanding. Tammi, RN with Dr. Alvy Bimler notified of plan for visit.

## 2017-10-03 ENCOUNTER — Emergency Department (HOSPITAL_COMMUNITY): Payer: BLUE CROSS/BLUE SHIELD

## 2017-10-03 ENCOUNTER — Encounter (HOSPITAL_COMMUNITY): Payer: Self-pay | Admitting: Emergency Medicine

## 2017-10-03 ENCOUNTER — Inpatient Hospital Stay (HOSPITAL_COMMUNITY)
Admission: EM | Admit: 2017-10-03 | Discharge: 2017-10-08 | DRG: 755 | Disposition: A | Discharge status: Home / Self Care | Payer: BLUE CROSS/BLUE SHIELD | Attending: Family Medicine | Admitting: Family Medicine

## 2017-10-03 DIAGNOSIS — F329 Major depressive disorder, single episode, unspecified: Secondary | ICD-10-CM | POA: Diagnosis present

## 2017-10-03 DIAGNOSIS — C541 Malignant neoplasm of endometrium: Secondary | ICD-10-CM | POA: Diagnosis not present

## 2017-10-03 DIAGNOSIS — Z515 Encounter for palliative care: Secondary | ICD-10-CM | POA: Diagnosis not present

## 2017-10-03 DIAGNOSIS — Z79899 Other long term (current) drug therapy: Secondary | ICD-10-CM | POA: Diagnosis not present

## 2017-10-03 DIAGNOSIS — K5909 Other constipation: Secondary | ICD-10-CM | POA: Diagnosis present

## 2017-10-03 DIAGNOSIS — F419 Anxiety disorder, unspecified: Secondary | ICD-10-CM | POA: Diagnosis present

## 2017-10-03 DIAGNOSIS — T40605A Adverse effect of unspecified narcotics, initial encounter: Secondary | ICD-10-CM | POA: Diagnosis present

## 2017-10-03 DIAGNOSIS — L299 Pruritus, unspecified: Secondary | ICD-10-CM | POA: Diagnosis present

## 2017-10-03 DIAGNOSIS — K5903 Drug induced constipation: Secondary | ICD-10-CM | POA: Diagnosis present

## 2017-10-03 DIAGNOSIS — G893 Neoplasm related pain (acute) (chronic): Secondary | ICD-10-CM | POA: Diagnosis not present

## 2017-10-03 DIAGNOSIS — T451X5A Adverse effect of antineoplastic and immunosuppressive drugs, initial encounter: Secondary | ICD-10-CM | POA: Diagnosis present

## 2017-10-03 DIAGNOSIS — C772 Secondary and unspecified malignant neoplasm of intra-abdominal lymph nodes: Secondary | ICD-10-CM | POA: Diagnosis present

## 2017-10-03 DIAGNOSIS — D701 Agranulocytosis secondary to cancer chemotherapy: Secondary | ICD-10-CM | POA: Diagnosis present

## 2017-10-03 DIAGNOSIS — C787 Secondary malignant neoplasm of liver and intrahepatic bile duct: Secondary | ICD-10-CM | POA: Diagnosis present

## 2017-10-03 DIAGNOSIS — Z87891 Personal history of nicotine dependence: Secondary | ICD-10-CM

## 2017-10-03 DIAGNOSIS — F1021 Alcohol dependence, in remission: Secondary | ICD-10-CM | POA: Diagnosis present

## 2017-10-03 DIAGNOSIS — N1330 Unspecified hydronephrosis: Secondary | ICD-10-CM | POA: Diagnosis not present

## 2017-10-03 DIAGNOSIS — R52 Pain, unspecified: Secondary | ICD-10-CM | POA: Diagnosis not present

## 2017-10-03 DIAGNOSIS — R110 Nausea: Secondary | ICD-10-CM | POA: Diagnosis not present

## 2017-10-03 LAB — CBC WITH DIFFERENTIAL/PLATELET
BASOS ABS: 0 10*3/uL (ref 0.0–0.1)
Basophils Relative: 1 %
Eosinophils Absolute: 0 10*3/uL (ref 0.0–0.7)
Eosinophils Relative: 1 %
HEMATOCRIT: 23.5 % — AB (ref 36.0–46.0)
Hemoglobin: 8.1 g/dL — ABNORMAL LOW (ref 12.0–15.0)
LYMPHS ABS: 1.2 10*3/uL (ref 0.7–4.0)
Lymphocytes Relative: 62 %
MCH: 33.5 pg (ref 26.0–34.0)
MCHC: 34.5 g/dL (ref 30.0–36.0)
MCV: 97.1 fL (ref 78.0–100.0)
MONO ABS: 0.3 10*3/uL (ref 0.1–1.0)
MONOS PCT: 19 %
NEUTROS ABS: 0.3 10*3/uL — AB (ref 1.7–7.7)
Neutrophils Relative %: 17 %
Platelets: 158 10*3/uL (ref 150–400)
RBC: 2.42 MIL/uL — AB (ref 3.87–5.11)
RDW: 16.6 % — AB (ref 11.5–15.5)
WBC: 1.8 10*3/uL — AB (ref 4.0–10.5)

## 2017-10-03 LAB — COMPREHENSIVE METABOLIC PANEL
ALT: 24 U/L (ref 14–54)
AST: 35 U/L (ref 15–41)
Albumin: 3.5 g/dL (ref 3.5–5.0)
Alkaline Phosphatase: 61 U/L (ref 38–126)
Anion gap: 11 (ref 5–15)
BILIRUBIN TOTAL: 0.2 mg/dL — AB (ref 0.3–1.2)
BUN: 14 mg/dL (ref 6–20)
CALCIUM: 9 mg/dL (ref 8.9–10.3)
CO2: 28 mmol/L (ref 22–32)
CREATININE: 0.75 mg/dL (ref 0.44–1.00)
Chloride: 93 mmol/L — ABNORMAL LOW (ref 101–111)
GFR calc non Af Amer: >60 mL/min (ref >60)
Glucose, Bld: 130 mg/dL — ABNORMAL HIGH (ref 65–99)
Potassium: 3.7 mmol/L (ref 3.5–5.1)
Sodium: 132 mmol/L — ABNORMAL LOW (ref 135–145)
TOTAL PROTEIN: 6.6 g/dL (ref 6.5–8.1)

## 2017-10-03 LAB — URINALYSIS, ROUTINE W REFLEX MICROSCOPIC
BILIRUBIN URINE: NEGATIVE
Glucose, UA: NEGATIVE mg/dL
HGB URINE DIPSTICK: NEGATIVE
Ketones, ur: NEGATIVE mg/dL
Leukocytes, UA: NEGATIVE
Nitrite: NEGATIVE
Protein, ur: NEGATIVE mg/dL
SPECIFIC GRAVITY, URINE: 1.016 (ref 1.005–1.030)
pH: 6 (ref 5.0–8.0)

## 2017-10-03 LAB — LIPASE, BLOOD: Lipase: 21 U/L (ref 11–51)

## 2017-10-03 LAB — I-STAT CG4 LACTIC ACID, ED: Lactic Acid, Venous: 1.86 mmol/L (ref 0.5–1.9)

## 2017-10-03 MED ORDER — MAGNESIUM CITRATE PO SOLN
1.0000 | Freq: Once | ORAL | Status: AC
  Administered 2017-10-04: 1 via ORAL
  Filled 2017-10-03: qty 296

## 2017-10-03 MED ORDER — HYDROMORPHONE HCL 2 MG/ML IJ SOLN
2.0000 mg | INTRAMUSCULAR | Status: AC | PRN
  Administered 2017-10-03 (×2): 2 mg via INTRAVENOUS
  Filled 2017-10-03 (×3): qty 1

## 2017-10-03 MED ORDER — HYDROMORPHONE HCL 2 MG/ML IJ SOLN
2.0000 mg | Freq: Once | INTRAMUSCULAR | Status: AC
  Administered 2017-10-03: 2 mg via INTRAVENOUS
  Filled 2017-10-03: qty 1

## 2017-10-03 MED ORDER — ONDANSETRON HCL 4 MG/2ML IJ SOLN
4.0000 mg | Freq: Once | INTRAMUSCULAR | Status: AC
  Administered 2017-10-03: 4 mg via INTRAVENOUS
  Filled 2017-10-03: qty 2

## 2017-10-03 MED ORDER — DIPHENHYDRAMINE HCL 50 MG/ML IJ SOLN
25.0000 mg | Freq: Once | INTRAMUSCULAR | Status: AC
  Administered 2017-10-03: 25 mg via INTRAVENOUS
  Filled 2017-10-03: qty 1

## 2017-10-03 MED ORDER — SODIUM CHLORIDE 0.9 % IV SOLN
Freq: Once | INTRAVENOUS | Status: AC
  Administered 2017-10-03: 19:00:00 via INTRAVENOUS

## 2017-10-03 MED ORDER — HYDROMORPHONE HCL 1 MG/ML IJ SOLN: 1.0000 mg | Freq: Once | INTRAMUSCULAR | Status: DC

## 2017-10-03 NOTE — ED Provider Notes (Signed)
[facility] DEPT Provider Note   CSN: [id number] Arrival date & time: 10/03/17  1748     History   Chief Complaint Chief Complaint  Patient presents with  . Pelvic Pain  . Cancer    HPI Kareem Aul is a 52 y.o. female.  HPI   52 year old female with past medical history as below including metastatic uterine cancer here with worsening pain.  Patient currently gets treated at cancer [facility] of [place].  She also sees Dr. Alvy Bimler locally.  Unfortunately, patient has had progressively worsening pain over the last 1 to 2 weeks.  She was seen recently and had a CT which showed worsening disease.  She is got an appointment with the cancer center in [place] next Tuesday.  She saw Dr. Simeon Craft such recently and had her pain meds doubled but this has not improved her pain.  Her pain is 10 out of 10, severe, aching, throbbing, lower abdominal pain.  Is in the same location, only worse than her usual pain.  She said associated nausea.  No vomiting.  She is began having more normal bowel movements after recent bowel regimen.  Denies any fevers.  No urinary symptoms.  Past Medical History:  Diagnosis Date  . Anxiety   . Anxiety and depression   . Cancer [facility])    uterine cancer  . Depression   . Genetic testing 01/10/2017   Ms. Pullara underwent genetic counseling and testing for hereditary cancer syndromes on 12/26/2016. Her results were negative for mutations in all 83 genes analyzed by [facility]'s 83-gene Multi-Cancers Panel. Genes analyzed include: ALK, APC, ATM, AXIN2, BAP1, BARD1, BLM, BMPR1A, BRCA1, BRCA2, BRIP1, CASR, CDC73, CDH1, CDK4, CDKN1B, CDKN1C, CDKN2A, CEBPA, CHEK2, CTNNA1, DICER1, DIS3L2, EGFR, EPCAM, F    Patient Active Problem List   Diagnosis Date Noted  . Bilateral hydronephrosis 10/01/2017  . Oral thrush 06/24/2017  . Abdominal pain 03/11/2017  . Goals of care, counseling/discussion 02/22/2017  . Arteriosclerosis 01/31/2017  . Genetic testing  01/10/2017  . Leukopenia due to antineoplastic chemotherapy ([place]) 12/20/2016  . Cancer associated pain 11/21/2016  . Weight loss 11/08/2016  . Other constipation 11/08/2016  . Endometrial cancer ([place]) 11/07/2016    Past Surgical History:  Procedure Laterality Date  . CERVICAL BIOPSY  W/ LOOP ELECTRODE EXCISION    . DENTAL SURGERY    . IR FLUORO GUIDE PORT INSERTION RIGHT  04/23/2017  . IR US GUIDE VASC ACCESS RIGHT  04/23/2017     OB History    Gravida  0   Para  0   Term  0   Preterm  0   AB  0   Living  0     SAB  0   TAB  0   Ectopic  0   Multiple  0   Live Births               Home Medications    Prior to Admission medications   Medication Sig Start Date End Date Taking? Authorizing Provider  lidocaine-prilocaine (EMLA) cream Apply 1 application as needed topically. Patient not taking: Reported on 09/28/2017 04/15/17   Heath Lark, MD  meloxicam (MOBIC) 7.5 MG tablet Take 7.5 mg by mouth daily. 09/11/17   [provider]  morphine (MS CONTIN) 30 MG 12 hr tablet Take 30 mg by mouth 2 (two) times daily. 09/05/17   [provider]  ondansetron (ZOFRAN ODT) 4 MG disintegrating tablet Take 1 tablet (4 mg total) by mouth every  6 (six) hours as needed. 09/28/17   Ward, Delice Bison, DO  Oxycodone HCl 10 MG TABS Take 10 mg by mouth every 4 (four) hours as needed (for pain).  08/08/17   [provider]  prochlorperazine (COMPAZINE) 10 MG tablet Take 1 tablet (10 mg total) by mouth every 6 (six) hours as needed for nausea or vomiting. 06/24/17   Heath Lark, MD  tamoxifen (NOLVADEX) 20 MG tablet Take 20 mg by mouth daily. 09/09/17   [provider]    Family History Family History  Problem Relation Age of Onset  . Leukemia Mother 81       in remission  . Throat cancer Maternal Aunt        d.>50  . Alcohol abuse Maternal Uncle        d.>50    Social History Social History   Tobacco Use  . Smoking status: Former Smoker     Packs/day: 1.00    Years: 25.00    Pack years: 25.00    Types: Cigarettes    Last attempt to quit: 03/28/2015    Years since quitting: 2.5  . Smokeless tobacco: Never Used  Substance Use Topics  . Alcohol use: No    Alcohol/week: 0.0 oz    Comment: Previous h/o alcoholism, sober x 8 years  . Drug use: Yes    Types: Marijuana    Comment: remote h/o IVDU, has been worked up for parenterally transmitted infections multiple times since     Allergies   Patient has no known allergies.   Review of Systems Review of Systems  Constitutional: Positive for fatigue.  Gastrointestinal: Positive for abdominal pain and nausea.  Neurological: Positive for weakness.  All other systems reviewed and are negative.    Physical Exam Updated Vital Signs BP (!) 160/80 (BP Location: Right Arm)   Pulse 78   Temp 97.8 F (36.6 C) (Oral)   Resp 18   SpO2 99%   Physical Exam  Constitutional: She is oriented to person, place, and time. She appears well-developed and well-nourished. No distress.  HENT:  Head: Normocephalic and atraumatic.  Eyes: Conjunctivae are normal.  Neck: Neck supple.  Cardiovascular: Normal rate, regular rhythm and normal heart sounds. Exam reveals no friction rub.  No murmur heard. Pulmonary/Chest: Effort normal and breath sounds normal. No respiratory distress. She has no wheezes. She has no rales.  Abdominal: She exhibits no distension. There is tenderness in the suprapubic area. There is no rigidity, no rebound and no guarding.  Musculoskeletal: She exhibits no edema.  Neurological: She is alert and oriented to person, place, and time. She exhibits normal muscle tone.  Skin: Skin is warm. Capillary refill takes less than 2 seconds.  Psychiatric: She has a normal mood and affect.  Nursing note and vitals reviewed.    ED Treatments / Results  Labs (all labs ordered are listed, but only abnormal results are displayed) Labs Reviewed  CBC WITH DIFFERENTIAL/PLATELET  - Abnormal; Notable for the following components:      Result Value   WBC 1.8 (*)    RBC 2.42 (*)    Hemoglobin 8.1 (*)    HCT 23.5 (*)    RDW 16.6 (*)    Neutro Abs 0.3 (*)    All other components within normal limits  COMPREHENSIVE METABOLIC PANEL - Abnormal; Notable for the following components:   Sodium 132 (*)    Chloride 93 (*)    Glucose, Bld 130 (*)    Total  Bilirubin 0.2 (*)    All other components within normal limits  LIPASE, BLOOD  URINALYSIS, ROUTINE W REFLEX MICROSCOPIC  I-STAT CG4 LACTIC ACID, ED  I-STAT CG4 LACTIC ACID, ED    EKG None  Radiology Dg Abd Portable 2 Views  Result Date: 10/03/2017 CLINICAL DATA:  Pelvic pain.  Cancer patient on chemotherapy. EXAM: PORTABLE ABDOMEN - 2 VIEW COMPARISON:  CT abdomen and pelvis dated 09/28/2017. FINDINGS: The bowel gas pattern is normal. There is no evidence of free air. No radio-opaque calculi or other significant radiographic abnormality is seen. Lung bases are clear. IMPRESSION: Negative. Please note that CT of 09/28/2017 showed progressive metastatic disease with worsening peritoneal implants, in addition to retroperitoneal lymphadenopathy, with worsening bilateral hydroureteronephrosis. Electronically Signed   By: Franki Cabot M.D.   On: 10/03/2017 20:21    Procedures Procedures (including critical care time)  Medications Ordered in ED Medications  ondansetron (ZOFRAN) injection 4 mg (4 mg Intravenous Given 10/03/17 1926)  HYDROmorphone (DILAUDID) injection 2 mg (2 mg Intravenous Given 10/03/17 1926)  0.9 %  sodium chloride infusion ( Intravenous New Bag/Given 10/03/17 1927)  HYDROmorphone (DILAUDID) injection 2 mg (2 mg Intravenous Given 10/03/17 2043)     Initial Impression / Assessment and Plan / ED Course  I have reviewed the triage vital signs and the nursing notes.  Pertinent labs & imaging results that were available during my care of the patient were reviewed by me and considered in my medical decision making  (see chart for details).     52 year old female with history of metastatic uterine cancer here with generalized body pain.  Suspect this is acute on chronic pain related to her cancer.  She is not tolerating her oral pain regimen.  Per her oncologist as well as persistent pain despite multiple IV analgesia doses here, will admit for pain control.  No obstruction on recent plain films.  Patient has no lab abnormalities compared to her baseline.  Lactic acid normal.  Given recent CT scan with reassuring labs and exam, do not feel repeat imaging is indicated.  Patient in agreement.  Final Clinical Impressions(s) / ED Diagnoses   Final diagnoses:  Intractable pain    ED Discharge Orders    None       Duffy Bruce, MD 10/03/17 2121

## 2017-10-03 NOTE — ED Triage Notes (Signed)
Patient here from home with complaints of pelvic pain. Cancer patient, active chemo. Pain all over pelvic area. Takes morphine twice daily with oxycodone, no relief recently.

## 2017-10-03 NOTE — H&P (Signed)
History and Physical    Kerry Morris YTK:[id number] DOB: 07-03-1965 DOA: 10/03/2017  PCP: Patient, No Pcp Per   Patient coming from: home    Chief Complaint: pain  HPI: Kerry Morris is a 52 y.o. 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  Patient reports that she recently returned from [place] where she was seeing her doctors from cancer treatment centers [facility] [place].  In the interim she developed significant amounts of diarrhea she had been taking a great deal of magnesium citrate.  She cut back on her stool softeners and subsequent he developed significant amounts of constipation.  Her chronic abdominal pain from her cancer flared up.  She reports this pain is primarily suprapubic and radiates down to her groin and her legs.  The pain is not sharp or dull but is constantly present and does not wax or wane.  She reports that generally stays 2-3 out of 10 however recently it is been 5-8 out of 10 which is unbearable for her.  She was recently seen by her oncologist who increased her opiates to oxycodone 20 mg every 4 hours as needed and MS Contin 60 mg twice daily.  She just started her first dose that she had an episode of nonbilious nonbloody emesis with these medications.  She denies any fevers, cough, congestion, rhinorrhea, bloody emesis, chest pain, syncope/presyncope.  ED Course: In the ED patient's vitals were reassuring.  Patient's labs were notable for her baseline anemia and leukopenia.  Abdominal x-ray was unremarkable.  Patient did not have the pain control with multiple 2 mg IV hydromorphone boluses.  Review of Systems: As per HPI otherwise 10 point review of systems negative.   Past Medical History:  Diagnosis Date  .  Anxiety   . Anxiety and depression   . Cancer [facility])    uterine cancer  . Depression   . Genetic testing 01/10/2017   Kerry Morris underwent genetic counseling and testing for hereditary cancer syndromes on 12/26/2016. Her results were negative for mutations in all 83 genes analyzed by [facility]'s 83-gene Multi-Cancers Panel. Genes analyzed include: ALK, APC, ATM, AXIN2, BAP1, BARD1, BLM, BMPR1A, BRCA1, BRCA2, BRIP1, CASR, CDC73, CDH1, CDK4, CDKN1B, CDKN1C, CDKN2A, CEBPA, CHEK2, CTNNA1, DICER1, DIS3L2, EGFR, EPCAM, F    Past Surgical History:  Procedure Laterality Date  . CERVICAL BIOPSY  W/ LOOP ELECTRODE EXCISION    . DENTAL SURGERY    . IR FLUORO GUIDE PORT INSERTION RIGHT  04/23/2017  . IR US GUIDE VASC ACCESS RIGHT  04/23/2017     reports that she quit smoking about 2 years ago. Her smoking use included cigarettes. She has a 25.00 pack-year smoking history. She has never used smokeless tobacco. She reports that she has current or past drug history. Drug: Marijuana. She reports that she does not drink alcohol.  No Known Allergies  Family History  Problem Relation Age of Onset  . Leukemia Mother 11       in remission  . Throat cancer Maternal Aunt        d.>50  . Alcohol abuse Maternal Uncle        d.>50   Unacceptable: Noncontributory, unremarkable, or negative. Acceptable: Family history reviewed and not pertinent (If you reviewed it)  Prior to Admission medications   Medication Sig  Start Date End Date Taking? Authorizing Provider  docusate sodium (COLACE) 100 MG capsule Take 100 mg by mouth daily as needed for mild constipation.   Yes [provider]  meloxicam (MOBIC) 7.5 MG tablet Take 7.5 mg by mouth daily. 09/11/17  Yes [provider]  morphine (MS CONTIN) 30 MG 12 hr tablet Take 30 mg by mouth 2 (two) times daily. 09/05/17  Yes [provider]  ondansetron (ZOFRAN ODT) 4 MG disintegrating tablet Take 1 tablet (4 mg total) by mouth every 6 (six)  hours as needed. 09/28/17  Yes Ward, Delice Bison, DO  Oxycodone HCl 10 MG TABS Take 10 mg by mouth every 4 (four) hours as needed (for pain).  08/08/17  Yes [provider]  prochlorperazine (COMPAZINE) 10 MG tablet Take 1 tablet (10 mg total) by mouth every 6 (six) hours as needed for nausea or vomiting. 06/24/17  Yes Alvy Bimler, Ni, MD  tamoxifen (NOLVADEX) 20 MG tablet Take 20 mg by mouth daily. 09/09/17  Yes [provider]  lidocaine-prilocaine (EMLA) cream Apply 1 application as needed topically. Patient not taking: Reported on 09/28/2017 04/15/17   Heath Lark, MD    Physical Exam: Vitals:   10/03/17 1831 10/03/17 2036 10/03/17 2100 10/03/17 2215  BP: (!) 174/96 (!) 160/80 (!) 168/89 (!) 170/81  Pulse: 95 78 82 77  Resp: _0 Temp: 97.8 F (36.6 C)     TempSrc: Oral     SpO2: 99% 99% 98% 98%    Constitutional: NAD, calm, comfortable Vitals:   10/03/17 1831 10/03/17 2036 10/03/17 2100 10/03/17 2215  BP: (!) 174/96 (!) 160/80 (!) 168/89 (!) 170/81  Pulse: 95 78 82 77  Resp: _1 Temp: 97.8 F (36.6 C)     TempSrc: Oral     SpO2: 99% 99% 98% 98%   Eyes: Anicteric sclera ENMT: Dry mucous membranes Neck: normal, supple Respiratory: clear to auscultation bilaterally, no wheezing, no crackles. Normal respiratory effort. No accessory muscle use.  Cardiovascular: Regular rate and rhythm, no murmurs.  Abdomen: Soft, diminished bowel sounds, suprapubic tenderness, no rebound or guarding Musculoskeletal: Lower extremity edema Skin: Port site is clean dry intact Neurologic: Mostly intact, moving all extremities Psychiatric: Normal judgment and insight. Alert and oriented x 3. Normal mood.    Labs on Admission: I have personally reviewed following labs and imaging studies  CBC: Recent Labs  Lab 09/27/17 2219 10/03/17 1927  WBC 1.9* 1.8*  NEUTROABS  --  0.3*  HGB 8.9* 8.1*  HCT 26.0* 23.5*  MCV 96.3 97.1  PLT 122* 016   Basic Metabolic  Panel: Recent Labs  Lab 09/27/17 2219 10/03/17 1927  NA 132* 132*  K 3.9 3.7  CL 95* 93*  CO2 27 28  GLUCOSE 140* 130*  BUN 6 14  CREATININE 0.79 0.75  CALCIUM 8.9 9.0   GFR: Estimated Creatinine Clearance: 62.1 mL/min (by C-G formula based on SCr of 0.75 mg/dL). Liver Function Tests: Recent Labs  Lab 09/27/17 2219 10/03/17 1927  AST 29 35  ALT 26 24  ALKPHOS 71 61  BILITOT 0.5 0.2*  PROT 6.7 6.6  ALBUMIN 3.4* 3.5   Recent Labs  Lab 09/27/17 2219 10/03/17 1927  LIPASE 20 21   No results for input(s): AMMONIA in the last 168 hours. Coagulation Profile: No results for input(s): INR, PROTIME in the last 168 hours. Cardiac Enzymes: No results for input(s): CKTOTAL, CKMB, CKMBINDEX, TROPONINI in the last 168 hours. BNP (  last 3 results) No results for input(s): PROBNP in the last 8760 hours. HbA1C: No results for input(s): HGBA1C in the last 72 hours. CBG: No results for input(s): GLUCAP in the last 168 hours. Lipid Profile: No results for input(s): CHOL, HDL, LDLCALC, TRIG, CHOLHDL, LDLDIRECT in the last 72 hours. Thyroid Function Tests: No results for input(s): TSH, T4TOTAL, FREET4, T3FREE, THYROIDAB in the last 72 hours. Anemia Panel: No results for input(s): VITAMINB12, FOLATE, FERRITIN, TIBC, IRON, RETICCTPCT in the last 72 hours. Urine analysis:    Component Value Date/Time   COLORURINE YELLOW 10/03/2017 2201   APPEARANCEUR CLOUDY (A) 10/03/2017 2201   LABSPEC 1.016 10/03/2017 2201   LABSPEC 1.005 03/12/2017 1011   PHURINE 6.0 10/03/2017 2201   GLUCOSEU NEGATIVE 10/03/2017 2201   GLUCOSEU Negative 03/12/2017 1011   HGBUR NEGATIVE 10/03/2017 2201   BILIRUBINUR NEGATIVE 10/03/2017 2201   BILIRUBINUR Negative 03/12/2017 [place] 10/03/2017 2201   PROTEINUR NEGATIVE 10/03/2017 2201   UROBILINOGEN 0.2 03/12/2017 1011   NITRITE NEGATIVE 10/03/2017 2201   LEUKOCYTESUR NEGATIVE 10/03/2017 2201   LEUKOCYTESUR Trace 03/12/2017 1011     Radiological Exams on Admission: Dg Abd Portable 2 Views  Result Date: 10/03/2017 CLINICAL DATA:  Pelvic pain.  Cancer patient on chemotherapy. EXAM: PORTABLE ABDOMEN - 2 VIEW COMPARISON:  CT abdomen and pelvis dated 09/28/2017. FINDINGS: The bowel gas pattern is normal. There is no evidence of free air. No radio-opaque calculi or other significant radiographic abnormality is seen. Lung bases are clear. IMPRESSION: Negative. Please note that CT of 09/28/2017 showed progressive metastatic disease with worsening peritoneal implants, in addition to retroperitoneal lymphadenopathy, with worsening bilateral hydroureteronephrosis. Electronically Signed   By: Franki Cabot M.D.   On: 10/03/2017 20:21      Assessment/Plan Active Problems:   Endometrial cancer ([place])   Other constipation   Cancer associated pain   Leukopenia due to antineoplastic chemotherapy ([place])   Bilateral hydronephrosis    #) Acute on chronic: Most likely due to her cancer pain.  She has not had recent imaging in our system she reports that CT scan done by [facility] treatment [facility] [place] showed cancer apparently has responded to doxorubicin and has not been progressing as recently as 1 month ago. - Hydromorphone PCA continuous at 0.30, 1 mg demand every 12 minutes with 5 mg lockout - 0.5 mg/kg IV ketamine every 4 hours as needed regimen -Hold home oxycodone -Continue home MS Contin 60 mg every 12 - Diphenhydramine for pruritus, will consider naltrexone infusion if not improving  #) Opiate-induced constipation: Currently the patient does respond to laxatives.   -consider methylnaltrexone -PRN magnesium sulfate -Scheduled polyethylene glycol, docusate, Senokot  #) Stage IV metastatic uterine cancer: Unfortunately the patient is somewhat resistant to discussing goals of care and so wants to continue treatment.  : Gentle IV fluids Liquids: Monitor and supplement Nutrition: Regular diet  Prophylaxis:  Enoxaparin  Disposition: Pending oral pain control medications  Full code   Cristy Folks MD [facility]   If 7PM-7AM, please contact night-coverage [email or web address] Password [email or web address]  10/03/2017, 11:41 PM

## 2017-10-04 ENCOUNTER — Other Ambulatory Visit: Payer: Self-pay

## 2017-10-04 DIAGNOSIS — K5909 Other constipation: Secondary | ICD-10-CM

## 2017-10-04 DIAGNOSIS — Z515 Encounter for palliative care: Secondary | ICD-10-CM

## 2017-10-04 DIAGNOSIS — N1330 Unspecified hydronephrosis: Secondary | ICD-10-CM

## 2017-10-04 DIAGNOSIS — C541 Malignant neoplasm of endometrium: Principal | ICD-10-CM

## 2017-10-04 DIAGNOSIS — R52 Pain, unspecified: Secondary | ICD-10-CM

## 2017-10-04 DIAGNOSIS — G893 Neoplasm related pain (acute) (chronic): Secondary | ICD-10-CM | POA: Diagnosis present

## 2017-10-04 LAB — BASIC METABOLIC PANEL WITH GFR
BUN: 12 mg/dL (ref 6–20)
Chloride: 99 mmol/L — ABNORMAL LOW (ref 101–111)
GFR calc non Af Amer: >60 mL/min (ref >60)
Glucose, Bld: 105 mg/dL — ABNORMAL HIGH (ref 65–99)
Potassium: 3.6 mmol/L (ref 3.5–5.1)
Sodium: 137 mmol/L (ref 135–145)

## 2017-10-04 LAB — CBC
HCT: 22.2 % — ABNORMAL LOW (ref 36.0–46.0)
Hemoglobin: 7.4 g/dL — ABNORMAL LOW (ref 12.0–15.0)
MCH: 33 pg (ref 26.0–34.0)
MCHC: 33.3 g/dL (ref 30.0–36.0)
MCV: 99.1 fL (ref 78.0–100.0)
Platelets: 143 10*3/uL — ABNORMAL LOW (ref 150–400)
RBC: 2.24 MIL/uL — ABNORMAL LOW (ref 3.87–5.11)
RDW: 17 % — ABNORMAL HIGH (ref 11.5–15.5)
WBC: 1.8 K/uL — ABNORMAL LOW (ref 4.0–10.5)

## 2017-10-04 LAB — BASIC METABOLIC PANEL
Anion gap: 9 (ref 5–15)
CO2: 29 mmol/L (ref 22–32)
Calcium: 8.5 mg/dL — ABNORMAL LOW (ref 8.9–10.3)
Creatinine, Ser: 0.77 mg/dL (ref 0.44–1.00)
GFR calc Af Amer: >60 mL/min (ref >60)

## 2017-10-04 LAB — HIV ANTIBODY (ROUTINE TESTING W REFLEX): HIV Screen 4th Generation wRfx: NONREACTIVE

## 2017-10-04 MED ORDER — TAMOXIFEN CITRATE 10 MG PO TABS
20.0000 mg | ORAL_TABLET | Freq: Every day | ORAL | Status: DC
  Administered 2017-10-07 – 2017-10-08 (×2): 20 mg via ORAL
  Filled 2017-10-04 (×5): qty 2

## 2017-10-04 MED ORDER — NALOXONE HCL 0.4 MG/ML IJ SOLN: 0.4000 mg | INTRAMUSCULAR | Status: DC | PRN

## 2017-10-04 MED ORDER — PROMETHAZINE HCL 25 MG/ML IJ SOLN
12.5000 mg | Freq: Four times a day (QID) | INTRAMUSCULAR | Status: DC | PRN
  Filled 2017-10-04: qty 1

## 2017-10-04 MED ORDER — SODIUM CHLORIDE 0.9% FLUSH: 9.0000 mL | INTRAVENOUS | Status: DC | PRN

## 2017-10-04 MED ORDER — KETAMINE HCL 50 MG/5ML IJ SOSY: 0.5000 mg/kg | PREFILLED_SYRINGE | INTRAMUSCULAR | Status: DC | PRN

## 2017-10-04 MED ORDER — MELOXICAM 7.5 MG PO TABS
7.5000 mg | ORAL_TABLET | Freq: Every day | ORAL | Status: DC
  Administered 2017-10-04 – 2017-10-08 (×4): 7.5 mg via ORAL
  Filled 2017-10-04 (×5): qty 1

## 2017-10-04 MED ORDER — MAGNESIUM CITRATE PO SOLN
1.0000 | Freq: Every day | ORAL | Status: AC | PRN
  Administered 2017-10-05: 1 via ORAL
  Filled 2017-10-04: qty 296

## 2017-10-04 MED ORDER — DIPHENHYDRAMINE HCL 12.5 MG/5ML PO ELIX: 12.5000 mg | ORAL_SOLUTION | Freq: Four times a day (QID) | ORAL | Status: DC | PRN

## 2017-10-04 MED ORDER — ONDANSETRON HCL 4 MG/2ML IJ SOLN
4.0000 mg | Freq: Four times a day (QID) | INTRAMUSCULAR | Status: DC | PRN
  Administered 2017-10-05 (×2): 4 mg via INTRAVENOUS
  Filled 2017-10-04 (×4): qty 2

## 2017-10-04 MED ORDER — ENOXAPARIN SODIUM 40 MG/0.4ML ~~LOC~~ SOLN
40.0000 mg | Freq: Every day | SUBCUTANEOUS | Status: DC
  Administered 2017-10-04 – 2017-10-07 (×5): 40 mg via SUBCUTANEOUS
  Filled 2017-10-04 (×6): qty 0.4

## 2017-10-04 MED ORDER — DIPHENHYDRAMINE HCL 50 MG/ML IJ SOLN: 12.5000 mg | Freq: Four times a day (QID) | INTRAMUSCULAR | Status: DC | PRN

## 2017-10-04 MED ORDER — DOCUSATE SODIUM 100 MG PO CAPS: 100.0000 mg | ORAL_CAPSULE | Freq: Every day | ORAL | Status: DC | PRN

## 2017-10-04 MED ORDER — HYDROMORPHONE 1 MG/ML IV SOLN
INTRAVENOUS | Status: DC
  Administered 2017-10-04: 6.09 mg via INTRAVENOUS
  Administered 2017-10-04: 06:00:00 via INTRAVENOUS
  Administered 2017-10-04: 4.73 mg via INTRAVENOUS
  Filled 2017-10-04: qty 25

## 2017-10-04 MED ORDER — ONDANSETRON HCL 4 MG PO TABS: 4.0000 mg | ORAL_TABLET | Freq: Four times a day (QID) | ORAL | Status: DC | PRN

## 2017-10-04 MED ORDER — POLYETHYLENE GLYCOL 3350 17 G PO PACK
17.0000 g | PACK | Freq: Three times a day (TID) | ORAL | Status: DC
  Administered 2017-10-05 – 2017-10-07 (×4): 17 g via ORAL
  Filled 2017-10-04 (×5): qty 1

## 2017-10-04 MED ORDER — HYDROMORPHONE HCL 2 MG/ML IJ SOLN
2.0000 mg | INTRAMUSCULAR | Status: DC | PRN
  Administered 2017-10-04: 2 mg via INTRAVENOUS
  Filled 2017-10-04: qty 1

## 2017-10-04 MED ORDER — PROCHLORPERAZINE MALEATE 10 MG PO TABS
10.0000 mg | ORAL_TABLET | Freq: Four times a day (QID) | ORAL | Status: DC | PRN
  Filled 2017-10-04: qty 1

## 2017-10-04 MED ORDER — HYDROMORPHONE 1 MG/ML IV SOLN
INTRAVENOUS | Status: DC
  Administered 2017-10-04: 10.36 mg via INTRAVENOUS
  Administered 2017-10-04: 19:00:00 via INTRAVENOUS
  Administered 2017-10-05: 3.92 mg via INTRAVENOUS
  Administered 2017-10-05: 3.91 mg via INTRAVENOUS
  Administered 2017-10-05: 03:00:00 via INTRAVENOUS
  Administered 2017-10-05: 15.45 mg via INTRAVENOUS
  Administered 2017-10-05: 19.75 mg via INTRAVENOUS
  Administered 2017-10-05: 13:00:00 via INTRAVENOUS
  Filled 2017-10-04 (×3): qty 25

## 2017-10-04 MED ORDER — SENNA 8.6 MG PO TABS
2.0000 | ORAL_TABLET | Freq: Two times a day (BID) | ORAL | Status: DC
  Administered 2017-10-04 – 2017-10-07 (×5): 17.2 mg via ORAL
  Filled 2017-10-04 (×5): qty 2

## 2017-10-04 MED ORDER — SENNA 8.6 MG PO TABS
1.0000 | ORAL_TABLET | Freq: Two times a day (BID) | ORAL | Status: DC
  Administered 2017-10-04 (×2): 8.6 mg via ORAL
  Filled 2017-10-04 (×2): qty 1

## 2017-10-04 MED ORDER — MORPHINE SULFATE ER 30 MG PO TBCR
60.0000 mg | EXTENDED_RELEASE_TABLET | Freq: Two times a day (BID) | ORAL | Status: DC
  Administered 2017-10-04 (×2): 60 mg via ORAL
  Filled 2017-10-04 (×2): qty 4

## 2017-10-04 MED ORDER — SODIUM CHLORIDE 0.9 % IV SOLN
INTRAVENOUS | Status: AC
  Administered 2017-10-04 – 2017-10-06 (×6): via INTRAVENOUS

## 2017-10-04 NOTE — ED Notes (Signed)
Attempt report x1  

## 2017-10-04 NOTE — Progress Notes (Signed)
PROGRESS NOTE    Kerry Morris  ENI:[id number] DOB: Oct 11, 1965 DOA: 10/03/2017 PCP: Patient, No Pcp Per    Brief Narrative:   52 y.o. 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  As such I have consulted palliative care to assist with pain management.  Despite current pain medication regimen patient states that pain is still high.  She is currently on a Dilaudid PA pump.  Active Problems:   Endometrial cancer ([place]) - Management per her oncologists    Other constipation - Pt given magnesium citrate    Leukopenia due to antineoplastic chemotherapy (HCC)   Bilateral hydronephrosis   DVT prophylaxis: Lovenox Code Status: Full Family Communication: none at bedside Disposition Plan: pain control   Consultants:   Palliative medicine   Procedures: none   Antimicrobials: none   Subjective: Pt has no new complaints. No acute issues overnight.   Objective: Vitals:   10/04/17 1053 10/04/17 1156 10/04/17 1400 10/04/17 1408  BP: (!) 154/84 (!) 156/83  (!) 141/72  Pulse: 64 70  79  Resp: 19 17 11 15   Temp:      TempSrc:      SpO2: 100% 100% 100% 100%    Intake/Output Summary (Last 24 hours) at 10/04/2017 1412 Last data filed at 10/04/2017 0108 Gross per 24 hour  Intake 568.33 ml  Output -  Net 568.33 ml   There were no vitals filed for this visit.  Examination:  General exam: Appears calm, in nad.  Respiratory system: Clear to auscultation. Respiratory effort normal. Equal chest rise.  Cardiovascular system: S1 & S2 heard, RRR. No JVD, murmurs Gastrointestinal system: Abdomen is  nondistended, soft and nontender. No organomegaly or masses felt. Normal bowel sounds heard. Central nervous system: Alert and oriented. No focal neurological deficits. Extremities: Symmetric 5 x 5 power.  Skin: No rashes, lesions or ulcers, on limited exam. Psychiatry:  Mood & affect appropriate.     Data Reviewed: I have personally reviewed following labs and imaging studies  CBC: Recent Labs  Lab 09/27/17 2219 10/03/17 1927 10/04/17 0532  WBC 1.9* 1.8* 1.8*  NEUTROABS  --  0.3*  --   HGB 8.9* 8.1* 7.4*  HCT 26.0* 23.5* 22.2*  MCV 96.3 97.1 99.1  PLT 122* 158 614*   Basic Metabolic Panel: Recent Labs  Lab 09/27/17 2219 10/03/17 1927 10/04/17 0532  NA 132* 132* 137  K 3.9 3.7 3.6  CL 95* 93* 99*  CO2 27 28 29   GLUCOSE 140* 130* 105*  BUN 6 14 12   CREATININE 0.79 0.75 0.77  CALCIUM 8.9 9.0 8.5*   GFR: Estimated Creatinine Clearance: 62.1 mL/min (by C-G formula based on SCr of 0.77 mg/dL). Liver Function Tests: Recent Labs  Lab 09/27/17 2219 10/03/17 1927  AST 29 35  ALT 26 24  ALKPHOS 71 61  BILITOT 0.5 0.2*  PROT 6.7 6.6  ALBUMIN 3.4* 3.5   Recent Labs  Lab 09/27/17 2219 10/03/17 1927  LIPASE 20 21   No results for input(s): AMMONIA in the last 168 hours. Coagulation Profile: No results for input(s): INR, PROTIME in the last 168 hours. Cardiac Enzymes: No results for input(s): CKTOTAL,  CKMB, CKMBINDEX, TROPONINI in the last 168 hours. BNP (last 3 results) No results for input(s): PROBNP in the last 8760 hours. HbA1C: No results for input(s): HGBA1C in the last 72 hours. CBG: No results for input(s): GLUCAP in the last 168 hours. Lipid Profile: No results for input(s): CHOL, HDL, LDLCALC, TRIG, CHOLHDL, LDLDIRECT in the last 72 hours. Thyroid Function Tests: No results for input(s): TSH, T4TOTAL, FREET4, T3FREE, THYROIDAB in the last 72 hours. Anemia Panel: No results for input(s): VITAMINB12, FOLATE, FERRITIN, TIBC, IRON, RETICCTPCT in the  last 72 hours. Sepsis Labs: Recent Labs  Lab 10/03/17 1938  LATICACIDVEN 1.86    No results found for this or any previous visit (from the past 240 hour(s)).    Radiology Studies: Dg Abd Portable 2 Views  Result Date: 10/03/2017 CLINICAL DATA:  Pelvic pain.  Cancer patient on chemotherapy. EXAM: PORTABLE ABDOMEN - 2 VIEW COMPARISON:  CT abdomen and pelvis dated 09/28/2017. FINDINGS: The bowel gas pattern is normal. There is no evidence of free air. No radio-opaque calculi or other significant radiographic abnormality is seen. Lung bases are clear. IMPRESSION: Negative. Please note that CT of 09/28/2017 showed progressive metastatic disease with worsening peritoneal implants, in addition to retroperitoneal lymphadenopathy, with worsening bilateral hydroureteronephrosis. Electronically Signed   By: Franki Cabot M.D.   On: 10/03/2017 20:21    Scheduled Meds: . enoxaparin (LOVENOX) injection  40 mg Subcutaneous QHS  . HYDROmorphone   Intravenous Q4H  . meloxicam  7.5 mg Oral Daily  . morphine  60 mg Oral BID  . polyethylene glycol  17 g Oral TID  . senna  1 tablet Oral BID  . tamoxifen  20 mg Oral Daily   Continuous Infusions: . sodium chloride 75 mL/hr at 10/04/17 0108     LOS: 1 day    Time spent: 10 min  Velvet Bathe, MD [facility] Pager [phone]  If 7PM-7AM, please contact night-coverage [email or web address] Password [email or web address] 10/04/2017, 2:12 PM

## 2017-10-04 NOTE — Consult Note (Signed)
Palliative Care consult note  Reason for consult: Pain management in light of widely metastatic disease  I met today with Ms. Gorka.  She reports that she continues to have uncontrolled pain.  She states that she had been having better pain control the past several months, however, she has been having increasing pain to the left past several weeks.  We discussed her pain she reports in his lower pelvis and sometimes radiates down her leg.  She states that she has not found any exacerbating or relieving factors.  She states that on average her pain is 7-8 out of 10.  In the past, she reports pain medications of help but they are not doing so currently.  She reports her current regimen is MS Contin 60 mg twice daily and oxycodone 20 mg every 4 hours as needed.  She states this was released change from prior regimen of MS Contin 60 mg twice daily and oxycodone 10 mg as needed.  She has been seeking care in [place] at [facility] treatment [facility] [place] but presented to Dr. Calton Dach office with uncontrolled pain.  I reviewed her [place] PMP which revealed that she had been taking methadone 10 mg 3 times daily but this is now appears to have been tapered down to 1 times daily.  I discussed this with her and she reports that she actually start methadone around 7 or 8 days ago.  She had been to [facility] [place] and they wanted to work on transitioning her off of methadone to other pain medication regimen.  Reviewed her usage over the past 24 hours and she has had the equivalent of approximately 34 mg of Dilaudid.  This includes 16 mg of Dilaudid from PCA pump, 12 milligrams of IV Dilaudid from PRN boluses, and 120 mg of MS Contin which is equivalent to approximately 6 mg of IV Dilaudid.  Despite this, she reports he continues to have uncontrolled pain.  - It is unclear to me why she was rotated off of methadone, but I believe this change is likely why she has had increasing pain.   She reports that she does not want to restart methadone as she was told that this is something she should transition off of.  As there is not a well-established ratio for transition off of methadone to other pain medications and she has been having poorly controlled pain on combination of MS Contin and oxycodone, I recommend that we continue with rotation to Dilaudid via PCA as a single agent for at least the next 24 to 48 hours with an attempt to try and determine what her overall opioid needs will be.    - Will increase her basal rate on PCA to 1 mg every hour and also change bolus lockout from every 12 to every 15 minutes.  With this, she can continue to get up to 5 mg of Dilaudid per hour if it is necessary to control her pain.  She understands better now the need to use PCA in order to help [place] gauge her overall pain management needs.  - Follow up tomorrow with plan further adjustments based on usage overnight.  -She is also constipated.  She has had mag citrate and reports that she feels that she is getting the urge for bowel movement.  I reviewed her abdominal x-ray and it appears that she is having stool predominantly in the right lower colon.  If she does not have bowel movement mag citrate, I am  going consideration for a trial of Relistor tomorrow.  Total time: 60 minutes Greater than 50%  of this time was spent counseling and coordinating care related to the above assessment and plan.  Micheline Rough, MD [place] Team [phone]

## 2017-10-04 NOTE — ED Notes (Signed)
ED TO INPATIENT HANDOFF REPORT  Name/Age/Gender Kerry Morris 52 y.o. female  Code Status    Code Status Orders  (From admission, onward)        Start     Ordered   10/04/17 0044  Full code  Continuous     10/04/17 0043    Code Status History    Date Active Date Inactive Code Status Order ID Comments User Context   04/06/2015 0152 04/09/2015 2123 Full Code [id number]  Constant, Vickii Chafe, MD ED      Home/SNF/Other Home  Chief Complaint Blue card, Ca pt, Pain medicine for cancer  Level of Care/Admitting Diagnosis ED Disposition    ED Disposition Condition [place]: [facility] [100102]  Level of Care: Telemetry [5]  Admit to tele based on following criteria: Other see comments  Comments: aggressive pain medication control  Diagnosis: Cancer related pain [256389]  Admitting Physician: Velvet Bathe [[id number]]  Attending Physician: Velvet Bathe [phone]  Estimated length of stay: 3 - 4 days  Certification:: I certify there are rare and unusual circumstances requiring inpatient admission  PT Class (Do Not Modify): Inpatient [101]  PT Acc Code (Do Not Modify): Private [1]       Medical History Past Medical History:  Diagnosis Date  . Anxiety   . Anxiety and depression   . Cancer [facility])    uterine cancer  . Depression   . Genetic testing 01/10/2017   Ms. Piggott underwent genetic counseling and testing for hereditary cancer syndromes on 12/26/2016. Her results were negative for mutations in all 83 genes analyzed by [facility]'s 83-gene Multi-Cancers Panel. Genes analyzed include: ALK, APC, ATM, AXIN2, BAP1, BARD1, BLM, BMPR1A, BRCA1, BRCA2, BRIP1, CASR, CDC73, CDH1, CDK4, CDKN1B, CDKN1C, CDKN2A, CEBPA, CHEK2, CTNNA1, DICER1, DIS3L2, EGFR, EPCAM, F    Allergies No Known Allergies  IV Location/Drains/Wounds Patient Lines/Drains/Airways Status   Active Line/Drains/Airways    Name:   Placement date:   Placement time:   Site:   Days:    Implanted Port 04/23/17 Right Chest   04/23/17    0958    Chest   164   Closed System Drain 1 Right Buttock Other (Comment) 10 Fr.   04/06/15    1612    Buttock   912   Closed System Drain 2 Right Buttock Other (Comment) 10 Fr.   04/06/15    1655    Buttock   912          Labs/Imaging Results for orders placed or performed during the hospital encounter of 10/03/17 (from the past 48 hour(s))  CBC with Differential     Status: Abnormal   Collection Time: 10/03/17  7:27 PM  Result Value Ref Range   WBC 1.8 (L) 4.0 - 10.5 K/uL   RBC 2.42 (L) 3.87 - 5.11 MIL/uL   Hemoglobin 8.1 (L) 12.0 - 15.0 g/dL   HCT 23.5 (L) 36.0 - 46.0 %   MCV 97.1 78.0 - 100.0 fL   MCH 33.5 26.0 - 34.0 pg   MCHC 34.5 30.0 - 36.0 g/dL   RDW 16.6 (H) 11.5 - 15.5 %   Platelets 158 150 - 400 K/uL   Neutrophils Relative % 17 %   Lymphocytes Relative 62 %   Monocytes Relative 19 %   Eosinophils Relative 1 %   Basophils Relative 1 %   Neutro Abs 0.3 (L) 1.7 - 7.7 K/uL   Lymphs Abs 1.2 0.7 - 4.0 K/uL  Monocytes Absolute 0.3 0.1 - 1.0 K/uL   Eosinophils Absolute 0.0 0.0 - 0.7 K/uL   Basophils Absolute 0.0 0.0 - 0.1 K/uL   WBC Morphology WHITE COUNT CONFIRMED ON SMEAR     Comment: Performed at [facility], [place] [address], [place], [place] [address]  Comprehensive metabolic panel     Status: Abnormal   Collection Time: 10/03/17  7:27 PM  Result Value Ref Range   Sodium 132 (L) 135 - 145 mmol/L   Potassium 3.7 3.5 - 5.1 mmol/L   Chloride 93 (L) 101 - 111 mmol/L   CO2 28 22 - 32 mmol/L   Glucose, Bld 130 (H) 65 - 99 mg/dL   BUN 14 6 - 20 mg/dL   Creatinine, Ser 0.75 0.44 - 1.00 mg/dL   Calcium 9.0 8.9 - 10.3 mg/dL   Total Protein 6.6 6.5 - 8.1 g/dL   Albumin 3.5 3.5 - 5.0 g/dL   AST 35 15 - 41 U/L   ALT 24 14 - 54 U/L   Alkaline Phosphatase 61 38 - 126 U/L   Total Bilirubin 0.2 (L) 0.3 - 1.2 mg/dL   GFR calc non Af Amer >60 >60 mL/min   GFR calc Af Amer >60 >60 mL/min    Comment:  (NOTE) The eGFR has been calculated using the CKD EPI equation. This calculation has not been validated in all clinical situations. eGFR's persistently <60 mL/min signify possible Chronic Kidney Disease.    Anion gap 11 5 - 15    Comment: Performed at [facility], [place] [address], [place], [place] [address]  Lipase, blood     Status: None   Collection Time: 10/03/17  7:27 PM  Result Value Ref Range   Lipase 21 11 - 51 U/L    Comment: Performed at [facility], [place] [address] [place], [place] [address]  I-Stat CG4 Lactic Acid, ED     Status: None   Collection Time: 10/03/17  7:38 PM  Result Value Ref Range   Lactic Acid, Venous 1.86 0.5 - 1.9 mmol/L  Urinalysis, Routine w reflex microscopic     Status: Abnormal   Collection Time: 10/03/17 10:01 PM  Result Value Ref Range   Color, Urine YELLOW YELLOW   APPearance CLOUDY (A) CLEAR   Specific Gravity, Urine 1.016 1.005 - 1.030   pH 6.0 5.0 - 8.0   Glucose, UA NEGATIVE NEGATIVE mg/dL   Hgb urine dipstick NEGATIVE NEGATIVE   Bilirubin Urine NEGATIVE NEGATIVE   Ketones, ur NEGATIVE NEGATIVE mg/dL   Protein, ur NEGATIVE NEGATIVE mg/dL   Nitrite NEGATIVE NEGATIVE   Leukocytes, UA NEGATIVE NEGATIVE    Comment: Performed at [place] [address], [place], [place] [address]  HIV antibody (Routine Testing)     Status: None   Collection Time: 10/04/17  5:32 AM  Result Value Ref Range   HIV Screen 4th Generation wRfx Non Reactive Non Reactive    Comment: (NOTE) Performed At: [facility] [place], [place] [id number] Rush Farmer MD UT:[id number] Performed at [facility], [place] [address] [place], [place] [address]   Basic metabolic panel     Status: Abnormal   Collection Time: 10/04/17  5:32 AM  Result Value Ref Range   Sodium 137 135 - 145 mmol/L   Potassium 3.6 3.5 - 5.1 mmol/L   Chloride 99 (L) 101 - 111 mmol/L   CO2 29 22  - 32 mmol/L  Glucose, Bld 105 (H) 65 - 99 mg/dL   BUN 12 6 - 20 mg/dL   Creatinine, Ser 0.77 0.44 - 1.00 mg/dL   Calcium 8.5 (L) 8.9 - 10.3 mg/dL   GFR calc non Af Amer >60 >60 mL/min   GFR calc Af Amer >60 >60 mL/min    Comment: (NOTE) The eGFR has been calculated using the CKD EPI equation. This calculation has not been validated in all clinical situations. eGFR's persistently <60 mL/min signify possible Chronic Kidney Disease.    Anion gap 9 5 - 15    Comment: Performed at [facility], [place] [address] [place], [place] [address]  CBC     Status: Abnormal   Collection Time: 10/04/17  5:32 AM  Result Value Ref Range   WBC 1.8 (L) 4.0 - 10.5 K/uL   RBC 2.24 (L) 3.87 - 5.11 MIL/uL   Hemoglobin 7.4 (L) 12.0 - 15.0 g/dL   HCT 22.2 (L) 36.0 - 46.0 %   MCV 99.1 78.0 - 100.0 fL   MCH 33.0 26.0 - 34.0 pg   MCHC 33.3 30.0 - 36.0 g/dL   RDW 17.0 (H) 11.5 - 15.5 %   Platelets 143 (L) 150 - 400 K/uL    Comment: Performed at [facility], [place] [address], [place], [place] [address]   Dg Abd Portable 2 Views  Result Date: 10/03/2017 CLINICAL DATA:  Pelvic pain.  Cancer patient on chemotherapy. EXAM: PORTABLE ABDOMEN - 2 VIEW COMPARISON:  CT abdomen and pelvis dated 09/28/2017. FINDINGS: The bowel gas pattern is normal. There is no evidence of free air. No radio-opaque calculi or other significant radiographic abnormality is seen. Lung bases are clear. IMPRESSION: Negative. Please note that CT of 09/28/2017 showed progressive metastatic disease with worsening peritoneal implants, in addition to retroperitoneal lymphadenopathy, with worsening bilateral hydroureteronephrosis. Electronically Signed   By: Franki Cabot M.D.   On: 10/03/2017 20:21    Pending Labs Unresulted Labs (From admission, onward)   None      Vitals/Pain Today's Vitals   10/04/17 1400 10/04/17 1408 10/04/17 1511 10/04/17 1614  BP:  (!) 141/72 (!) 175/135 (!) 154/82  Pulse:  79 77 91   Resp: 11 15 16  (!) 23  Temp:      TempSrc:      SpO2: 100% 100% 100% 100%  PainSc: 10-Worst pain ever       Isolation Precautions No active isolations  Medications Medications  docusate sodium (COLACE) capsule 100 mg (has no administration in time range)  meloxicam (MOBIC) tablet 7.5 mg (7.5 mg Oral Given 10/04/17 1414)  prochlorperazine (COMPAZINE) tablet 10 mg (has no administration in time range)  tamoxifen (NOLVADEX) tablet 20 mg (20 mg Oral Refused 10/04/17 1405)  naloxone (NARCAN) injection 0.4 mg (has no administration in time range)    And  sodium chloride flush (NS) 0.9 % injection 9 mL (has no administration in time range)  diphenhydrAMINE (BENADRYL) injection 12.5 mg (has no administration in time range)    Or  diphenhydrAMINE (BENADRYL) 12.5 MG/5ML elixir 12.5 mg (has no administration in time range)  enoxaparin (LOVENOX) injection 40 mg (40 mg Subcutaneous Given 10/04/17 0220)  0.9 %  sodium chloride infusion ( Intravenous New Bag/Given 10/04/17 1500)  polyethylene glycol (MIRALAX / GLYCOLAX) packet 17 g (17 g Oral Refused 10/04/17 1405)  magnesium citrate solution 1 Bottle (has no administration in time range)  ondansetron (ZOFRAN) tablet 4 mg (has no administration in time range)  Or  ondansetron (ZOFRAN) injection 4 mg (has no administration in time range)  promethazine (PHENERGAN) injection 12.5 mg (has no administration in time range)  HYDROmorphone (DILAUDID) injection 2 mg (2 mg Intravenous Given 10/04/17 0221)  HYDROmorphone (DILAUDID) 1 mg/mL PCA injection (has no administration in time range)  senna (SENOKOT) tablet 17.2 mg (has no administration in time range)  ondansetron (ZOFRAN) injection 4 mg (4 mg Intravenous Given 10/03/17 1926)  HYDROmorphone (DILAUDID) injection 2 mg (2 mg Intravenous Given 10/03/17 1926)  0.9 %  sodium chloride infusion ( Intravenous Stopped 10/04/17 0108)  HYDROmorphone (DILAUDID) injection 2 mg (2 mg Intravenous Given 10/03/17 2043)   HYDROmorphone (DILAUDID) injection 2 mg (2 mg Intravenous Given 10/03/17 2149)  HYDROmorphone (DILAUDID) injection 2 mg (2 mg Intravenous Given 10/03/17 2339)  diphenhydrAMINE (BENADRYL) injection 25 mg (25 mg Intravenous Given 10/03/17 2338)  magnesium citrate solution 1 Bottle (1 Bottle Oral Given 10/04/17 0033)    Mobility walks

## 2017-10-05 DIAGNOSIS — R110 Nausea: Secondary | ICD-10-CM

## 2017-10-05 MED ORDER — HYDROMORPHONE 1 MG/ML IV SOLN
INTRAVENOUS | Status: DC
  Administered 2017-10-05: 22:00:00 via INTRAVENOUS
  Administered 2017-10-05: 8.15 mg via INTRAVENOUS
  Administered 2017-10-06: 6.75 mg via INTRAVENOUS
  Administered 2017-10-06: 11.56 mg via INTRAVENOUS
  Administered 2017-10-06: 08:00:00 via INTRAVENOUS
  Administered 2017-10-06: 13.14 mg via INTRAVENOUS
  Administered 2017-10-06: 10.44 mg via INTRAVENOUS
  Administered 2017-10-06: 10.76 mg via INTRAVENOUS
  Administered 2017-10-06: 10.13 mg via INTRAVENOUS
  Administered 2017-10-06 – 2017-10-07 (×2): via INTRAVENOUS
  Administered 2017-10-07: 9.81 mg via INTRAVENOUS
  Filled 2017-10-05 (×4): qty 25

## 2017-10-05 MED ORDER — METOCLOPRAMIDE HCL 5 MG/ML IJ SOLN
10.0000 mg | Freq: Three times a day (TID) | INTRAMUSCULAR | Status: DC
  Administered 2017-10-05 – 2017-10-08 (×11): 10 mg via INTRAVENOUS
  Filled 2017-10-05 (×11): qty 2

## 2017-10-05 MED ORDER — WITCH HAZEL-GLYCERIN EX PADS
MEDICATED_PAD | CUTANEOUS | Status: DC | PRN
  Administered 2017-10-05: 1 via TOPICAL
  Filled 2017-10-05: qty 100

## 2017-10-05 NOTE — Progress Notes (Signed)
Palliative care progress note  Reason for consult: Pain management in light of chronic disease  I met today with Kerry Morris.  She reports that pain seems to have been doing a little bit better since initiation of PCA but she did wake up overnight with pain on more than one occasion.  Today she reports that her pain seemed to worsen again, however, at the same time she reports she has been having more nausea as well.    I reviewed her PCA and she is used  62 mg of morphine last 24 hours.  She has been averaging around 2 mg/h for the last 12 hours.  She also reports that her nausea continues to be worse today.  She seems to be having some early satiety and also feels as though anytime she eats that she wants to vomit.  -We will plan for initiation of Reglan on a scheduled basis prior to meals and at night.  Discontinue Phenergan and Compazine.  Also continue with Zofran as needed.  -For pain, will plan to increase her basal rate to 1.5 mg while maintaining bolus dosing of 1 mg every 15 minutes as needed.  Overall, she continues to have very high opioid requirements.  I discussed with her again about options we are going to have for longterm long-acting agents including possibility of restarting methadone (I do think this would have been the best plan moving forward but she does not want to do at this point) versus trial of another long-acting medication such as fentanyl.  I am hoping with an increase basal rate she will feel better and will have a better idea of her overall needs to work on starting transition to outpatient regimen tomorrow.  Total time: 40 minutes Greater than 50%  of this time was spent counseling and coordinating care related to the above assessment and plan.  Micheline Rough, MD [place] Team [phone]

## 2017-10-05 NOTE — Progress Notes (Signed)
PROGRESS NOTE    Kerry Morris  VZC:[id number] DOB: 17-Nov-1965 DOA: 10/03/2017 PCP: Patient, No Pcp Per    Brief Narrative:   52 y.o. 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  - Consulted palliative care for pain control  Active Problems:   Endometrial cancer ([place]) - Management per her oncologists    Other constipation - Pt given magnesium citrate    Leukopenia due to antineoplastic chemotherapy ([place])   Bilateral hydronephrosis  DVT prophylaxis: Lovenox Code Status: Full Family Communication: none at bedside Disposition Plan: once pain medication regimen set up will plan on discharging.   Consultants:   Palliative medicine   Procedures: none   Antimicrobials: none   Subjective: Reports pain is better controlled currently.   Objective: Vitals:   10/05/17 1008 10/05/17 1206 10/05/17 1323 10/05/17 1328  BP:   (!) 154/81   Pulse:   72   Resp: 14 20 16 12   Temp:   (!) 97.5 F (36.4 C)   TempSrc:   Oral   SpO2: 97% 98%  100%  Weight:      Height:        Intake/Output Summary (Last 24 hours) at 10/05/2017 1358 Last data filed at 10/05/2017 0200 Gross per 24 hour  Intake 1865 ml  Output -  Net 1865 ml   Filed Weights   10/04/17 1743  Weight: 50.1 kg (110 lb 7.2 oz)    Examination:  General exam: Appears calm, in nad.  Respiratory system: Clear to auscultation. Respiratory effort normal. Equal chest rise.  Cardiovascular system: S1 & S2 heard, RRR. No JVD, murmurs Gastrointestinal system: Abdomen is nondistended, soft and nontender. No organomegaly or masses felt. Normal  bowel sounds heard. Central nervous system: Alert and oriented. No focal neurological deficits. Extremities: Symmetric 5 x 5 power.  Skin: No rashes, lesions or ulcers, on limited exam. Psychiatry:  Mood & affect appropriate.    Data Reviewed: I have personally reviewed following labs and imaging studies  CBC: Recent Labs  Lab 10/03/17 1927 10/04/17 0532  WBC 1.8* 1.8*  NEUTROABS 0.3*  --   HGB 8.1* 7.4*  HCT 23.5* 22.2*  MCV 97.1 99.1  PLT 158 128*   Basic Metabolic Panel: Recent Labs  Lab 10/03/17 1927 10/04/17 0532  NA 132* 137  K 3.7 3.6  CL 93* 99*  CO2 28 29  GLUCOSE 130* 105*  BUN 14 12  CREATININE 0.75 0.77  CALCIUM 9.0 8.5*   GFR: Estimated Creatinine Clearance: 65.1 mL/min (by C-G formula based on SCr of 0.77 mg/dL). Liver Function Tests: Recent Labs  Lab 10/03/17 1927  AST 35  ALT 24  ALKPHOS 61  BILITOT 0.2*  PROT 6.6  ALBUMIN 3.5   Recent Labs  Lab 10/03/17 1927  LIPASE 21   No results for input(s): AMMONIA in the last 168 hours. Coagulation Profile: No results for input(s): INR, PROTIME in the last 168 hours. Cardiac Enzymes: No results for input(s): CKTOTAL, CKMB, CKMBINDEX, TROPONINI in the last 168 hours. BNP (last 3 results) No results for input(s): PROBNP in the last 8760 hours. HbA1C: No results for input(s): HGBA1C in the last 72 hours. CBG:  No results for input(s): GLUCAP in the last 168 hours. Lipid Profile: No results for input(s): CHOL, HDL, LDLCALC, TRIG, CHOLHDL, LDLDIRECT in the last 72 hours. Thyroid Function Tests: No results for input(s): TSH, T4TOTAL, FREET4, T3FREE, THYROIDAB in the last 72 hours. Anemia Panel: No results for input(s): VITAMINB12, FOLATE, FERRITIN, TIBC, IRON, RETICCTPCT in the last 72 hours. Sepsis Labs: Recent Labs  Lab 10/03/17 1938  LATICACIDVEN 1.86    No results found for this or any previous visit (from the past 240 hour(s)).    Radiology Studies: Dg Abd Portable 2 Views  Result  Date: 10/03/2017 CLINICAL DATA:  Pelvic pain.  Cancer patient on chemotherapy. EXAM: PORTABLE ABDOMEN - 2 VIEW COMPARISON:  CT abdomen and pelvis dated 09/28/2017. FINDINGS: The bowel gas pattern is normal. There is no evidence of free air. No radio-opaque calculi or other significant radiographic abnormality is seen. Lung bases are clear. IMPRESSION: Negative. Please note that CT of 09/28/2017 showed progressive metastatic disease with worsening peritoneal implants, in addition to retroperitoneal lymphadenopathy, with worsening bilateral hydroureteronephrosis. Electronically Signed   By: Franki Cabot M.D.   On: 10/03/2017 20:21    Scheduled Meds: . enoxaparin (LOVENOX) injection  40 mg Subcutaneous QHS  . HYDROmorphone   Intravenous Q4H  . meloxicam  7.5 mg Oral Daily  . polyethylene glycol  17 g Oral TID  . senna  2 tablet Oral BID  . tamoxifen  20 mg Oral Daily   Continuous Infusions: . sodium chloride 75 mL/hr at 10/05/17 0359     LOS: 2 days   Time spent: 10 min  Velvet Bathe, MD [facility] Pager [phone]  If 7PM-7AM, please contact night-coverage [email or web address] Password [email or web address] 10/05/2017, 1:58 PM

## 2017-10-06 MED ORDER — FENTANYL 100 MCG/HR TD PT72
300.0000 ug | MEDICATED_PATCH | TRANSDERMAL | Status: DC
  Administered 2017-10-06: 300 ug via TRANSDERMAL
  Filled 2017-10-06: qty 3

## 2017-10-06 NOTE — Progress Notes (Signed)
Less than 0.5 ml to waste in previous Dilaudid PCA syringe. Wasted in sink with RN Barnetta Chapel.

## 2017-10-06 NOTE — Progress Notes (Addendum)
Palliative care progress note  Reason for consult: Pain management in light of chronic disease  I met today with Kerry Morris.  She reports that pain is doing better with increase in basal rate.    I reviewed her PCA and she again used 62 mg of morphine last 24 hours.  This is equivalent to 1240 mg of oral morphine.    She reports that she is still not interested in restarting methadone, and we will therefore work to transition to fentanyl patch for long acting agent.  Based on usage of the equivalent of 1240 mg of oral morphine, will plan to start fentanyl 369mg/hr.    She reports that she need to be working to discharge in order to catch a flight on Tuesday to get chemotherapy in [place]    -For pain, will plan to start fentanyl 3064m/hr this evening.  Will decrease basal rate by 50% (to 0.7580m6 hours after starting and discontinue basal rate 12 hours after starting fentanyl patch.  Will then try to transition off of PCA tomorrow as she is requesting discharge in order to [place]: [place] Greater than 50%  of this time was spent counseling and coordinating care related to the above assessment and plan.  GenMicheline RoughD [facility] [phone]

## 2017-10-06 NOTE — Progress Notes (Signed)
PROGRESS NOTE    Kerry Morris  TGG:[id number] DOB: 1965-08-16 DOA: 10/03/2017 PCP: Patient, No Pcp Per    Brief Narrative:   52 y.o. 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  - Palliative care assisting for pain control.  Active Problems:   Endometrial cancer ([place]) - Management per her oncologists    Other constipation - Pt given magnesium citrate    Leukopenia due to antineoplastic chemotherapy ([facility])   Bilateral hydronephrosis  DVT prophylaxis: Lovenox Code Status: Full Family Communication: none at bedside Disposition Plan: once pain medication regimen set up will plan on discharging.   Consultants:   Palliative medicine   Procedures: none   Antimicrobials: none   Subjective: Reports pain is better controlled currently.   Objective: Vitals:   10/06/17 0920 10/06/17 0920 10/06/17 1248 10/06/17 1258  BP: 123/72 123/72  (!) 166/86  Pulse: 80 83  87  Resp: 16  18 16   Temp: 98 F (36.7 C) 98 F (36.7 C)  97.8 F (36.6 C)  TempSrc: Oral Oral  Oral  SpO2: 95% 98% 98% 100%  Weight:      Height:        Intake/Output Summary (Last 24 hours) at 10/06/2017 1438 Last data filed at 10/06/2017 1335 Gross per 24 hour  Intake 2640 ml  Output -  Net 2640 ml   Filed Weights   10/04/17 1743  Weight: 50.1 kg (110 lb 7.2 oz)    Examination:  General exam: Appears calm, in nad.  Respiratory system: Clear to auscultation. Respiratory effort normal. Equal chest rise.  Cardiovascular system: S1 & S2 heard, RRR. No JVD, murmurs Gastrointestinal system: Abdomen is nondistended, soft and  nontender. No organomegaly or masses felt. Normal bowel sounds heard. Central nervous system: Alert and oriented. No focal neurological deficits. Extremities: Symmetric 5 x 5 power.  Skin: No rashes, lesions or ulcers, on limited exam. Psychiatry:  Mood & affect appropriate.    Data Reviewed: I have personally reviewed following labs and imaging studies  CBC: Recent Labs  Lab 10/03/17 1927 10/04/17 0532  WBC 1.8* 1.8*  NEUTROABS 0.3*  --   HGB 8.1* 7.4*  HCT 23.5* 22.2*  MCV 97.1 99.1  PLT 158 703*   Basic Metabolic Panel: Recent Labs  Lab 10/03/17 1927 10/04/17 0532  NA 132* 137  K 3.7 3.6  CL 93* 99*  CO2 28 29  GLUCOSE 130* 105*  BUN 14 12  CREATININE 0.75 0.77  CALCIUM 9.0 8.5*   GFR: Estimated Creatinine Clearance: 65.1 mL/min (by C-G formula based on SCr of 0.77 mg/dL). Liver Function Tests: Recent Labs  Lab 10/03/17 1927  AST 35  ALT 24  ALKPHOS 61  BILITOT 0.2*  PROT 6.6  ALBUMIN 3.5   Recent Labs  Lab 10/03/17 1927  LIPASE 21   No results for input(s): AMMONIA in the last 168 hours. Coagulation Profile: No results for input(s): INR, PROTIME in the last 168 hours. Cardiac Enzymes: No results for input(s): CKTOTAL, CKMB, CKMBINDEX, TROPONINI in the last 168 hours. BNP (last 3 results) No results for input(s): PROBNP in the last 8760 hours. HbA1C: No results for input(s): HGBA1C in  the last 72 hours. CBG: No results for input(s): GLUCAP in the last 168 hours. Lipid Profile: No results for input(s): CHOL, HDL, LDLCALC, TRIG, CHOLHDL, LDLDIRECT in the last 72 hours. Thyroid Function Tests: No results for input(s): TSH, T4TOTAL, FREET4, T3FREE, THYROIDAB in the last 72 hours. Anemia Panel: No results for input(s): VITAMINB12, FOLATE, FERRITIN, TIBC, IRON, RETICCTPCT in the last 72 hours. Sepsis Labs: Recent Labs  Lab 10/03/17 1938  LATICACIDVEN 1.86    No results found for this or any previous visit (from the past 240 hour(s)).     Radiology Studies: No results found.  Scheduled Meds: . enoxaparin (LOVENOX) injection  40 mg Subcutaneous QHS  . HYDROmorphone   Intravenous Q4H  . meloxicam  7.5 mg Oral Daily  . metoCLOPramide (REGLAN) injection  10 mg Intravenous TID AC & HS  . polyethylene glycol  17 g Oral TID  . senna  2 tablet Oral BID  . tamoxifen  20 mg Oral Daily   Continuous Infusions: . sodium chloride 75 mL/hr at 10/06/17 0401     LOS: 3 days   Time spent: 10 min  Velvet Bathe, MD [facility] Pager [phone]  If 7PM-7AM, please contact night-coverage [email or web address] Password [facility] 10/06/2017, 2:38 PM

## 2017-10-07 MED ORDER — BISACODYL 5 MG PO TBEC
5.0000 mg | DELAYED_RELEASE_TABLET | Freq: Every day | ORAL | Status: DC | PRN
  Administered 2017-10-07: 5 mg via ORAL
  Filled 2017-10-07: qty 1

## 2017-10-07 MED ORDER — HYDROMORPHONE 1 MG/ML IV SOLN
INTRAVENOUS | Status: DC
  Administered 2017-10-07: 19.61 mg via INTRAVENOUS

## 2017-10-07 MED ORDER — METHYLNALTREXONE BROMIDE 12 MG/0.6ML ~~LOC~~ SOLN
8.0000 mg | Freq: Once | SUBCUTANEOUS | Status: AC
  Administered 2017-10-07: 8 mg via SUBCUTANEOUS
  Filled 2017-10-07: qty 0.6

## 2017-10-07 MED ORDER — HYDROMORPHONE 1 MG/ML IV SOLN
INTRAVENOUS | Status: DC
  Administered 2017-10-07: 12:00:00 via INTRAVENOUS
  Administered 2017-10-07: 13.68 mg via INTRAVENOUS
  Administered 2017-10-07: 9 mg via INTRAVENOUS
  Administered 2017-10-07: 5 mg via INTRAVENOUS
  Administered 2017-10-08: 7 mg via INTRAVENOUS
  Administered 2017-10-08: 3 mg via INTRAVENOUS
  Administered 2017-10-08: 9 mg via INTRAVENOUS
  Administered 2017-10-08: 8 mg via INTRAVENOUS
  Filled 2017-10-07 (×2): qty 25

## 2017-10-07 MED ORDER — SODIUM CHLORIDE 0.9% FLUSH
10.0000 mL | Freq: Two times a day (BID) | INTRAVENOUS | Status: DC
  Administered 2017-10-08: 10 mL

## 2017-10-07 NOTE — Progress Notes (Signed)
PROGRESS NOTE    Kerry Morris  VOZ:[id number] DOB: [birth year] DOA: 10/03/2017 PCP: Patient, No Pcp Per    Brief Narrative:   52 y.o. 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  - Palliative care assisting with pain control. Plan is for early discharge next am so patient can go to her cancer center in [place] for further medication adjustments  Active Problems:   Endometrial cancer ([place]) - Management per her oncologists    Other constipation - Pt given magnesium citrate    Leukopenia due to antineoplastic chemotherapy ([place])   Bilateral hydronephrosis  DVT prophylaxis: Lovenox Code Status: Full Family Communication: none at bedside Disposition Plan: once pain medication regimen set up will plan on discharging.   Consultants:   Palliative medicine   Procedures: none   Antimicrobials: none   Subjective: Had difficulty with fentanyl pain patch  Objective: Vitals:   10/07/17 0828 10/07/17 1050 10/07/17 1156 10/07/17 1304  BP: (!) 169/99   (!) 169/101  Pulse: 93   88  Resp: 14 14 12 12   Temp: 98.3 F (36.8 C)   98.3 F (36.8 C)  TempSrc: Oral   Oral  SpO2: 98% 95% 95% 96%  Weight:      Height:        Intake/Output Summary (Last 24 hours) at 10/07/2017 1336 Last data filed at 10/06/2017 2200 Gross per 24 hour  Intake 1600 ml  Output -  Net 1600 ml   Filed Weights   10/04/17 1743  Weight: 50.1 kg (110 lb 7.2 oz)    Examination:  General exam: Appears calm, in nad.  Respiratory system: Clear to auscultation. Respiratory effort normal. Equal chest rise.  Cardiovascular system:  S1 & S2 heard, RRR. No JVD, murmurs Gastrointestinal system: Abdomen is nondistended, soft and nontender. No organomegaly or masses felt. Normal bowel sounds heard. Central nervous system: Alert and oriented. No focal neurological deficits. Extremities: Symmetric 5 x 5 power.  Skin: No rashes, lesions or ulcers, on limited exam. Psychiatry:  Mood & affect appropriate.    Data Reviewed: I have personally reviewed following labs and imaging studies  CBC: Recent Labs  Lab 10/03/17 1927 10/04/17 0532  WBC 1.8* 1.8*  NEUTROABS 0.3*  --   HGB 8.1* 7.4*  HCT 23.5* 22.2*  MCV 97.1 99.1  PLT 158 425*   Basic Metabolic Panel: Recent Labs  Lab 10/03/17 1927 10/04/17 0532  NA 132* 137  K 3.7 3.6  CL 93* 99*  CO2 28 29  GLUCOSE 130* 105*  BUN 14 12  CREATININE 0.75 0.77  CALCIUM 9.0 8.5*   GFR: Estimated Creatinine Clearance: 65.1 mL/min (by C-G formula based on SCr of 0.77 mg/dL). Liver Function Tests: Recent Labs  Lab 10/03/17 1927  AST 35  ALT 24  ALKPHOS 61  BILITOT 0.2*  PROT 6.6  ALBUMIN 3.5   Recent Labs  Lab 10/03/17 1927  LIPASE 21   No results for input(s): AMMONIA in the last 168 hours. Coagulation Profile: No results for input(s): INR, PROTIME in the last 168 hours. Cardiac Enzymes: No results for input(s): CKTOTAL, CKMB, CKMBINDEX, TROPONINI in the last 168 hours. BNP (last 3  results) No results for input(s): PROBNP in the last 8760 hours. HbA1C: No results for input(s): HGBA1C in the last 72 hours. CBG: No results for input(s): GLUCAP in the last 168 hours. Lipid Profile: No results for input(s): CHOL, HDL, LDLCALC, TRIG, CHOLHDL, LDLDIRECT in the last 72 hours. Thyroid Function Tests: No results for input(s): TSH, T4TOTAL, FREET4, T3FREE, THYROIDAB in the last 72 hours. Anemia Panel: No results for input(s): VITAMINB12, FOLATE, FERRITIN, TIBC, IRON, RETICCTPCT in the last 72 hours. Sepsis Labs: Recent Labs  Lab 10/03/17 1938  LATICACIDVEN  1.86    No results found for this or any previous visit (from the past 240 hour(s)).    Radiology Studies: No results found.  Scheduled Meds: . enoxaparin (LOVENOX) injection  40 mg Subcutaneous QHS  . fentaNYL  300 mcg Transdermal Q72H  . HYDROmorphone   Intravenous Q4H  . meloxicam  7.5 mg Oral Daily  . metoCLOPramide (REGLAN) injection  10 mg Intravenous TID AC & HS  . sodium chloride flush  10-40 mL Intracatheter Q12H  . tamoxifen  20 mg Oral Daily   Continuous Infusions:    LOS: 4 days   Time spent: 10 min  Velvet Bathe, MD [facility] Pager [phone]  If 7PM-7AM, please contact night-coverage [email or web address] Password [email or web address] 10/07/2017, 1:36 PM

## 2017-10-07 NOTE — Progress Notes (Signed)
   10/07/17 1435  Clinical Encounter Type  Visited With Patient  Visit Type Initial;Spiritual support  Referral From Nurse  Consult/Referral To Chaplain   Rounding on Palliative Patients.  Patient was in the bed covered up and stated she was in a lot of pain today and trying to rest.  Stated she does have family support from her husband.  She remained turned on her side kind of curled up with the lights off.  Will follow and support as needed. Chaplain Katherene Ponto

## 2017-10-07 NOTE — Progress Notes (Signed)
Palliative care progress note  Reason for consult: Pain management in light of metastatic disease  I met today with Kerry Morris.  She reports that pain was doing better yesterday but increased again this morning after dropping basal rate.    I reviewed her PCA and she again 66 mg of morphine last 24 hours.  This is equivalent to 1320 mg of oral morphine.   We did start fentanyl 372mg/hr last evening.  Basal rate was decreased at 3AM.    I spoke with her as well as her husband about working to discharge in order to catch a flight on Tuesday to get chemotherapy [place]  Reviewed plan with Dr. VWendee Beaversand will plan on continuing with PCA overnight and d/c early in the AM with [place].     -For pain, recommend continue fentanyl 3047m/hr.  Initial plan was to transition off of PCA today, but she has had an increase in her pain today.  I still believe that this is likely multifactorial including cancer progression, hydronephrosis, and constipation.   Will plan to continue PCA overnight and plan for d/c in the AM tomorrow as she is requesting discharge in order to catch [place] Tuesday [place] consult with them prior to any other changes to her care regimen.  - Constipation: Trial of relistor.  Would resume both Senna 2 tabs BID and miralax BID on discharge.  Total time: 40 minutes Greater than 50%  of this time was spent counseling and coordinating care related to the above assessment and plan.  GeMicheline RoughMD [facility] [phone]

## 2017-10-08 ENCOUNTER — Other Ambulatory Visit: Payer: Self-pay | Admitting: Hematology and Oncology

## 2017-10-08 ENCOUNTER — Ambulatory Visit: Payer: BLUE CROSS/BLUE SHIELD | Admitting: Hematology and Oncology

## 2017-10-08 MED ORDER — HYDROMORPHONE HCL 1 MG/ML IJ SOLN
1.0000 mg | Freq: Once | INTRAMUSCULAR | Status: AC
  Administered 2017-10-08: 1 mg via INTRAMUSCULAR
  Filled 2017-10-08: qty 1

## 2017-10-08 MED ORDER — HEPARIN SOD (PORK) LOCK FLUSH 100 UNIT/ML IV SOLN: 500.0000 [IU] | INTRAVENOUS | Status: DC | PRN

## 2017-10-08 MED ORDER — POLYETHYLENE GLYCOL 3350 17 G PO PACK: 17.0000 g | PACK | Freq: Every day | ORAL | 0 refills | Status: DC

## 2017-10-08 MED ORDER — SENNA 8.6 MG PO TABS: 2.0000 | ORAL_TABLET | Freq: Every day | ORAL | 0 refills | Status: DC

## 2017-10-08 MED ORDER — KETOROLAC TROMETHAMINE 30 MG/ML IJ SOLN
30.0000 mg | Freq: Once | INTRAMUSCULAR | Status: AC
  Administered 2017-10-08: 30 mg via INTRAMUSCULAR
  Filled 2017-10-08: qty 1

## 2017-10-08 NOTE — Discharge Summary (Signed)
Physician Discharge Summary  Kerry Morris RXV:[id number] DOB: 1965/08/12 DOA: 10/03/2017  PCP: Kerry Morris, No Pcp Per  Admit date: 10/03/2017 Discharge date: 10/08/2017  Time spent: 35 minutes  Recommendations for Outpatient Follow-up:   continue to adjust pain medication to optimize comfort   Discharge Diagnoses:  Active Problems:   Endometrial cancer ([place])   Other constipation   Cancer associated pain   Leukopenia due to antineoplastic chemotherapy ([place])   Bilateral hydronephrosis   Cancer related pain   Discharge Condition: stable  Diet recommendation: Heart healthy  Filed Weights   10/04/17 1743  Weight: 50.1 kg (110 lb 7.2 oz)    History of present illness:  52 y.o.femalewith medical history significant ofstage IV widely metastatic uterine cancer complicated by metastases to liver, bilateral hydronephrosis receiving was sent by [facility] [place] (last dose approximately 1 month), chronic pain comes in with acute on chronic pain and constipation    Hospital Course:  Acute on chronic pain - consulted palliative care who recommended the following: -For pain, recommend continue fentanyl 348mcg/hr.  Initial plan was to transition off of PCA today, but she has had an increase in her pain today.  I still believe that this is likely multifactorial including cancer progression, hydronephrosis, and constipation.   Will plan to continue PCA overnight and plan for d/c in the AM tomorrow as she is requesting discharge in order to catch flight on Tuesday to see her oncologist in [place] as she wants to consult with them prior to any other changes to her care regimen.  Per family and Kerry Morris request Kerry Morris will be discharged so she can follow up with her Oncologist at [facility] for further evaluation and recommendations  Constipation - adding senna and miralax on d/c   Procedures:  none  Consultations:  Palliative care  Discharge Exam: Vitals:   10/08/17  0528 10/08/17 0827  BP:    Pulse:    Resp: 13 12  Temp:    SpO2: 96% 94%    General: Pt in nad. Alert and awake Cardiovascular: rrr, no rubs Respiratory: no increased wob, no wheezes  Discharge Instructions   Discharge Instructions    Call MD for:  redness, tenderness, or signs of infection (pain, swelling, redness, odor or green/yellow discharge around incision site)   Complete by:  As directed    Call MD for:  temperature >100.4   Complete by:  As directed    Diet - low sodium heart healthy   Complete by:  As directed    Discharge instructions   Complete by:  As directed    Please follow up with your Pain specialist at the Cancer center in [place] per our discussion.   Increase activity slowly   Complete by:  As directed      Allergies as of 10/08/2017   No Known Allergies     Medication List    TAKE these medications   docusate sodium 100 MG capsule Commonly known as:  COLACE Take 100 mg by mouth daily as needed for mild constipation.   lidocaine-prilocaine cream Commonly known as:  EMLA Apply 1 application as needed topically.   meloxicam 7.5 MG tablet Commonly known as:  MOBIC Take 7.5 mg by mouth daily.   morphine 30 MG 12 hr tablet Commonly known as:  MS CONTIN Take 30 mg by mouth 2 (two) times daily.   ondansetron 4 MG disintegrating tablet Commonly known as:  ZOFRAN ODT Take 1 tablet (4 mg total) by mouth every  6 (six) hours as needed.   Oxycodone HCl 10 MG Tabs Take 10 mg by mouth every 4 (four) hours as needed (for pain).   polyethylene glycol packet Commonly known as:  MIRALAX Take 17 g by mouth daily.   prochlorperazine 10 MG tablet Commonly known as:  COMPAZINE Take 1 tablet (10 mg total) by mouth every 6 (six) hours as needed for nausea or vomiting.   senna 8.6 MG Tabs tablet Commonly known as:  SENOKOT Take 2 tablets (17.2 mg total) by mouth daily.   tamoxifen 20 MG tablet Commonly known as:  NOLVADEX Take 20 mg by mouth daily.       No Known Allergies    The results of significant diagnostics from this hospitalization (including imaging, microbiology, ancillary and laboratory) are listed below for reference.    Significant Diagnostic Studies: Ct Abdomen Pelvis W Contrast  Result Date: 09/28/2017 CLINICAL DATA:  52 year old female with history of lower abdominal pain and vomiting since yesterday. History of uterine cancer undergoing treatments. EXAM: CT ABDOMEN AND PELVIS WITH CONTRAST TECHNIQUE: Multidetector CT imaging of the abdomen and pelvis was performed using the standard protocol following bolus administration of intravenous contrast. CONTRAST:  163mL OMNIPAQUE IOHEXOL 300 MG/ML  SOLN COMPARISON:  CT the abdomen and pelvis 04/12/2017. FINDINGS: Lower chest: Unremarkable. Hepatobiliary: Well-defined 2.0 cm low-attenuation lesion in segment 4A of the liver (axial image 15 of series 3), similar to the prior study, compatible with a simple cyst. There are at least 2 new lesions in the right lobe of the liver concerning for metastatic lesions on images 11 and 14 of series 3. The largest of these is on image 11 measuring 17 x 11 mm and appears to be hypovascular in the periphery of segment 7. No intra or extrahepatic biliary ductal dilatation. Gallbladder is normal in appearance. Pancreas: Small calcification in the pancreatic head, likely sequela of prior pancreatitis. No definite pancreatic mass. No pancreatic ductal dilatation. No pancreatic or peripancreatic fluid or inflammatory changes. Spleen: Unremarkable. Adrenals/Urinary Tract: Worsening now severe right-sided hydroureteronephrosis, and new mild to moderate left-sided hydroureteronephrosis, both of which appears related to obstruction of the distal third of the ureters by increasing pelvic soft tissue (discussed below). Slight decreased nephrogram on the right as compared with the left, and lack of excretion of contrast material into the collecting system of the right  kidney on delayed images, indicative of compromised renal function in the setting of worsening obstruction. No suspicious renal lesions in either kidney. Bilateral adrenal glands are normal in appearance. Urinary bladder is unremarkable in appearance. Stomach/Bowel: Normal appearance of the stomach. No pathologic dilatation of small bowel or colon. Large volume of liquid stool throughout the colon. Relative narrowing of the sigmoid colon as it transitions through the pelvis, presumably related to mass effect and adhesions secondary to enlarging soft tissue masses throughout the low anatomic pelvis (discussed below). Normal appendix. Vascular/Lymphatic: Aortic atherosclerosis, without definite aneurysm or dissection in the abdominal or pelvic vasculature. Numerous prominent borderline enlarged and mildly enlarged retroperitoneal lymph nodes measuring up to 12 mm in short axis in the low left para-aortic nodal station (axial image 41 of series 3), similar to prior study from 04/12/2017. Abnormal enhancing soft tissue throughout the low anatomic pelvis, including some areas in the internal iliac distribution, some of which may represent lymphadenopathy (although this is difficult to distinguish from presumed peritoneal implants). Reproductive: Status post total abdominal hysterectomy and bilateral salpingo-oophorectomy. When compared to the prior examination, the previously noted peritoneal implant  in the left hemipelvis along the left pelvic side wall has significantly increased in size, currently measuring 3.7 x 2.8 cm (axial image 67 of series 3), as compared with 2.5 x 2.6 cm on prior study 04/12/2017. Previously noted right-sided implant in the low anatomic pelvis (axial image 66 of series 3) has also slightly increased in size, currently measuring 3.4 x 2.8 cm (previously 3.3 x 2.6 cm). Increasing soft tissue extending cephalad from the vaginal cuff, best appreciated on axial image 69 of series 3, currently  measuring 4.4 x 2.9 cm (previously far less notable and difficult to measure). Substantial increase in enhancing presacral soft tissue best appreciated on axial image 61 of series 3 currently measuring 5.3 x 3.0 cm (also previously much smaller and difficult to measure). Several other smaller peritoneal implants are noted throughout the low anatomic pelvis, some of which involve the small bowel mesentery. Other: No significant volume of ascites.  No pneumoperitoneum. Musculoskeletal: There are no aggressive appearing lytic or blastic lesions noted in the visualized portions of the skeleton. IMPRESSION: 1. Progressive metastatic disease with worsening peritoneal implants throughout the low anatomic pelvis and along the pelvic sidewall, in addition to stable retroperitoneal lymphadenopathy, as detailed above. This is now associated with worsening bilateral hydroureteronephrosis (right much greater than left), with decreased enhancement and lack of excretion of contrast material by the right kidney on today's examination. Urologic consultation for possible ureteral stent is suggested if clinically appropriate for palliation. 2. New hypovascular lesions in the right lobe of the liver, concerning for new metastatic lesions. Attention on followup studies is recommended. 3. Aortic atherosclerosis. 4. Additional incidental findings, as above. Electronically Signed   By: Vinnie Langton M.D.   On: 09/28/2017 08:07   Dg Abd Portable 2 Views  Result Date: 10/03/2017 CLINICAL DATA:  Pelvic pain.  Cancer Kerry Morris on chemotherapy. EXAM: PORTABLE ABDOMEN - 2 VIEW COMPARISON:  CT abdomen and pelvis dated 09/28/2017. FINDINGS: The bowel gas pattern is normal. There is no evidence of free air. No radio-opaque calculi or other significant radiographic abnormality is seen. Lung bases are clear. IMPRESSION: Negative. Please note that CT of 09/28/2017 showed progressive metastatic disease with worsening peritoneal implants, in  addition to retroperitoneal lymphadenopathy, with worsening bilateral hydroureteronephrosis. Electronically Signed   By: Franki Cabot M.D.   On: 10/03/2017 20:21    Microbiology: No results found for this or any previous visit (from the past 240 hour(s)).   Labs: Basic Metabolic Panel: Recent Labs  Lab 10/03/17 1927 10/04/17 0532  NA 132* 137  K 3.7 3.6  CL 93* 99*  CO2 28 29  GLUCOSE 130* 105*  BUN 14 12  CREATININE 0.75 0.77  CALCIUM 9.0 8.5*   Liver Function Tests: Recent Labs  Lab 10/03/17 1927  AST 35  ALT 24  ALKPHOS 61  BILITOT 0.2*  PROT 6.6  ALBUMIN 3.5   Recent Labs  Lab 10/03/17 1927  LIPASE 21   No results for input(s): AMMONIA in the last 168 hours. CBC: Recent Labs  Lab 10/03/17 1927 10/04/17 0532  WBC 1.8* 1.8*  NEUTROABS 0.3*  --   HGB 8.1* 7.4*  HCT 23.5* 22.2*  MCV 97.1 99.1  PLT 158 143*   Cardiac Enzymes: No results for input(s): CKTOTAL, CKMB, CKMBINDEX, TROPONINI in the last 168 hours. BNP: BNP (last 3 results) No results for input(s): BNP in the last 8760 hours.  ProBNP (last 3 results) No results for input(s): PROBNP in the last 8760 hours.  CBG: No  results for input(s): GLUCAP in the last 168 hours.     Signed:  Velvet Bathe MD.  [facility] 10/08/2017, 10:43 AM

## 2017-11-22 ENCOUNTER — Encounter: Payer: Self-pay | Admitting: Licensed Clinical Social Worker

## 2017-11-22 NOTE — Progress Notes (Signed)
UPDATE: GPC3 c.[id number] VUS was reclassified to Likely Benign on 10/26/2017.

## 2017-12-16 ENCOUNTER — Encounter (HOSPITAL_COMMUNITY): Payer: Self-pay

## 2017-12-16 ENCOUNTER — Other Ambulatory Visit: Payer: Self-pay

## 2017-12-16 ENCOUNTER — Emergency Department (HOSPITAL_COMMUNITY): Payer: BLUE CROSS/BLUE SHIELD

## 2017-12-16 ENCOUNTER — Emergency Department (HOSPITAL_COMMUNITY)
Admission: EM | Admit: 2017-12-16 | Discharge: 2017-12-16 | Disposition: A | Discharge status: Home / Self Care | Payer: BLUE CROSS/BLUE SHIELD | Attending: Emergency Medicine | Admitting: Emergency Medicine

## 2017-12-16 DIAGNOSIS — E860 Dehydration: Secondary | ICD-10-CM | POA: Diagnosis not present

## 2017-12-16 DIAGNOSIS — R197 Diarrhea, unspecified: Secondary | ICD-10-CM | POA: Insufficient documentation

## 2017-12-16 DIAGNOSIS — R410 Disorientation, unspecified: Secondary | ICD-10-CM | POA: Insufficient documentation

## 2017-12-16 DIAGNOSIS — Z9221 Personal history of antineoplastic chemotherapy: Secondary | ICD-10-CM | POA: Insufficient documentation

## 2017-12-16 DIAGNOSIS — Z8542 Personal history of malignant neoplasm of other parts of uterus: Secondary | ICD-10-CM | POA: Insufficient documentation

## 2017-12-16 DIAGNOSIS — R112 Nausea with vomiting, unspecified: Secondary | ICD-10-CM | POA: Insufficient documentation

## 2017-12-16 DIAGNOSIS — Z87891 Personal history of nicotine dependence: Secondary | ICD-10-CM | POA: Insufficient documentation

## 2017-12-16 DIAGNOSIS — R569 Unspecified convulsions: Secondary | ICD-10-CM | POA: Diagnosis present

## 2017-12-16 LAB — COMPREHENSIVE METABOLIC PANEL
ALK PHOS: 87 U/L (ref 38–126)
ALT: 21 U/L (ref 0–44)
AST: 40 U/L (ref 15–41)
Albumin: 4.2 g/dL (ref 3.5–5.0)
Anion gap: 15 (ref 5–15)
BILIRUBIN TOTAL: 0.6 mg/dL (ref 0.3–1.2)
BUN: 18 mg/dL (ref 6–20)
CALCIUM: 10.2 mg/dL (ref 8.9–10.3)
CHLORIDE: 94 mmol/L — AB (ref 98–111)
CO2: 26 mmol/L (ref 22–32)
CREATININE: 1.13 mg/dL — AB (ref 0.44–1.00)
GFR, EST NON AFRICAN AMERICAN: 55 mL/min — AB (ref >60)
Glucose, Bld: 103 mg/dL — ABNORMAL HIGH (ref 70–99)
Potassium: 4.6 mmol/L (ref 3.5–5.1)
Sodium: 135 mmol/L (ref 135–145)
Total Protein: 9.1 g/dL — ABNORMAL HIGH (ref 6.5–8.1)

## 2017-12-16 LAB — CBC
HEMATOCRIT: 46.8 % — AB (ref 36.0–46.0)
HEMOGLOBIN: 15.7 g/dL — AB (ref 12.0–15.0)
MCH: 31 pg (ref 26.0–34.0)
MCHC: 33.5 g/dL (ref 30.0–36.0)
MCV: 92.5 fL (ref 78.0–100.0)
Platelets: 187 10*3/uL (ref 150–400)
RBC: 5.06 MIL/uL (ref 3.87–5.11)
RDW: 13 % (ref 11.5–15.5)
WBC: 6.1 10*3/uL (ref 4.0–10.5)

## 2017-12-16 LAB — CBG MONITORING, ED: Glucose-Capillary: 100 mg/dL — ABNORMAL HIGH (ref 70–99)

## 2017-12-16 LAB — MAGNESIUM: MAGNESIUM: 2.6 mg/dL — AB (ref 1.7–2.4)

## 2017-12-16 MED ORDER — SODIUM CHLORIDE 0.9 % IV BOLUS
1000.0000 mL | Freq: Once | INTRAVENOUS | Status: AC
  Administered 2017-12-16: 1000 mL via INTRAVENOUS

## 2017-12-16 MED ORDER — DICYCLOMINE HCL 10 MG PO CAPS
10.0000 mg | ORAL_CAPSULE | Freq: Once | ORAL | Status: AC
  Administered 2017-12-16: 10 mg via ORAL
  Filled 2017-12-16: qty 1

## 2017-12-16 MED ORDER — MAGNESIUM SULFATE 2 GM/50ML IV SOLN
2.0000 g | Freq: Once | INTRAVENOUS | Status: AC
  Administered 2017-12-16: 2 g via INTRAVENOUS
  Filled 2017-12-16: qty 50

## 2017-12-16 MED ORDER — HEPARIN SOD (PORK) LOCK FLUSH 100 UNIT/ML IV SOLN
500.0000 [IU] | INTRAVENOUS | Status: AC | PRN
  Administered 2017-12-16: 500 [IU]

## 2017-12-16 MED ORDER — SODIUM CHLORIDE 0.9% FLUSH
10.0000 mL | INTRAVENOUS | Status: DC | PRN
Start: 2017-12-16 — End: 2017-12-16
  Administered 2017-12-16: 10 mL
  Filled 2017-12-16: qty 40

## 2017-12-16 MED ORDER — METHADONE HCL 5 MG PO TABS
5.0000 mg | ORAL_TABLET | Freq: Once | ORAL | Status: AC
Start: 2017-12-16 — End: 2017-12-16
  Administered 2017-12-16: 5 mg via ORAL
  Filled 2017-12-16: qty 1

## 2017-12-16 MED ORDER — OXYCODONE HCL 5 MG PO TABS
10.0000 mg | ORAL_TABLET | Freq: Once | ORAL | Status: AC
  Administered 2017-12-16: 10 mg via ORAL
  Filled 2017-12-16: qty 2

## 2017-12-16 NOTE — Discharge Instructions (Signed)
Follow-up at [facility] [place] as planned about dosage of her chemotherapy agents.  Also about the potential for her going to the palliative care center.  Return if she has recurrent seizure, high fever or other concerns.

## 2017-12-16 NOTE — Progress Notes (Addendum)
2:09pm- CSW received call from pt's husband Kirtland Bouchard and was informed that there are no facilities that will take only palliative care pt-must be hospice as well. Kirtland Bouchard expressed that facilities suggested that Kirtland Bouchard speak with CSW about SNF placement. CSW advised Kirtland Bouchard that pt must have a skillable need (not begin able to walk, talk, feed self,ect) in order for facility to consider pt. Kirtland Bouchard expressed that pt is able to do all of these task. CSW advised that if pt goes to a facility pt would more than likely have to pay out of pocket as [employer] most likely will not cover expense. CSW offered placement with out of pocket cost and Kirtland Bouchard expressed that he would like to speak with doctors from [facility] in [place] to see what else they can do to help pt.  CSW consulted by MD CSW spoke with pt's husband sister at bedside. CSW was informed that pt is currently getting treatment at [facility] in [place]. Husband presented to questions to ED regarding getting pt placed into a Palliative Care facility. CSW explained that usually in order to get into hospice and palliative care pt would need a 2 week-6 month prognosis. Pt's husband expressed knowing this but spoke about a facility that takes pt's for only palliative care. Husband expressed that he would be going home to get information on facilities as well as reach out to them as he is aware that in order for pt to go to these facilities he would need an MD to authorize it. Husband expressed that he is planning to have the MD's at [facility] authorize this. CSW provided husband with card to reach out to if more guidance or support is needed at this time. No further CSW needs. CSW signing off.  Virgie Dad Saron Tweed, MSW, [facility] Clinical Social Worker [phone]

## 2017-12-16 NOTE — ED Notes (Signed)
Patient transported to CT 

## 2017-12-16 NOTE — ED Notes (Signed)
IV team at bedside 

## 2017-12-16 NOTE — ED Notes (Signed)
Pt again out of bed to bedside commode. Pigtail attached to port noted as missing cap. Will contact iv team to re access in sterile facshion.

## 2017-12-16 NOTE — ED Notes (Signed)
Pt oob to bsc with assist. Return s to bed withput producing stool.

## 2017-12-16 NOTE — ED Provider Notes (Signed)
[place] [facility] EMERGENCY DEPARTMENT Provider Note   CSN: [id number] Arrival date & time: 12/16/17  6712     History   Chief Complaint Chief Complaint  Patient presents with  . Seizures  . Abdominal Pain    HPI Kerry Morris is a 52 y.o. female.  Patient is a 52 year old female with stage IV endometrial cancer currently on chemotherapy presenting today with a first-time seizure.  Has been woke up and she was shaking rhythmically in all extremities and not responding.  After this resolved she was confused and could not remember her name but quickly improved.  Husband states that 3 days ago she had been constipated and they recommended she try magnesium citrate.  She took that and vomited and had diarrhea all day on Saturday and also Sunday.  Husband states she was refusing to eat or drink anything yesterday.  She is currently still using her pain patch and pain medicine as needed.  Also of note she had stents placed in her ureters on Wednesday due to cancer causing compression.  The history is provided by the spouse and the patient.  Seizures   This is a new problem. The current episode started less than 1 hour ago. The problem has been resolved. There was 1 seizure. The most recent episode lasted 30 to 120 seconds. Associated symptoms include nausea, vomiting and diarrhea. Pertinent negatives include no confusion, no headaches and no cough. Characteristics include rhythmic jerking and loss of consciousness. Characteristics do not include bit tongue. The episode was witnessed. There was no sensation of an aura present. The seizures did not continue in the ED. The seizure(s) had no focality. Possible causes include recent illness. There has been no fever.  Abdominal Pain   Associated symptoms include diarrhea, nausea and vomiting. Pertinent negatives include headaches.    Past Medical History:  Diagnosis Date  . Anxiety   . Anxiety and depression   . Cancer [facility])    uterine cancer  . Depression   . Genetic testing 01/10/2017   Ms. Goudeau underwent genetic counseling and testing for hereditary cancer syndromes on 12/26/2016. Her results were negative for mutations in all 83 genes analyzed by [facility]'s 83-gene Multi-Cancers Panel. Genes analyzed include: ALK, APC, ATM, AXIN2, BAP1, BARD1, BLM, BMPR1A, BRCA1, BRCA2, BRIP1, CASR, CDC73, CDH1, CDK4, CDKN1B, CDKN1C, CDKN2A, CEBPA, CHEK2, CTNNA1, DICER1, DIS3L2, EGFR, EPCAM, F    Patient Active Problem List   Diagnosis Date Noted  . Cancer related pain 10/04/2017  . Bilateral hydronephrosis 10/01/2017  . Oral thrush 06/24/2017  . Abdominal pain 03/11/2017  . Goals of care, counseling/discussion 02/22/2017  . Arteriosclerosis 01/31/2017  . Genetic testing 01/10/2017  . Leukopenia due to antineoplastic chemotherapy ([place]) 12/20/2016  . Cancer associated pain 11/21/2016  . Weight loss 11/08/2016  . Other constipation 11/08/2016  . Endometrial cancer ([place]) 11/07/2016    Past Surgical History:  Procedure Laterality Date  . ABDOMINAL HYSTERECTOMY    . CERVICAL BIOPSY  W/ LOOP ELECTRODE EXCISION    . DENTAL SURGERY    . IR FLUORO GUIDE PORT INSERTION RIGHT  04/23/2017  . IR US GUIDE VASC ACCESS RIGHT  04/23/2017     OB History    Gravida  0   Para  0   Term  0   Preterm  0   AB  0   Living  0     SAB  0   TAB  0   Ectopic  0   Multiple  0  Live Births               Home Medications    Prior to Admission medications   Medication Sig Start Date End Date Taking? Authorizing Provider  docusate sodium (COLACE) 100 MG capsule Take 100 mg by mouth daily as needed for mild constipation.    [provider]  lidocaine-prilocaine (EMLA) cream Apply 1 application as needed topically. Patient not taking: Reported on 09/28/2017 04/15/17   Heath Lark, MD  meloxicam (MOBIC) 7.5 MG tablet Take 7.5 mg by mouth daily. 09/11/17   [provider]  morphine (MS CONTIN) 30 MG 12 hr  tablet Take 30 mg by mouth 2 (two) times daily. 09/05/17   [provider]  ondansetron (ZOFRAN ODT) 4 MG disintegrating tablet Take 1 tablet (4 mg total) by mouth every 6 (six) hours as needed. 09/28/17   Ward, Delice Bison, DO  Oxycodone HCl 10 MG TABS Take 10 mg by mouth every 4 (four) hours as needed (for pain).  08/08/17   [provider]  polyethylene glycol (MIRALAX) packet Take 17 g by mouth daily. 10/08/17   Velvet Bathe, MD  prochlorperazine (COMPAZINE) 10 MG tablet Take 1 tablet (10 mg total) by mouth every 6 (six) hours as needed for nausea or vomiting. 06/24/17   Heath Lark, MD  senna (SENOKOT) 8.6 MG TABS tablet Take 2 tablets (17.2 mg total) by mouth daily. 10/08/17   Velvet Bathe, MD  tamoxifen (NOLVADEX) 20 MG tablet Take 20 mg by mouth daily. 09/09/17   [provider]    Family History Family History  Problem Relation Age of Onset  . Leukemia Mother 38       in remission  . Throat cancer Maternal Aunt        d.>50  . Alcohol abuse Maternal Uncle        d.>50    Social History Social History   Tobacco Use  . Smoking status: Former Smoker    Packs/day: 1.00    Years: 25.00    Pack years: 25.00    Types: Cigarettes    Last attempt to quit: 03/28/2015    Years since quitting: 2.7  . Smokeless tobacco: Never Used  Substance Use Topics  . Alcohol use: No    Alcohol/week: 0.0 oz    Comment: Previous h/o alcoholism, sober x 8 years  . Drug use: Not Currently    Types: Marijuana    Comment: remote h/o IVDU, has been worked up for parenterally transmitted infections multiple times since     Allergies   Patient has no known allergies.   Review of Systems Review of Systems  Respiratory: Negative for cough.   Gastrointestinal: Positive for abdominal pain, diarrhea, nausea and vomiting.  Neurological: Positive for seizures and loss of consciousness. Negative for headaches.  Psychiatric/Behavioral: Negative for confusion.  All other systems  reviewed and are negative.    Physical Exam Updated Vital Signs BP (!) 122/106 (BP Location: Right Arm)   Pulse (!) 120   Temp 97.9 F (36.6 C) (Oral)   Resp 20   Ht _0  (1.651 m)   Wt 45.4 kg (100 lb)   SpO2 97%   BMI 16.64 kg/m   Physical Exam  Constitutional: She is oriented to person, place, and time. She appears well-developed and well-nourished. She appears cachectic. No distress.  HENT:  Head: Normocephalic and atraumatic.  Mouth/Throat: Oropharynx is clear and moist. Mucous membranes are dry.  White plaques present over the  tongue consistent with Candida.  No bite marks to the tongue.  Eyes: Pupils are equal, round, and reactive to light. Conjunctivae and EOM are normal.  Neck: Normal range of motion. Neck supple.  Cardiovascular: Regular rhythm and intact distal pulses. Tachycardia present.  No murmur heard. Pulmonary/Chest: Effort normal and breath sounds normal. No respiratory distress. She has no wheezes. She has no rales.  Abdominal: Soft. She exhibits no distension. There is no tenderness. There is no rebound and no guarding.  Musculoskeletal: Normal range of motion. She exhibits no edema or tenderness.  Neurological: She is alert and oriented to person, place, and time. She has normal strength. No cranial nerve deficit or sensory deficit.  Skin: Skin is warm and dry. No rash noted. No erythema.  Psychiatric: She has a normal mood and affect. Her behavior is normal.  Nursing note and vitals reviewed.    ED Treatments / Results  Labs (all labs ordered are listed, but only abnormal results are displayed) Labs Reviewed  CBC - Abnormal; Notable for the following components:      Result Value   Hemoglobin 15.7 (*)    HCT 46.8 (*)    All other components within normal limits  COMPREHENSIVE METABOLIC PANEL - Abnormal; Notable for the following components:   Chloride 94 (*)    Glucose, Bld 103 (*)    Creatinine, Ser 1.13 (*)    Total Protein 9.1 (*)    GFR  calc non Af Amer 55 (*)    All other components within normal limits  MAGNESIUM - Abnormal; Notable for the following components:   Magnesium 2.6 (*)    All other components within normal limits  CBG MONITORING, ED - Abnormal; Notable for the following components:   Glucose-Capillary 100 (*)    All other components within normal limits    EKG EKG Interpretation  Date/Time:  Monday December 16 2017 09:10:47 EDT Ventricular Rate:  127 PR Interval:    QRS Duration: 90 QT Interval:  334 QTC Calculation: 486 R Axis:   65 Text Interpretation:  Sinus tachycardia Left ventricular hypertrophy Borderline prolonged QT interval No significant change since last tracing Confirmed by Blanchie Dessert [phone]) on 12/16/2017 9:33:57 AM   Radiology Ct Head Wo Contrast  Result Date: 12/16/2017 CLINICAL DATA:  Seizure.  History of metastatic endometrial cancer EXAM: CT HEAD WITHOUT CONTRAST TECHNIQUE: Contiguous axial images were obtained from the base of the skull through the vertex without intravenous contrast. COMPARISON:  None. FINDINGS: Brain: No evidence of acute infarction, hemorrhage, hydrocephalus, extra-axial collection or mass lesion/mass effect. Vascular: Negative for hyperdense vessel Skull: Negative Sinuses/Orbits: Negative Other: None IMPRESSION: Negative CT head Electronically Signed   By: Franchot Gallo M.D.   On: 12/16/2017 11:19   Dg Abd Portable 2 Views  Result Date: 12/16/2017 CLINICAL DATA:  Abdominal pain EXAM: PORTABLE ABDOMEN - 2 VIEW COMPARISON:  10/03/2017 FINDINGS: Scattered large and small bowel gas is noted. Mild retained fecal material is seen without obstructive change. Bilateral ureteral stents are noted in satisfactory position. No bony abnormality is seen. No free air is seen. IMPRESSION: Mild retained fecal material.  No other is noted. Electronically Signed   By: Inez Catalina M.D.   On: 12/16/2017 16:12    Procedures Procedures (including critical care  time)  Medications Ordered in ED Medications  sodium chloride 0.9 % bolus 1,000 mL (has no administration in time range)     Initial Impression / Assessment and Plan / ED Course  I  have reviewed the triage vital signs and the nursing notes.  Pertinent labs & imaging results that were available during my care of the patient were reviewed by me and considered in my medical decision making (see chart for details).     Patient with stage IV endometrial cancer presenting today with first-time seizure.  Patient has had excessive diarrhea and vomiting since Saturday after taking magnesium citrate for constipation.  Concern for possible hyponatremia or hypokalemia as the cause for her seizures versus given history of stage IV endometrial cancer possible metastasis to the brain.  Patient given IV fluids.  CBC, CMP, magnesium pending.  EKG just shows a sinus tachycardia.  CT of the brain pending.  1:11 PM Labs with mild AKI and hypomagnesemia.  Patient was replaced with IV fluids and magnesium.  CT is negative for any acute pathology.  Discussed this findings with the patient and mother.  They are currently working on getting her into a palliative care center for hydration and supportive care but they are taking care of that through their doctors with cancer [facility] [place].  Feel that patient is stable for discharge home.  Patient received IV fluids.  She was given a home dose of her pain medications.  Family was talking with the cancer center [facility] [place] and they wanted her her to have imaging to ensure she did not have obstruction.  Low suspicion that patient has obstruction but will do a KUB to confirm.  4:23 PM Abdominal series without evidence of obstruction.  Patient is tolerating p.o.'s.  Tachycardia has resolved after fluids.  Family is planning on following up with her primary doctor and also possibly decreasing her chemotherapy agent as it has recently been doubled and does have side effect  of seizure and hypertension.  Patient has been hypertensive throughout her stay here.  Do not feel that we need to start antihypertensives but they will follow with cancer centers of [place] whether she would need treatment if her blood pressure does not improve after decreasing her chemotherapy agent. Final Clinical Impressions(s) / ED Diagnoses   Final diagnoses:  Seizure [facility])  Dehydration  Hypomagnesemia    ED Discharge Orders    None       Blanchie Dessert, MD 12/16/17 1624

## 2017-12-16 NOTE — ED Notes (Signed)
Pt stata she understands instructions. Home stable with family.

## 2017-12-16 NOTE — ED Triage Notes (Signed)
Pt arrives EMS from home with c/o Seizure . Pt was in bed with husband about 830 when he was woken with pt in full seizure lasting approx 2 minutes. Pt has no hx of seizure but started on cancer med with seizure as possible side effect. Pt arrives and goes immediately to bathroom. C/o doarrhea and vomiting. Per family not eating much lately. Hx of uterine cancer.and started new regimen.

## 2017-12-16 NOTE — ED Notes (Signed)
Seizure pads placed

## 2017-12-16 NOTE — ED Notes (Signed)
Dr. Francia Greaves aware of bp.

## 2017-12-27 ENCOUNTER — Emergency Department (HOSPITAL_COMMUNITY): Payer: BLUE CROSS/BLUE SHIELD

## 2017-12-27 ENCOUNTER — Other Ambulatory Visit: Payer: Self-pay

## 2017-12-27 ENCOUNTER — Encounter (HOSPITAL_COMMUNITY): Payer: Self-pay

## 2017-12-27 ENCOUNTER — Inpatient Hospital Stay (HOSPITAL_COMMUNITY)
Admission: EM | Admit: 2017-12-27 | Discharge: 2017-12-31 | DRG: 300 | Disposition: A | Discharge status: Home / Self Care | Payer: BLUE CROSS/BLUE SHIELD | Attending: Internal Medicine | Admitting: Internal Medicine

## 2017-12-27 DIAGNOSIS — Z681 Body mass index (BMI) 19 or less, adult: Secondary | ICD-10-CM | POA: Diagnosis not present

## 2017-12-27 DIAGNOSIS — R1030 Lower abdominal pain, unspecified: Secondary | ICD-10-CM | POA: Diagnosis not present

## 2017-12-27 DIAGNOSIS — D61818 Other pancytopenia: Secondary | ICD-10-CM | POA: Diagnosis present

## 2017-12-27 DIAGNOSIS — Z7189 Other specified counseling: Secondary | ICD-10-CM | POA: Diagnosis not present

## 2017-12-27 DIAGNOSIS — Z90710 Acquired absence of both cervix and uterus: Secondary | ICD-10-CM

## 2017-12-27 DIAGNOSIS — G893 Neoplasm related pain (acute) (chronic): Secondary | ICD-10-CM | POA: Diagnosis present

## 2017-12-27 DIAGNOSIS — C541 Malignant neoplasm of endometrium: Secondary | ICD-10-CM | POA: Diagnosis present

## 2017-12-27 DIAGNOSIS — F329 Major depressive disorder, single episode, unspecified: Secondary | ICD-10-CM | POA: Diagnosis present

## 2017-12-27 DIAGNOSIS — I871 Compression of vein: Principal | ICD-10-CM | POA: Diagnosis present

## 2017-12-27 DIAGNOSIS — K5903 Drug induced constipation: Secondary | ICD-10-CM

## 2017-12-27 DIAGNOSIS — R1084 Generalized abdominal pain: Secondary | ICD-10-CM

## 2017-12-27 DIAGNOSIS — Z515 Encounter for palliative care: Secondary | ICD-10-CM | POA: Diagnosis not present

## 2017-12-27 DIAGNOSIS — Z806 Family history of leukemia: Secondary | ICD-10-CM | POA: Diagnosis not present

## 2017-12-27 DIAGNOSIS — C787 Secondary malignant neoplasm of liver and intrahepatic bile duct: Secondary | ICD-10-CM | POA: Diagnosis present

## 2017-12-27 DIAGNOSIS — C7989 Secondary malignant neoplasm of other specified sites: Secondary | ICD-10-CM | POA: Diagnosis present

## 2017-12-27 DIAGNOSIS — Z87891 Personal history of nicotine dependence: Secondary | ICD-10-CM | POA: Diagnosis not present

## 2017-12-27 DIAGNOSIS — Z808 Family history of malignant neoplasm of other organs or systems: Secondary | ICD-10-CM | POA: Diagnosis not present

## 2017-12-27 DIAGNOSIS — R64 Cachexia: Secondary | ICD-10-CM | POA: Diagnosis present

## 2017-12-27 DIAGNOSIS — R600 Localized edema: Secondary | ICD-10-CM | POA: Diagnosis present

## 2017-12-27 DIAGNOSIS — R220 Localized swelling, mass and lump, head: Secondary | ICD-10-CM

## 2017-12-27 DIAGNOSIS — R32 Unspecified urinary incontinence: Secondary | ICD-10-CM | POA: Diagnosis present

## 2017-12-27 DIAGNOSIS — N1330 Unspecified hydronephrosis: Secondary | ICD-10-CM | POA: Diagnosis present

## 2017-12-27 DIAGNOSIS — F411 Generalized anxiety disorder: Secondary | ICD-10-CM

## 2017-12-27 DIAGNOSIS — K5909 Other constipation: Secondary | ICD-10-CM

## 2017-12-27 DIAGNOSIS — Z888 Allergy status to other drugs, medicaments and biological substances status: Secondary | ICD-10-CM | POA: Diagnosis not present

## 2017-12-27 DIAGNOSIS — R109 Unspecified abdominal pain: Secondary | ICD-10-CM | POA: Diagnosis present

## 2017-12-27 LAB — COMPREHENSIVE METABOLIC PANEL
ALK PHOS: 85 U/L (ref 38–126)
ALT: 17 U/L (ref 0–44)
AST: 32 U/L (ref 15–41)
Albumin: 3.2 g/dL — ABNORMAL LOW (ref 3.5–5.0)
Anion gap: 12 (ref 5–15)
BUN: 9 mg/dL (ref 6–20)
CALCIUM: 8.7 mg/dL — AB (ref 8.9–10.3)
CO2: 24 mmol/L (ref 22–32)
CREATININE: 0.79 mg/dL (ref 0.44–1.00)
Chloride: 98 mmol/L (ref 98–111)
Glucose, Bld: 94 mg/dL (ref 70–99)
Potassium: 3.7 mmol/L (ref 3.5–5.1)
Sodium: 134 mmol/L — ABNORMAL LOW (ref 135–145)
Total Bilirubin: 0.7 mg/dL (ref 0.3–1.2)
Total Protein: 6.8 g/dL (ref 6.5–8.1)

## 2017-12-27 LAB — URINALYSIS, ROUTINE W REFLEX MICROSCOPIC
Bacteria, UA: NONE SEEN
Bilirubin Urine: NEGATIVE
Glucose, UA: NEGATIVE mg/dL
Ketones, ur: NEGATIVE mg/dL
Nitrite: NEGATIVE
Protein, ur: 30 mg/dL — AB
SPECIFIC GRAVITY, URINE: 1.009 (ref 1.005–1.030)
pH: 7 (ref 5.0–8.0)

## 2017-12-27 LAB — CBC WITH DIFFERENTIAL/PLATELET
ABS IMMATURE GRANULOCYTES: 0 10*3/uL (ref 0.0–0.1)
Basophils Absolute: 0 10*3/uL (ref 0.0–0.1)
Basophils Relative: 0 %
Eosinophils Absolute: 0.1 10*3/uL (ref 0.0–0.7)
Eosinophils Relative: 1 %
HEMATOCRIT: 36.8 % (ref 36.0–46.0)
HEMOGLOBIN: 11.8 g/dL — AB (ref 12.0–15.0)
Immature Granulocytes: 0 %
LYMPHS ABS: 0.8 10*3/uL (ref 0.7–4.0)
LYMPHS PCT: 16 %
MCH: 30.6 pg (ref 26.0–34.0)
MCHC: 32.1 g/dL (ref 30.0–36.0)
MCV: 95.6 fL (ref 78.0–100.0)
MONO ABS: 0.1 10*3/uL (ref 0.1–1.0)
MONOS PCT: 1 %
NEUTROS ABS: 4.3 10*3/uL (ref 1.7–7.7)
Neutrophils Relative %: 82 %
Platelets: 118 10*3/uL — ABNORMAL LOW (ref 150–400)
RBC: 3.85 MIL/uL — ABNORMAL LOW (ref 3.87–5.11)
RDW: 14.6 % (ref 11.5–15.5)
WBC: 5.3 10*3/uL (ref 4.0–10.5)

## 2017-12-27 LAB — LIPASE, BLOOD: Lipase: 30 U/L (ref 11–51)

## 2017-12-27 MED ORDER — FENTANYL 25 MCG/HR TD PT72
25.0000 ug | MEDICATED_PATCH | TRANSDERMAL | Status: DC
  Filled 2017-12-27: qty 1

## 2017-12-27 MED ORDER — MORPHINE SULFATE (PF) 4 MG/ML IV SOLN
4.0000 mg | Freq: Once | INTRAVENOUS | Status: AC
  Administered 2017-12-27: 4 mg via INTRAVENOUS
  Filled 2017-12-27: qty 1

## 2017-12-27 MED ORDER — METHYLPREDNISOLONE SODIUM SUCC 125 MG IJ SOLR
125.0000 mg | Freq: Once | INTRAMUSCULAR | Status: AC
  Administered 2017-12-27: 125 mg via INTRAVENOUS
  Filled 2017-12-27: qty 2

## 2017-12-27 MED ORDER — ONDANSETRON HCL 4 MG/2ML IJ SOLN: 4.0000 mg | Freq: Four times a day (QID) | INTRAMUSCULAR | Status: DC | PRN

## 2017-12-27 MED ORDER — DIPHENHYDRAMINE HCL 50 MG/ML IJ SOLN
25.0000 mg | Freq: Once | INTRAMUSCULAR | Status: AC
  Administered 2017-12-27: 25 mg via INTRAVENOUS
  Filled 2017-12-27: qty 1

## 2017-12-27 MED ORDER — IOHEXOL 300 MG/ML  SOLN
100.0000 mL | Freq: Once | INTRAMUSCULAR | Status: AC | PRN
  Administered 2017-12-27: 100 mL via INTRAVENOUS

## 2017-12-27 MED ORDER — HEPARIN (PORCINE) IN NACL 100-0.45 UNIT/ML-% IJ SOLN
850.0000 [IU]/h | INTRAMUSCULAR | Status: AC
  Administered 2017-12-27: 750 [IU]/h via INTRAVENOUS
  Filled 2017-12-27: qty 250

## 2017-12-27 MED ORDER — ONDANSETRON HCL 4 MG PO TABS
4.0000 mg | ORAL_TABLET | Freq: Four times a day (QID) | ORAL | Status: DC | PRN
Start: 2017-12-27 — End: 2017-12-31

## 2017-12-27 MED ORDER — SODIUM CHLORIDE 0.9% FLUSH
3.0000 mL | INTRAVENOUS | Status: DC | PRN
  Administered 2017-12-31: 10 mL via INTRAVENOUS
  Filled 2017-12-27: qty 3

## 2017-12-27 MED ORDER — ACETAMINOPHEN 325 MG PO TABS: 650.0000 mg | ORAL_TABLET | Freq: Four times a day (QID) | ORAL | Status: DC | PRN

## 2017-12-27 MED ORDER — ACETAMINOPHEN 650 MG RE SUPP: 650.0000 mg | Freq: Four times a day (QID) | RECTAL | Status: DC | PRN

## 2017-12-27 MED ORDER — OXYCODONE HCL 5 MG PO TABS
10.0000 mg | ORAL_TABLET | ORAL | Status: DC | PRN
  Administered 2017-12-27 – 2017-12-29 (×7): 10 mg via ORAL
  Filled 2017-12-27 (×7): qty 2

## 2017-12-27 MED ORDER — ALPRAZOLAM 0.5 MG PO TABS
0.5000 mg | ORAL_TABLET | Freq: Every evening | ORAL | Status: DC | PRN
Start: 2017-12-27 — End: 2017-12-31
  Administered 2017-12-28: 0.5 mg via ORAL
  Filled 2017-12-27: qty 1

## 2017-12-27 MED ORDER — SODIUM CHLORIDE 0.9% FLUSH
3.0000 mL | Freq: Two times a day (BID) | INTRAVENOUS | Status: DC
  Administered 2017-12-27 – 2017-12-31 (×8): 3 mL via INTRAVENOUS

## 2017-12-27 MED ORDER — MORPHINE SULFATE (PF) 4 MG/ML IV SOLN: 4.0000 mg | INTRAVENOUS | Status: DC | PRN

## 2017-12-27 MED ORDER — HEPARIN BOLUS VIA INFUSION
2000.0000 [IU] | Freq: Once | INTRAVENOUS | Status: AC
Start: 2017-12-27 — End: 2017-12-27
  Administered 2017-12-27: 2000 [IU] via INTRAVENOUS
  Filled 2017-12-27: qty 2000

## 2017-12-27 MED ORDER — ONDANSETRON HCL 4 MG/2ML IJ SOLN
4.0000 mg | Freq: Once | INTRAMUSCULAR | Status: AC
  Administered 2017-12-27: 4 mg via INTRAVENOUS
  Filled 2017-12-27: qty 2

## 2017-12-27 MED ORDER — FAMOTIDINE IN NACL 20-0.9 MG/50ML-% IV SOLN
20.0000 mg | Freq: Once | INTRAVENOUS | Status: AC
Start: 2017-12-27 — End: 2017-12-27
  Administered 2017-12-27: 20 mg via INTRAVENOUS
  Filled 2017-12-27: qty 50

## 2017-12-27 MED ORDER — HYDROMORPHONE HCL 1 MG/ML IJ SOLN
1.0000 mg | Freq: Once | INTRAMUSCULAR | Status: AC
  Administered 2017-12-27: 1 mg via INTRAVENOUS
  Filled 2017-12-27: qty 1

## 2017-12-27 MED ORDER — SODIUM CHLORIDE 0.9 % IV BOLUS
1000.0000 mL | Freq: Once | INTRAVENOUS | Status: AC
  Administered 2017-12-27: 1000 mL via INTRAVENOUS

## 2017-12-27 MED ORDER — METHADONE HCL 5 MG PO TABS
5.0000 mg | ORAL_TABLET | Freq: Two times a day (BID) | ORAL | Status: DC
  Administered 2017-12-27 – 2017-12-31 (×8): 5 mg via ORAL
  Filled 2017-12-27 (×8): qty 1

## 2017-12-27 MED ORDER — BISACODYL 10 MG RE SUPP: 10.0000 mg | Freq: Every day | RECTAL | Status: DC | PRN

## 2017-12-27 MED ORDER — PANTOPRAZOLE SODIUM 40 MG PO TBEC
40.0000 mg | DELAYED_RELEASE_TABLET | Freq: Every day | ORAL | Status: DC
  Administered 2017-12-27 – 2017-12-31 (×5): 40 mg via ORAL
  Filled 2017-12-27 (×5): qty 1

## 2017-12-27 MED ORDER — SODIUM CHLORIDE 0.9 % IV SOLN: 250.0000 mL | INTRAVENOUS | Status: DC | PRN

## 2017-12-27 NOTE — Progress Notes (Signed)
ANTICOAGULATION CONSULT NOTE - Initial Consult  Pharmacy Consult for heparin Indication: VTE Treatment  Allergies  Allergen Reactions  . Lenvima [Lenvatinib] Other (See Comments)    seizure    Patient Measurements: Height: _0  (165.1 cm) Weight: 104 lb (47.2 kg) IBW/kg (Calculated) : 57 Heparin Dosing Weight: 47kg  Vital Signs: BP: 163/102 (08/02 2000) Pulse Rate: 99 (08/02 2000)  Labs: Recent Labs    12/27/17 1357  HGB 11.8*  HCT 36.8  PLT 118*  CREATININE 0.79    Estimated Creatinine Clearance: 61.3 mL/min (by C-G formula based on SCr of 0.79 mg/dL).   Medical History: Past Medical History:  Diagnosis Date  . Anxiety   . Anxiety and depression   . Cancer [facility])    uterine cancer  . Depression   . Genetic testing 01/10/2017   Ms. Peach underwent genetic counseling and testing for hereditary cancer syndromes on 12/26/2016. Her results were negative for mutations in all 83 genes analyzed by [facility]'s 83-gene Multi-Cancers Panel. Genes analyzed include: ALK, APC, ATM, AXIN2, BAP1, BARD1, BLM, BMPR1A, BRCA1, BRCA2, BRIP1, CASR, CDC73, CDH1, CDK4, CDKN1B, CDKN1C, CDKN2A, CEBPA, CHEK2, CTNNA1, DICER1, DIS3L2, EGFR, EPCAM, F     Assessment: 70 yoF with PMH cancer admitted with abdominal pain. 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 Pt on no OAC prior to admission, CBC wnl.  Goal of Therapy:  Heparin level 0.3-0.7 units/ml Monitor platelets by anticoagulation protocol: Yes   Plan:  -Heparin 2000 units x1 -Heparin 750 units/hr -Check 6-hr heparin level -Monitor heparin level, CBC, S/Sx bleeding daily  Arrie Senate, PharmD, BCPS Clinical Pharmacist [phone] Please check AMION for all [facility] Pharmacy numbers 12/27/2017

## 2017-12-27 NOTE — H&P (Signed)
Kerry Morris:[id number] DOB: 1965/12/01 DOA: 12/27/2017     PCP: System, Pcp Not In   Outpatient Specialists:     [place] Patient arrived to ER on 12/27/17 at 1202  Patient coming from:   home Lives   With family    Chief Complaint:  Chief Complaint  Patient presents with  . Allergic Reaction  . Abdominal Pain    HPI: Kerry Morris is a 52 y.o. 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  2 days ago she noticed some swelling in her neck yesterday she started to have significant swelling of her face and it started to turn purple today it became severe her face turned very purple with significant swelling where her eyes were almost completely shot family felt it was likely allergic reaction and wanted to bring her to emergency department.  Patient describes abdominal pain as cramping she has had trouble with passing stool no nausea and vomiting. Pr family on Tuesday she have had some problems with her [place] and "they did something to it" at [facility] [place] [facility] gave 50 mg of Benadryl last chemo was on Wednesday 2 days ago when she received a dose of carboplatin/paxitocel Reports urinary incontinence that is chronic denies any fevers or chills she has not been able to take her to rate her medications secondary to significant nausea and vomiting no associated chest pain or shortness of breath  At home she was supposed to be on methadone 5 mg twice a day and oxycodone 10 mg every 4 hours as needed as well as Duragesic patch 25 mcg  Was admitted for pain in May 2019 at that time she required PCA 300 mcg of fentanyl an hour.  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 CT ABDOMEN PELVIS W CONTRAST  . CT Chest W Contrast  . CBC with Differential/Platelet  . Comprehensive metabolic panel  . Lipase, blood  . Urinalysis, Routine w reflex microscopic  . Consult for [facility] Admission  . EKG 12-Lead      Following Medications were ordered in ER: Medications  famotidine (PEPCID) IVPB 20 mg premix (20 mg Intravenous New Bag/Given 12/27/17 1823)  sodium chloride 0.9 % bolus 1,000 mL (0 mLs Intravenous Stopped 12/27/17 1509)  ondansetron (ZOFRAN) injection 4 mg (4 mg Intravenous Given 12/27/17 1356)  morphine 4 MG/ML injection 4 mg (4 mg Intravenous Given 12/27/17 1356)  iohexol (OMNIPAQUE) 300 MG/ML solution 100 mL (100 mLs Intravenous Contrast Given 12/27/17 1610)  methylPREDNISolone sodium succinate (SOLU-MEDROL) 125 mg/2 mL injection 125 mg (125 mg Intravenous Given 12/27/17 1822)  diphenhydrAMINE (BENADRYL) injection 25 mg (25 mg Intravenous Given 12/27/17 1822)  HYDROmorphone (DILAUDID) injection 1 mg (1 mg Intravenous Given 12/27/17 1821)  Significant initial  Findings: Abnormal Labs Reviewed  CBC WITH DIFFERENTIAL/PLATELET - Abnormal; Notable for the following components:      Result Value   RBC 3.85 (*)    Hemoglobin 11.8 (*)    Platelets 118 (*)    All other components within normal limits  COMPREHENSIVE METABOLIC PANEL - Abnormal; Notable for the following components:   Sodium 134 (*)    Calcium 8.7 (*)     Albumin 3.2 (*)    All other components within normal limits  URINALYSIS, ROUTINE W REFLEX MICROSCOPIC - Abnormal; Notable for the following components:   Hgb urine dipstick MODERATE (*)    Protein, ur 30 (*)    Leukocytes, UA TRACE (*)    All other components within normal limits     Na 134 K 3.7  Cr   Stable,  Lab Results  Component Value Date   CREATININE 0.79 12/27/2017   CREATININE 1.13 (H) 12/16/2017   CREATININE 0.77 10/04/2017     PLt 118 WBC  5.3  HG/HCT Down   from baseline see below    Component Value Date/Time   HGB 11.8 (L) 12/27/2017 1357   HGB 13.2 05/20/2017 1444   HCT 36.8 12/27/2017 1357   HCT 38.3 05/20/2017 1444    Lactic Acid, Venous    Component Value Date/Time   LATICACIDVEN 1.86 10/03/2017 1938      UA  no evidence of UTI    CT chest  Showing evidence of SVC occlusion     CTabd/pelvis -the second progression of metastatic disease increase in hepatic metastatic lesions local invasion into bladder wall peritoneal implants with possible invasion into the bowel wall there is bilateral ureteral stents with persistent hydronephrosis  ECG:  Personally reviewed by me showing: HR : 117 Rhythm:  NSR   no evidence of ischemic changes QTC 425     ED Triage Vitals  Enc Vitals Group     BP 12/27/17 1215 (!) 152/95     Pulse Rate 12/27/17 1215 (!) 113     Resp 12/27/17 1215 13     Temp --      Temp src --      SpO2 12/27/17 1202 96 %     Weight 12/27/17 1207 104 lb (47.2 kg)     Height 12/27/17 1207 5' 5"  (1.651 m)     Head Circumference --      Peak Flow --      Pain Score 12/27/17 1207 10     Pain Loc --      Pain Edu? --      Excl. in [place]? --   TMAX(24)@       Latest  Blood pressure (!) 175/98, pulse (!) 106, resp. rate (!) 22, height 5' 5"  (1.651 m), weight 47.2 kg (104 lb), SpO2 98 %.    Hospitalist was called for admission for pain control for diffusely metastatic and stage endometrial cancer   Review of Systems:     Pertinent positives include:  fatigue, weight loss abdominal pain, nausea, vomiting  Constitutional:  No weight loss, night sweats, Fevers, chills, HEENT:  No headaches, Difficulty swallowing,Tooth/dental problems,Sore throat,  No sneezing, itching, ear ache, nasal congestion, post nasal drip,  Cardio-vascular:  No chest pain, Orthopnea, PND, anasarca, dizziness, palpitations.no Bilateral lower extremity swelling  GI:  No heartburn, indigestion, , diarrhea, change in bowel habits, loss of appetite, melena, blood in stool, hematemesis Resp:  no shortness of breath at rest. No dyspnea on exertion,  No excess mucus, no productive cough, No non-productive cough, No coughing up of blood.No change in color of mucus.No wheezing. Skin:  no rash or lesions. No jaundice GU:  no dysuria, change in color of urine, no urgency or frequency. No straining to urinate.  No flank pain.  Musculoskeletal:  No joint pain or no joint swelling. No decreased range of motion. No back pain.  Psych:  No change in mood or affect. No depression or anxiety. No memory loss.  Neuro: no localizing neurological complaints, no tingling, no weakness, no double vision, no gait abnormality, no slurred speech, no confusion  All systems reviewed and apart from [place] all are negative  Past Medical History:   Past Medical History:  Diagnosis Date  . Anxiety   . Anxiety and depression   . Cancer [facility])    uterine cancer  . Depression   . Genetic testing 01/10/2017   Ms. Lamons underwent genetic counseling and testing for hereditary cancer syndromes on 12/26/2016. Her results were negative for mutations in all 83 genes analyzed by [facility]'s 83-gene Multi-Cancers Panel. Genes analyzed include: ALK, APC, ATM, AXIN2, BAP1, BARD1, BLM, BMPR1A, BRCA1, BRCA2, BRIP1, CASR, CDC73, CDH1, CDK4, CDKN1B, CDKN1C, CDKN2A, CEBPA, CHEK2, CTNNA1, DICER1, DIS3L2, EGFR, EPCAM, F      Past Surgical History:  Procedure Laterality Date  .  ABDOMINAL HYSTERECTOMY    . CERVICAL BIOPSY  W/ LOOP ELECTRODE EXCISION    . DENTAL SURGERY    . IR FLUORO GUIDE PORT INSERTION RIGHT  04/23/2017  . IR US GUIDE VASC ACCESS RIGHT  04/23/2017    Social History:  Ambulatory  Independently    reports that she quit smoking about 2 years ago. Her smoking use included cigarettes. She has a 25.00 pack-year smoking history. She has never used smokeless tobacco. She reports that she has current or past drug history. Drug: Marijuana. She reports that she does not drink alcohol.     Family History:   Family History  Problem Relation Age of Onset  . Leukemia Mother 110       in remission  . Throat cancer Maternal Aunt        d.>50  . Alcohol abuse Maternal Uncle        d.>50    Allergies: Allergies  Allergen Reactions  . Lenvima [Lenvatinib] Other (See Comments)    seizure     Prior to Admission medications   Medication Sig Start Date End Date Taking? Authorizing Provider  acetaminophen (TYLENOL) 325 MG tablet Take 325 mg by mouth every 6 (six) hours as needed for mild pain.   Yes [provider]  ALPRAZolam Duanne Moron) 0.5 MG tablet Take 0.5 mg by mouth at bedtime as needed for anxiety or sleep.   Yes [provider]  celecoxib (CELEBREX) 200 MG capsule Take 200 mg by mouth daily as needed for moderate pain.   Yes [provider]  fentaNYL (DURAGESIC - DOSED MCG/HR) 25 MCG/HR patch Place 25 mcg onto the skin every 3 (three) days.   Yes [provider]  lubiprostone (AMITIZA) 24 MCG capsule Take 24 mcg by mouth 2 (two) times daily.    Yes [provider]  methadone (DOLOPHINE) 5 MG tablet Take 5 mg by mouth 2 (two) times daily.    Yes [provider]  ondansetron (ZOFRAN ODT) 4 MG disintegrating tablet Take 1 tablet (4 mg total) by mouth every 6 (six) hours as needed. Patient taking differently: Take 8 mg by mouth every 6 (  six) hours as needed.  09/28/17  Yes Ward, Kristen N, DO  OVER THE  COUNTER MEDICATION Inject 1 ampule as directed every other day. Viscum Abietis   Yes [provider]  Oxycodone HCl 10 MG TABS Take 10 mg by mouth every 4 (four) hours as needed (for pain).  08/08/17  Yes [provider]  pantoprazole (PROTONIX) 40 MG tablet Take 40 mg by mouth daily.   Yes [provider]  prochlorperazine (COMPAZINE) 10 MG tablet Take 1 tablet (10 mg total) by mouth every 6 (six) hours as needed for nausea or vomiting. 06/24/17  Yes Alvy Bimler, Ni, MD  senna (SENOKOT) 8.6 MG TABS tablet Take 2 tablets (17.2 mg total) by mouth daily. Patient taking differently: Take 4 tablets by mouth daily.  10/08/17  Yes Velvet Bathe, MD  lidocaine-prilocaine (EMLA) cream Apply 1 application as needed topically. Patient not taking: Reported on 09/28/2017 04/15/17   Heath Lark, MD  polyethylene glycol [facility]) packet Take 17 g by mouth daily. Patient not taking: Reported on 12/27/2017 10/08/17   Velvet Bathe, MD   Physical Exam: Blood pressure (!) 175/98, pulse (!) 106, resp. rate (!) 22, height 5' 5"  (1.651 m), weight 47.2 kg (104 lb), SpO2 98 %. 1. General:  in No Acute distress   Chronically ill   -appearing 2. Psychological: Alert and   Oriented 3. Head/ENT:     Dry Mucous Membranes                          Head Non traumatic, neck supple                            Poor Dentition                             Purple discoloration of the face with periorbital swelling 4. SKIN:  decreased Skin turgor,  Skin clean Dry and intact no rash 5. Heart: Regular rate and rhythm no  Murmur, no Rub or gallop 6. Lungs:   no wheezes or crackles   7. Abdomen: Soft,  tender,   distended  bowel sounds diminished 8. Lower extremities: no clubbing, cyanosis, or  edema 9. Neurologically Grossly intact, moving all 4 extremities equally   10. MSK: Normal range of motion   LABS:     Recent Labs  Lab 12/27/17 1357  WBC 5.3  NEUTROABS 4.3  HGB 11.8*  HCT 36.8  MCV 95.6  PLT 118*    Basic Metabolic Panel: Recent Labs  Lab 12/27/17 1357  NA 134*  K 3.7  CL 98  CO2 24  GLUCOSE 94  BUN 9  CREATININE 0.79  CALCIUM 8.7*     Recent Labs  Lab 12/27/17 1357  AST 32  ALT 17  ALKPHOS 85  BILITOT 0.7  PROT 6.8  ALBUMIN 3.2*   Recent Labs  Lab 12/27/17 1357  LIPASE 30   No results for input(s): AMMONIA in the last 168 hours.    HbA1C: No results for input(s): HGBA1C in the last 72 hours. CBG: No results for input(s): GLUCAP in the last 168 hours.    Urine analysis:    Component Value Date/Time   COLORURINE YELLOW 12/27/2017 [place] 12/27/2017 1357   LABSPEC 1.009 12/27/2017 1357   LABSPEC 1.005 03/12/2017 1011   PHURINE 7.0 12/27/2017 1357   GLUCOSEU  NEGATIVE 12/27/2017 1357   GLUCOSEU Negative 03/12/2017 1011   HGBUR MODERATE (A) 12/27/2017 1357   BILIRUBINUR NEGATIVE 12/27/2017 1357   BILIRUBINUR Negative 03/12/2017 [place] 12/27/2017 1357   PROTEINUR 30 (A) 12/27/2017 1357   UROBILINOGEN 0.2 03/12/2017 1011   NITRITE NEGATIVE 12/27/2017 1357   LEUKOCYTESUR TRACE (A) 12/27/2017 1357   LEUKOCYTESUR Trace 03/12/2017 1011     Cultures:    Component Value Date/Time   SDES ABSCESS RIGHT PELVIS TUBO OVARIAN 04/06/2015 1647   SDES ABSCESS LEFT PELVIS TUBO OVARIAN 04/06/2015 1647   SPECREQUEST NONE 04/06/2015 1647   SPECREQUEST NONE 04/06/2015 1647   CULT  04/06/2015 1647    FEW DIPHTHEROIDS(CORYNEBACTERIUM SPECIES) Note: Standardized susceptibility testing for this organism is not available. Performed at [place] [facility]  04/06/2015 1647    FEW DIPHTHEROIDS(CORYNEBACTERIUM SPECIES) Note: Standardized susceptibility testing for this organism is not available. Performed at [place] 04/10/2015 FINAL 04/06/2015 1647   REPTSTATUS 04/10/2015 FINAL 04/06/2015 1647     Radiological Exams on Admission: Ct Abdomen Pelvis W Contrast  Result Date: 12/27/2017 CLINICAL  DATA:  Abdominal pain, history of uterine carcinoma EXAM: CT ABDOMEN AND PELVIS WITH CONTRAST TECHNIQUE: Multidetector CT imaging of the abdomen and pelvis was performed using the standard protocol following bolus administration of intravenous contrast. CONTRAST:  157m OMNIPAQUE IOHEXOL 300 MG/ML  SOLN COMPARISON:  09/28/2017 FINDINGS: Lower chest: Lung bases are free of acute infiltrate or sizable effusion. Hepatobiliary: Liver demonstrates increase in both size and number of metastatic lesions. The dominant lesion lies within the midportion of the right lobe measuring approximately 3.6 cm in greatest dimension. This is an increase from 16 mm on the prior exam. Stable hepatic cyst is noted within the medial segment of the left lobe of the liver. Gallbladder is well distended. Some non dependent foci of air are noted likely related to small gallstones. No pericholecystic fluid is noted. Pancreas: Small calcification is again seen in the pancreatic head. Pancreas is otherwise within normal limits. Spleen: Normal in size without focal abnormality. Adrenals/Urinary Tract: Adrenal glands are within normal limits. Bilateral hydronephrotic changes are again identified and stable. Bilateral ureteral stents are seen in satisfactory position. The degree of dilatation is likely related to reflux from the bladder. Bladder is partially distended and demonstrates significant wall thickening along its right lateral aspect and posterior wall as well as superiorly. These changes would be consistent with metastatic invasion from the adjacent pelvic mass. Stomach/Bowel: Stomach is decompressed. No colonic obstructive changes are seen. The distal sigmoid/rectosigmoid region is surrounded by metastatic disease similar to that noted on the prior exam. No resultant obstructive changes are seen. Vascular/Lymphatic: Vascular calcifications are noted without aneurysmal dilatation. Considerable retroperitoneal lymphadenopathy is noted in  the periaortic, pericaval and intra-aortocaval region. Index lymph node in the periaortic region on image number 34 of series 3 now measures 14 mm in short axis and previously measured 11 mm in short axis. The intra-aortocaval node at a similar level now measures almost 16 mm in short axis. It previously measured 8 mm in short axis. New right inguinal adenopathy is noted measuring 13 mm in short axis. Reproductive: The uterus and ovaries have been removed consistent with the given clinical history. In the pelvis in the expected region of the uterus there are again peritoneal implants identified. Central decreased attenuation is noted consistent with a degree of central necrosis. The previously seen separate implants now appear to represent  1 confluent mass extending superiorly along the anterior aspect of the sacrum. There is also apparent local invasion into the bladder wall as previously described. The overall dimension of the pelvic mass measures approximately 7.5 cm in greatest transverse dimension increased from approximately 6.6 cm in greatest transverse dimension. Increased bulk is noted as well. Given the relative circumferential encasement of the rectosigmoid the possibility of local invasion into the bowel wall cannot be totally excluded. No perforation is identified at this time. Other: No significant ascites is seen.  No hernia is identified. Musculoskeletal: Bony structures show no lytic or sclerotic foci to suggest metastatic disease. No compression deformities are seen. IMPRESSION: Significant progression of metastatic disease. Increase in both size and number of hepatic metastatic lesions is seen. There is now identified local invasion involving the bladder wall particularly within the posterior and right bladder wall. The peritoneal implants seen previously now all represent 1 large confluent mass which has encircled the rectosigmoid. Localized invasion into the bowel wall deserves consideration.  There is also significant increase in retroperitoneal adenopathy. Bilateral ureteral stents with persistent hydronephrosis and hydroureter likely related to reflux from the bladder. Changes suggestive of gas containing gallstones. No pericholecystic fluid is identified. Electronically Signed   By: Inez Catalina M.D.   On: 12/27/2017 16:39    Chart has been reviewed    Assessment/Plan   52 y.o. 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 SVC syndrome -provider discussed case with Dr. Oneida Alar with vascular surgery who recommended at this point heparin IV, will see in consult in a.m. . Abdominal pain  likely secondary to severe constipation and progression of tumor burden would appreciate palliative care consult with help with management order Dulcolax suppositories . Bilateral hydronephrosis in the setting of tumor invasion patient at risk of renal failure we will continue to monitor kidney function . Cancer associated pain palliative care consult for symptom management continue fentanyl patch scheduled methadone oxycodone as needed with morphine IV for breakthrough . Endometrial cancer ([place]) getting care at [facility] [place] patient at this point still would be interested in continuing chemotherapy . Other constipation bowel regimen . Facial edema secondary to SVC syndrome supportive management and IV heparin for SVC syndrome to see if there is possibility of dissolving SVC clot    Other plan as per orders.  DVT prophylaxis: Heparin     Code Status:  FULL CODE as per patient   I had personally discussed CODE STATUS with patient and family  I had spent 24mn discussing goals of care and CODE STATUS  Family Communication:   Family   at   Bedside  plan of care was discussed with   Husband  Disposition Plan:     To home patient will greatly benefit from palliative care consult overall anticipated poor prognosis given severity of metastatic endometrial cancer                  Would benefit from PT/OT eval prior to DC  Ordered                                     Palliative care    consulted  Consults called: Palliative care vascular surgery  Admission status   inpatient      Level of care     stepdown     Sarabeth Benton 12/27/2017, 11:36 PM    [facility]  Pager [phone]   after 2 AM please page floor coverage PA If 7AM-7PM, please contact the day team taking care of the patient  [email or web address]  Password [email or web address]

## 2017-12-27 NOTE — ED Provider Notes (Signed)
[place] EMERGENCY DEPARTMENT Provider Note   CSN: [id number] Arrival date & time: 12/27/17  1202     History   Chief Complaint Chief Complaint  Patient presents with  . Allergic Reaction  . Abdominal Pain    HPI Kerry Morris is a 52 y.o. female.  HPI Vision with stage IV uterine cancer currently undergoing treatment at [facility] [place] presents with abdominal pain, nausea and vomiting and facial swelling that started yesterday.  States she has not had a bowel movement in some time.  She is incontinent of urine.  No definite fever or chills.  States she has not been able to take her medication for the last few days.  Denies any chest pain or difficulty breathing. Past Medical History:  Diagnosis Date  . Anxiety   . Anxiety and depression   . Cancer [facility])    uterine cancer  . Depression   . Genetic testing 01/10/2017   Ms. Brix underwent genetic counseling and testing for hereditary cancer syndromes on 12/26/2016. Her results were negative for mutations in all 83 genes analyzed by [facility]'s 83-gene Multi-Cancers Panel. Genes analyzed include: ALK, APC, ATM, AXIN2, BAP1, BARD1, BLM, BMPR1A, BRCA1, BRCA2, BRIP1, CASR, CDC73, CDH1, CDK4, CDKN1B, CDKN1C, CDKN2A, CEBPA, CHEK2, CTNNA1, DICER1, DIS3L2, EGFR, EPCAM, F    Patient Active Problem List   Diagnosis Date Noted  . Facial edema 12/27/2017  . 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 Cancer related pain 10/04/2017  . Bilateral hydronephrosis 10/01/2017  . Oral thrush 06/24/2017  . Abdominal pain 03/11/2017  . Goals of care, counseling/discussion 02/22/2017  . Arteriosclerosis 01/31/2017  . Genetic testing 01/10/2017  . Leukopenia due to antineoplastic chemotherapy ([place]) 12/20/2016  . Cancer associated pain 11/21/2016  . Weight loss 11/08/2016  . Other constipation 11/08/2016  . Endometrial cancer ([place]) 11/07/2016    Past Surgical History:  Procedure Laterality Date  . ABDOMINAL HYSTERECTOMY    .  CERVICAL BIOPSY  W/ LOOP ELECTRODE EXCISION    . DENTAL SURGERY    . IR FLUORO GUIDE PORT INSERTION RIGHT  04/23/2017  . IR US GUIDE VASC ACCESS RIGHT  04/23/2017     OB History    Gravida  0   Para  0   Term  0   Preterm  0   AB  0   Living  0     SAB  0   TAB  0   Ectopic  0   Multiple  0   Live Births               Home Medications    Prior to Admission medications   Medication Sig Start Date End Date Taking? Authorizing Provider  acetaminophen (TYLENOL) 325 MG tablet Take 325 mg by mouth every 6 (six) hours as needed for mild pain.   Yes [provider]  ALPRAZolam Duanne Moron) 0.5 MG tablet Take 0.5 mg by mouth at bedtime as needed for anxiety or sleep.   Yes [provider]  celecoxib (CELEBREX) 200 MG capsule Take 200 mg by mouth daily as needed for moderate pain.   Yes [provider]  fentaNYL (DURAGESIC - DOSED MCG/HR) 25 MCG/HR patch Place 25 mcg onto the skin every 3 (three) days.   Yes [provider]  lubiprostone (AMITIZA) 24 MCG capsule Take 24 mcg by mouth 2 (two) times daily.    Yes [provider]  methadone (DOLOPHINE) 5 MG tablet Take 5 mg by mouth 2 (two) times  daily.    Yes [provider]  ondansetron (ZOFRAN ODT) 4 MG disintegrating tablet Take 1 tablet (4 mg total) by mouth every 6 (six) hours as needed. Patient taking differently: Take 8 mg by mouth every 6 (six) hours as needed.  09/28/17  Yes Ward, Kristen N, DO  OVER THE COUNTER MEDICATION Inject 1 ampule as directed every other day. Viscum Abietis   Yes [provider]  Oxycodone HCl 10 MG TABS Take 10 mg by mouth every 4 (four) hours as needed (for pain).  08/08/17  Yes [provider]  pantoprazole (PROTONIX) 40 MG tablet Take 40 mg by mouth daily.   Yes [provider]  prochlorperazine (COMPAZINE) 10 MG tablet Take 1 tablet (10 mg total) by mouth every 6 (six) hours as needed for nausea or vomiting. 06/24/17   Yes Alvy Bimler, Ni, MD  senna (SENOKOT) 8.6 MG TABS tablet Take 2 tablets (17.2 mg total) by mouth daily. Patient taking differently: Take 4 tablets by mouth daily.  10/08/17  Yes Velvet Bathe, MD  lidocaine-prilocaine (EMLA) cream Apply 1 application as needed topically. Patient not taking: Reported on 09/28/2017 04/15/17   Heath Lark, MD  polyethylene glycol [facility]) packet Take 17 g by mouth daily. Patient not taking: Reported on 12/27/2017 10/08/17   Velvet Bathe, MD    Family History Family History  Problem Relation Age of Onset  . Leukemia Mother 55       in remission  . Throat cancer Maternal Aunt        d.>50  . Alcohol abuse Maternal Uncle        d.>50    Social History Social History   Tobacco Use  . Smoking status: Former Smoker    Packs/day: 1.00    Years: 25.00    Pack years: 25.00    Types: Cigarettes    Last attempt to quit: 03/28/2015    Years since quitting: 2.7  . Smokeless tobacco: Never Used  Substance Use Topics  . Alcohol use: No    Alcohol/week: 0.0 oz    Comment: Previous h/o alcoholism, sober x 8 years  . Drug use: Not Currently    Types: Marijuana    Comment: remote h/o IVDU, has been worked up for parenterally transmitted infections multiple times since     [place] [lenvatinib]   Review of Systems Review of Systems  Constitutional: Positive for fatigue. Negative for chills and fever.  HENT: Positive for facial swelling. Negative for congestion, sore throat and trouble swallowing.   Eyes: Negative for visual disturbance.  Respiratory: Negative for cough and shortness of breath.   Cardiovascular: Negative for chest pain.  Gastrointestinal: Positive for abdominal pain, constipation, nausea and vomiting. Negative for diarrhea.  Genitourinary: Positive for pelvic pain. Negative for dysuria, flank pain, frequency and hematuria.  Musculoskeletal: Negative for back pain, myalgias and neck pain.  Skin: Negative for rash and wound.    Neurological: Positive for weakness. Negative for light-headedness, numbness and headaches.  Psychiatric/Behavioral: The patient is nervous/anxious.   All other systems reviewed and are negative.    Physical Exam Updated Vital Signs BP (!) 163/102   Pulse 99   Resp 16   Ht 5' 5"  (1.651 m)   Wt 47.2 kg (104 lb)   SpO2 96%   BMI 17.31 kg/m   Physical Exam  Constitutional: She is oriented to person, place, and time. She appears well-developed and well-nourished.  Chronically ill-appearing  HENT:  Head: Normocephalic and atraumatic.  Mouth/Throat: Oropharynx is clear and moist.  Mild periorbital edema.  Congested discoloration of the face and neck.  Eyes: Pupils are equal, round, and reactive to light. EOM are normal.  Neck: Normal range of motion. Neck supple.  No meningismus  Cardiovascular: Regular rhythm.  Tachycardia  Pulmonary/Chest: Effort normal and breath sounds normal. No stridor. She has no wheezes. She has no rales. She exhibits no tenderness.  Abdominal: Soft. Bowel sounds are normal. She exhibits distension and mass. There is tenderness. There is no rebound and no guarding.  Diffuse abdominal tenderness and distention.  Notable mass in the lower abdomen.  Musculoskeletal: Normal range of motion. She exhibits no edema or tenderness.  No definite midline thoracic or lumbar tenderness.  No CVA tenderness.  Distal pulses intact.  Neurological: She is alert and oriented to person, place, and time.  Moving all extremities without focal deficit.  Sensation intact.  Skin: Skin is warm and dry. Capillary refill takes less than 2 seconds. No rash noted. No erythema.  Psychiatric: Her behavior is normal.  Nursing note and vitals reviewed.    ED Treatments / Results  Labs (all labs ordered are listed, but only abnormal results are displayed) Labs Reviewed  CBC WITH DIFFERENTIAL/PLATELET - Abnormal; Notable for the following components:      Result Value   RBC 3.85 (*)     Hemoglobin 11.8 (*)    Platelets 118 (*)    All other components within normal limits  COMPREHENSIVE METABOLIC PANEL - Abnormal; Notable for the following components:   Sodium 134 (*)    Calcium 8.7 (*)    Albumin 3.2 (*)    All other components within normal limits  URINALYSIS, ROUTINE W REFLEX MICROSCOPIC - Abnormal; Notable for the following components:   Hgb urine dipstick MODERATE (*)    Protein, ur 30 (*)    Leukocytes, UA TRACE (*)    All other components within normal limits  LIPASE, BLOOD    EKG None  Radiology Ct Chest W Contrast  Result Date: 12/27/2017 CLINICAL DATA:  Abdominal pain and cramping with facial swelling. Allergic reaction. Metastatic uterine cancer. EXAM: CT CHEST WITH CONTRAST TECHNIQUE: Multidetector CT imaging of the chest was performed during intravenous contrast administration. CONTRAST:  169m OMNIPAQUE IOHEXOL 300 MG/ML  SOLN COMPARISON:  Abdominopelvic CT earlier today CT chest, abdomen and pelvis 11/20/2016. FINDINGS: Cardiovascular: Right IJ Port-A-Cath extends to the superior cavoatrial junction. There is limited opacification of the lower SVC (axial images 58 through 75/6) which could reflect thrombus or central venous stenosis. The upper SVC and brachiocephalic veins are patent. The systemic and pulmonary arteries appear normal. The heart size is normal. There is no significant pericardial effusion. Mediastinum/Nodes: There are no enlarged mediastinal, hilar or axillary lymph nodes. There is mild generalized mediastinal edema. The thyroid gland and trachea appear normal. There is mild distal esophageal wall thickening. Lungs/Pleura: Trace bilateral pleural effusions. Mild emphysema. No airspace disease or suspicious pulmonary nodule. Upper Abdomen: Multifocal hepatic metastatic disease as seen on earlier abdominal CT. Partially imaged bilateral hydronephrosis with retained contrast in the left collecting system and minimal contrast in the right collecting  system. Musculoskeletal: No suspicious osseous findings. Generalized soft tissue edema without focal fluid collection. IMPRESSION: 1. No evidence of metastatic disease in the chest. 2. Generalized edema throughout the subcutaneous and mediastinal fat. Small bilateral pleural effusions. 3. Possible thrombus versus stenosis of the lower SVC adjacent to the Port-A-Cath. This could contribute to the patient's facial swelling. 4.  Emphysema (ICD10-J43.9). 5. Hepatic metastatic disease as described on earlier abdominal CT. Bilateral hydronephrosis with retained contrast in the left collecting system. Patient has bilateral ureteral stents. Electronically Signed   By: Richardean Sale M.D.   On: 12/27/2017 19:28   Ct Abdomen Pelvis W Contrast  Result Date: 12/27/2017 CLINICAL DATA:  Abdominal pain, history of uterine carcinoma EXAM: CT ABDOMEN AND PELVIS WITH CONTRAST TECHNIQUE: Multidetector CT imaging of the abdomen and pelvis was performed using the standard protocol following bolus administration of intravenous contrast. CONTRAST:  144m OMNIPAQUE IOHEXOL 300 MG/ML  SOLN COMPARISON:  09/28/2017 FINDINGS: Lower chest: Lung bases are free of acute infiltrate or sizable effusion. Hepatobiliary: Liver demonstrates increase in both size and number of metastatic lesions. The dominant lesion lies within the midportion of the right lobe measuring approximately 3.6 cm in greatest dimension. This is an increase from 16 mm on the prior exam. Stable hepatic cyst is noted within the medial segment of the left lobe of the liver. Gallbladder is well distended. Some non dependent foci of air are noted likely related to small gallstones. No pericholecystic fluid is noted. Pancreas: Small calcification is again seen in the pancreatic head. Pancreas is otherwise within normal limits. Spleen: Normal in size without focal abnormality. Adrenals/Urinary Tract: Adrenal glands are within normal limits. Bilateral hydronephrotic changes are  again identified and stable. Bilateral ureteral stents are seen in satisfactory position. The degree of dilatation is likely related to reflux from the bladder. Bladder is partially distended and demonstrates significant wall thickening along its right lateral aspect and posterior wall as well as superiorly. These changes would be consistent with metastatic invasion from the adjacent pelvic mass. Stomach/Bowel: Stomach is decompressed. No colonic obstructive changes are seen. The distal sigmoid/rectosigmoid region is surrounded by metastatic disease similar to that noted on the prior exam. No resultant obstructive changes are seen. Vascular/Lymphatic: Vascular calcifications are noted without aneurysmal dilatation. Considerable retroperitoneal lymphadenopathy is noted in the periaortic, pericaval and intra-aortocaval region. Index lymph node in the periaortic region on image number 34 of series 3 now measures 14 mm in short axis and previously measured 11 mm in short axis. The intra-aortocaval node at a similar level now measures almost 16 mm in short axis. It previously measured 8 mm in short axis. New right inguinal adenopathy is noted measuring 13 mm in short axis. Reproductive: The uterus and ovaries have been removed consistent with the given clinical history. In the pelvis in the expected region of the uterus there are again peritoneal implants identified. Central decreased attenuation is noted consistent with a degree of central necrosis. The previously seen separate implants now appear to represent 1 confluent mass extending superiorly along the anterior aspect of the sacrum. There is also apparent local invasion into the bladder wall as previously described. The overall dimension of the pelvic mass measures approximately 7.5 cm in greatest transverse dimension increased from approximately 6.6 cm in greatest transverse dimension. Increased bulk is noted as well. Given the relative circumferential encasement  of the rectosigmoid the possibility of local invasion into the bowel wall cannot be totally excluded. No perforation is identified at this time. Other: No significant ascites is seen.  No hernia is identified. Musculoskeletal: Bony structures show no lytic or sclerotic foci to suggest metastatic disease. No compression deformities are seen. IMPRESSION: Significant progression of metastatic disease. Increase in both size and number of hepatic metastatic lesions is seen. There is now identified local invasion involving the bladder wall particularly within the posterior  and right bladder wall. The peritoneal implants seen previously now all represent 1 large confluent mass which has encircled the rectosigmoid. Localized invasion into the bowel wall deserves consideration. There is also significant increase in retroperitoneal adenopathy. Bilateral ureteral stents with persistent hydronephrosis and hydroureter likely related to reflux from the bladder. Changes suggestive of gas containing gallstones. No pericholecystic fluid is identified. Electronically Signed   By: Inez Catalina M.D.   On: 12/27/2017 16:39    Procedures Procedures (including critical care time)  Medications Ordered in ED Medications  sodium chloride 0.9 % bolus 1,000 mL (0 mLs Intravenous Stopped 12/27/17 1509)  ondansetron (ZOFRAN) injection 4 mg (4 mg Intravenous Given 12/27/17 1356)  morphine 4 MG/ML injection 4 mg (4 mg Intravenous Given 12/27/17 1356)  iohexol (OMNIPAQUE) 300 MG/ML solution 100 mL (100 mLs Intravenous Contrast Given 12/27/17 1610)  methylPREDNISolone sodium succinate (SOLU-MEDROL) 125 mg/2 mL injection 125 mg (125 mg Intravenous Given 12/27/17 1822)  diphenhydrAMINE (BENADRYL) injection 25 mg (25 mg Intravenous Given 12/27/17 1822)  famotidine (PEPCID) IVPB 20 mg premix (0 mg Intravenous Stopped 12/27/17 1908)  HYDROmorphone (DILAUDID) injection 1 mg (1 mg Intravenous Given 12/27/17 1821)  iohexol (OMNIPAQUE) 300 MG/ML solution 100  mL (100 mLs Intravenous Contrast Given 12/27/17 1905)  HYDROmorphone (DILAUDID) injection 1 mg (1 mg Intravenous Given 12/27/17 2029)     Initial Impression / Assessment and Plan / ED Course  I have reviewed the triage vital signs and the nursing notes.  Pertinent labs & imaging results that were available during my care of the patient were reviewed by me and considered in my medical decision making (see chart for details).     CT abdomen pelvis with extensive disease.  CT chest with possible thrombosis in the SVC around the Port-A-Cath.  Discussed with hospitalist who will see patient in the emergency department.  Discussed with Dr. Oneida Alar who recommends heparin.  If patient continues to be symptomatic and vascular surgery is needed, admitting team can reconsult tomorrow.  Final Clinical Impressions(s) / ED Diagnoses   Final diagnoses:  Generalized abdominal pain  Facial swelling    ED Discharge Orders    None       Julianne Rice, MD 12/27/17 2133

## 2017-12-27 NOTE — ED Notes (Signed)
Patient transported to CT 

## 2017-12-27 NOTE — ED Triage Notes (Signed)
Pt brought in by [facility] from home for allergic reaction and abdominal pain that started yesterday. Pt endorses facial swelling and abdominal cramping. Pt respiratory status is not compromised. Pt given 50mg  benadryl PTA with slight improved to swelling. Pt received cancer treatment Wednesday- carboplatin/paxitocel. Pt A+Ox4.

## 2017-12-28 DIAGNOSIS — R220 Localized swelling, mass and lump, head: Secondary | ICD-10-CM

## 2017-12-28 DIAGNOSIS — Z515 Encounter for palliative care: Secondary | ICD-10-CM

## 2017-12-28 DIAGNOSIS — Z7189 Other specified counseling: Secondary | ICD-10-CM

## 2017-12-28 LAB — COMPREHENSIVE METABOLIC PANEL
ALBUMIN: 3.2 g/dL — AB (ref 3.5–5.0)
ALT: 18 U/L (ref 0–44)
ANION GAP: 10 (ref 5–15)
AST: 28 U/L (ref 15–41)
Alkaline Phosphatase: 79 U/L (ref 38–126)
BUN: 12 mg/dL (ref 6–20)
CHLORIDE: 99 mmol/L (ref 98–111)
CO2: 25 mmol/L (ref 22–32)
Calcium: 8.9 mg/dL (ref 8.9–10.3)
Creatinine, Ser: 0.86 mg/dL (ref 0.44–1.00)
GFR calc Af Amer: >60 mL/min (ref >60)
GFR calc non Af Amer: >60 mL/min (ref >60)
GLUCOSE: 105 mg/dL — AB (ref 70–99)
Potassium: 4.3 mmol/L (ref 3.5–5.1)
SODIUM: 134 mmol/L — AB (ref 135–145)
Total Bilirubin: 1 mg/dL (ref 0.3–1.2)
Total Protein: 6.7 g/dL (ref 6.5–8.1)

## 2017-12-28 LAB — CBC
HCT: 34.2 % — ABNORMAL LOW (ref 36.0–46.0)
Hemoglobin: 11.2 g/dL — ABNORMAL LOW (ref 12.0–15.0)
MCH: 30.5 pg (ref 26.0–34.0)
MCHC: 32.7 g/dL (ref 30.0–36.0)
MCV: 93.2 fL (ref 78.0–100.0)
PLATELETS: 85 10*3/uL — AB (ref 150–400)
RBC: 3.67 MIL/uL — AB (ref 3.87–5.11)
RDW: 14.2 % (ref 11.5–15.5)
WBC: 2.9 10*3/uL — AB (ref 4.0–10.5)

## 2017-12-28 LAB — HEPARIN LEVEL (UNFRACTIONATED)
Heparin Unfractionated: 0.31 IU/mL (ref 0.30–0.70)
Heparin Unfractionated: 0.24 IU/mL — ABNORMAL LOW (ref 0.30–0.70)

## 2017-12-28 LAB — PHOSPHORUS: Phosphorus: 3.9 mg/dL (ref 2.5–4.6)

## 2017-12-28 LAB — MAGNESIUM: Magnesium: 1.7 mg/dL (ref 1.7–2.4)

## 2017-12-28 MED ORDER — BISACODYL 10 MG RE SUPP
10.0000 mg | Freq: Every day | RECTAL | Status: DC
  Filled 2017-12-28: qty 1

## 2017-12-28 MED ORDER — METHYLNALTREXONE BROMIDE 12 MG/0.6ML ~~LOC~~ SOLN
4.0000 mg | Freq: Once | SUBCUTANEOUS | Status: AC
  Administered 2017-12-28: 4 mg via SUBCUTANEOUS
  Filled 2017-12-28: qty 0.6

## 2017-12-28 MED ORDER — HYDROMORPHONE HCL 1 MG/ML IJ SOLN
INTRAMUSCULAR | Status: AC
  Administered 2017-12-28: 1 mg via INTRAVENOUS
  Filled 2017-12-28: qty 1

## 2017-12-28 MED ORDER — HYDROMORPHONE HCL 1 MG/ML IJ SOLN
1.0000 mg | INTRAMUSCULAR | Status: DC | PRN
  Administered 2017-12-28 – 2017-12-29 (×7): 1 mg via INTRAVENOUS
  Filled 2017-12-28 (×8): qty 1

## 2017-12-28 MED ORDER — HYDROMORPHONE HCL 1 MG/ML IJ SOLN
1.0000 mg | INTRAMUSCULAR | Status: AC
  Administered 2017-12-28: 1 mg via INTRAVENOUS

## 2017-12-28 MED ORDER — KETOROLAC TROMETHAMINE 60 MG/2ML IM SOLN
60.0000 mg | INTRAMUSCULAR | Status: AC
  Administered 2017-12-28: 60 mg via INTRAMUSCULAR

## 2017-12-28 MED ORDER — LORAZEPAM 2 MG/ML IJ SOLN
INTRAMUSCULAR | Status: AC
  Administered 2017-12-28: 0.5 mg via INTRAVENOUS
  Filled 2017-12-28: qty 1

## 2017-12-28 MED ORDER — KETOROLAC TROMETHAMINE 30 MG/ML IJ SOLN
INTRAMUSCULAR | Status: AC
Start: 2017-12-28 — End: 2017-12-28
  Administered 2017-12-28: 60 mg via INTRAMUSCULAR
  Filled 2017-12-28: qty 2

## 2017-12-28 MED ORDER — SENNA 8.6 MG PO TABS
2.0000 | ORAL_TABLET | Freq: Two times a day (BID) | ORAL | Status: DC
  Filled 2017-12-28: qty 2

## 2017-12-28 MED ORDER — NALOXONE HCL 0.4 MG/ML IJ SOLN: 0.4000 mg | INTRAMUSCULAR | Status: DC | PRN

## 2017-12-28 MED ORDER — ENOXAPARIN SODIUM 60 MG/0.6ML ~~LOC~~ SOLN
50.0000 mg | Freq: Two times a day (BID) | SUBCUTANEOUS | Status: DC
Start: 2017-12-28 — End: 2017-12-31
  Administered 2017-12-28 – 2017-12-31 (×6): 50 mg via SUBCUTANEOUS
  Filled 2017-12-28 (×7): qty 0.5

## 2017-12-28 MED ORDER — FENTANYL 25 MCG/HR TD PT72
50.0000 ug | MEDICATED_PATCH | TRANSDERMAL | Status: DC
  Administered 2017-12-28 – 2017-12-31 (×2): 50 ug via TRANSDERMAL
  Filled 2017-12-28 (×2): qty 2

## 2017-12-28 MED ORDER — METHYLNALTREXONE BROMIDE 12 MG/0.6ML ~~LOC~~ SOLN: 8.0000 mg | Freq: Once | SUBCUTANEOUS | Status: DC

## 2017-12-28 MED ORDER — LORAZEPAM 2 MG/ML IJ SOLN
0.5000 mg | INTRAMUSCULAR | Status: AC
  Administered 2017-12-28: 0.5 mg via INTRAVENOUS

## 2017-12-28 NOTE — Progress Notes (Signed)
[place] for Heparin Indication: VTE Treatment  Allergies  Allergen Reactions  . Lenvima [Lenvatinib] Other (See Comments)    seizure    Patient Measurements: Height: 5' 5"  (165.1 cm) Weight: 101 lb 12.8 oz (46.2 kg) IBW/kg (Calculated) : 57 Heparin Dosing Weight: 47kg  Vital Signs: Temp: 98.4 F (36.9 C) (08/03 0400) Temp Source: Oral (08/03 0400) BP: 178/96 (08/03 0400) Pulse Rate: 110 (08/03 0400)  Labs: Recent Labs    12/27/17 1357 12/28/17 0414  HGB 11.8* 11.2*  HCT 36.8 34.2*  PLT 118* 85*  HEPARINUNFRC  --  0.31  CREATININE 0.79 0.86    Estimated Creatinine Clearance: 55.8 mL/min (by C-G formula based on SCr of 0.86 mg/dL).   Medical History: Past Medical History:  Diagnosis Date  . Anxiety   . Anxiety and depression   . Cancer [facility])    uterine cancer  . Depression   . Genetic testing 01/10/2017   Ms. Figuero underwent genetic counseling and testing for hereditary cancer syndromes on 12/26/2016. Her results were negative for mutations in all 83 genes analyzed by [facility]'s 83-gene Multi-Cancers Panel. Genes analyzed include: ALK, APC, ATM, AXIN2, BAP1, BARD1, BLM, BMPR1A, BRCA1, BRCA2, BRIP1, CASR, CDC73, CDH1, CDK4, CDKN1B, CDKN1C, CDKN2A, CEBPA, CHEK2, CTNNA1, DICER1, DIS3L2, EGFR, EPCAM, F     Assessment: 23 yoF with PMH cancer admitted with abdominal pain. 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   Initial heparin level therapeutic, Hgb 11.2, plts down some to 85 (monitor)  Goal of Therapy:  Heparin level 0.3-0.7 units/ml Monitor platelets by anticoagulation protocol: Yes   Plan:  Cont heparin at 750 units/hr 1200 HL Trend Plts  Narda Bonds, PharmD, BCPS Clinical Pharmacist Phone: [phone]

## 2017-12-28 NOTE — Progress Notes (Signed)
[place] TEAM 1 - Stepdown/ICU TEAM  Kerry Morris  CVE:[id number] DOB: [birth year] DOA: 12/27/2017 PCP: System, Pcp Not In    Brief Narrative:  52yo F w/ a hx of endometrial cancer metastatic to the liver with bilateral hydronephrosis and extensive intra-abdominal metastases currently on palliative chemotherapy at [facility] who presented w/ 24hrs of abdominal pain, as well as severe swelling in her neck and face.  Two days prior she received a dose of carboplatin/paxitocel  Significant Events: 8/2 admit  Subjective: The patient was sleeping comfortably in bed at the time of visit.  Given that pain control has been a primary focus of this admission I chose not to wake her up.  There was no evidence of respiratory distress.  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 discussed with pt option of port removal or lifelong lovenox - she has opted for lifelong lovenox (treat with therapeutic not DVT dose) - transition to lovenox today   Abdominal pain Due to intraabdominal metastatic CA + chronic constipation - Palliative Care assisting in pain control as pt requires very high dose med tx   Bilateral hydronephrosis s/p B ureteral stents  Renal fxn stable at this time  Recent Labs  Lab 12/27/17 1357 12/28/17 0414  CREATININE 0.79 0.86    Widely metastatic endometrial carcinoma significantly progressed per CT abdom compared to May 2019 - no further curative tx available - current chemo is palliative only w/ hopes of giving her more time   Chronic severe constipation  DVT prophylaxis: lovenox  Code Status: FULL CODE Family Communication: no family present at time of exam  Disposition Plan:   Consultants:  Palliative Care Vascular Surgery  Antimicrobials:  none  Objective: Blood pressure (!) 178/96, pulse (!) 110, temperature 98.4 F (36.9 C), temperature source Oral, resp. rate (!) 22, height 5\' 5"  (1.651 m), weight 46.2 kg (101 lb 12.8  oz), SpO2 97 %.  Intake/Output Summary (Last 24 hours) at 12/28/2017 1605 Last data filed at 12/28/2017 0906 Gross per 24 hour  Intake 172.59 ml  Output -  Net 172.59 ml   Filed Weights   12/27/17 1207 12/27/17 2152  Weight: 47.2 kg (104 lb) 46.2 kg (101 lb 12.8 oz)    Examination: General: No acute respiratory distress - resting comfortably in bed   CBC: Recent Labs  Lab 12/27/17 1357 12/28/17 0414  WBC 5.3 2.9*  NEUTROABS 4.3  --   HGB 11.8* 11.2*  HCT 36.8 34.2*  MCV 95.6 93.2  PLT 118* 85*   Basic Metabolic Panel: Recent Labs  Lab 12/27/17 1357 12/28/17 0414  NA 134* 134*  K 3.7 4.3  CL 98 99  CO2 24 25  GLUCOSE 94 105*  BUN 9 12  CREATININE 0.79 0.86  CALCIUM 8.7* 8.9  MG  --  1.7  PHOS  --  3.9   GFR: Estimated Creatinine Clearance: 55.8 mL/min (by C-G formula based on SCr of 0.86 mg/dL).  Liver Function Tests: Recent Labs  Lab 12/27/17 1357 12/28/17 0414  AST 32 28  ALT 17 18  ALKPHOS 85 79  BILITOT 0.7 1.0  PROT 6.8 6.7  ALBUMIN 3.2* 3.2*   Recent Labs  Lab 12/27/17 1357  LIPASE 30    Scheduled Meds: . fentaNYL  50 mcg Transdermal Q72H  . methadone  5 mg Oral BID  . pantoprazole  40 mg Oral Daily  . sodium chloride flush  3 mL Intravenous Q12H   Continuous  Infusions: . sodium chloride    . heparin 750 Units/hr (12/27/17 2221)     LOS: 1 day   Cherene Altes, MD [facility] Office  [phone] Pager - Text Page per Amion  If 7PM-7AM, please contact night-coverage per Amion 12/28/2017, 4:05 PM

## 2017-12-28 NOTE — Consult Note (Signed)
Referring Physician: Dr Roel Cluck  Patient name: Kerry Morris MRN: [id number] DOB: 1966-05-16 Sex: female  REASON FOR CONSULT: facial swelling  HPI: Kerry Morris is a 52 y.o. female, with right side portacath currently being used for chemo for endometrial cancer.  Pt noticed some facial swelling right side on same side as catheter a few days ago.  She denies headaches.    Past Medical History:  Diagnosis Date  . Anxiety   . Anxiety and depression   . Cancer [facility])    uterine cancer  . Depression   . Genetic testing 01/10/2017   Ms. Augustus underwent genetic counseling and testing for hereditary cancer syndromes on 12/26/2016. Her results were negative for mutations in all 83 genes analyzed by [facility]'s 83-gene Multi-Cancers Panel. Genes analyzed include: ALK, APC, ATM, AXIN2, BAP1, BARD1, BLM, BMPR1A, BRCA1, BRCA2, BRIP1, CASR, CDC73, CDH1, CDK4, CDKN1B, CDKN1C, CDKN2A, CEBPA, CHEK2, CTNNA1, DICER1, DIS3L2, EGFR, EPCAM, F   Past Surgical History:  Procedure Laterality Date  . ABDOMINAL HYSTERECTOMY    . CERVICAL BIOPSY  W/ LOOP ELECTRODE EXCISION    . DENTAL SURGERY    . IR FLUORO GUIDE PORT INSERTION RIGHT  04/23/2017  . IR US GUIDE VASC ACCESS RIGHT  04/23/2017    Family History  Problem Relation Age of Onset  . Leukemia Mother 41       in remission  . Throat cancer Maternal Aunt        d.>50  . Alcohol abuse Maternal Uncle        d.>50    SOCIAL HISTORY: Social History   Socioeconomic History  . Marital status: Married    Spouse name: Kirtland Bouchard  . Number of children: 0  . Years of education: Not on file  . Highest education level: Not on file  Occupational History  . 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 Financial resource strain: Not on file  . Food insecurity:    Worry: Not on file    Inability: Not on file  . Transportation needs:    Medical: Not on file    Non-medical: Not on file  Tobacco Use  . Smoking status: Former Smoker   Packs/day: 1.00    Years: 25.00    Pack years: 25.00    Types: Cigarettes    Last attempt to quit: 03/28/2015    Years since quitting: 2.7  . Smokeless tobacco: Never Used  Substance and Sexual Activity  . Alcohol use: No    Alcohol/week: 0.0 oz    Comment: Previous h/o alcoholism, sober x 8 years  . Drug use: Not Currently    Types: Marijuana    Comment: remote h/o IVDU, has been worked up for parenterally transmitted infections multiple times since  . Sexual activity: Yes    Partners: Male  Lifestyle  . Physical activity:    Days per week: Not on file    Minutes per session: Not on file  . Stress: Not on file  Relationships  . Social connections:    Talks on phone: Not on file    Gets together: Not on file    Attends religious service: Not on file    Active member of club or organization: Not on file    Attends meetings of clubs or organizations: Not on file    Relationship status: Not on file  . Intimate partner violence:    Fear of current or ex partner: Not on file    Emotionally abused:  Not on file    Physically abused: Not on file    Forced sexual activity: Not on file  Other Topics Concern  . Not on file  Social History Narrative   Lives with husband who is also her business partner. They own and run a bar/club. Recent financial difficulties have been a significant source of stress for them. In addition, patient is grieving the loss of a close family member/friend in Oct 2016. As a result, Genavie reports poor diet for the past several months with notable unintentional weight loss.    Allergies  Allergen Reactions  . Lenvima [Lenvatinib] Other (See Comments)    seizure    Current Facility-Administered Medications  Medication Dose Route Frequency Provider Last Rate Last Dose  . 0.9 %  sodium chloride infusion  250 mL Intravenous PRN Doutova, Nyoka Lint, MD      . acetaminophen (TYLENOL) tablet 650 mg  650 mg Oral Q6H PRN Doutova, Anastassia, MD       Or  .  acetaminophen (TYLENOL) suppository 650 mg  650 mg Rectal Q6H PRN Doutova, Anastassia, MD      . ALPRAZolam Duanne Moron) tablet 0.5 mg  0.5 mg Oral QHS PRN Doutova, Anastassia, MD      . bisacodyl (DULCOLAX) suppository 10 mg  10 mg Rectal Daily PRN Toy Baker, MD      . bisacodyl (DULCOLAX) suppository 10 mg  10 mg Rectal Daily Bullard, Elray Mcgregor, NP      . fentaNYL (DURAGESIC - dosed mcg/hr) patch 50 mcg  50 mcg Transdermal Q72H Bullard, Elray Mcgregor, NP      . heparin ADULT infusion 100 units/mL (25000 units/291m sodium chloride 0.45%)  750 Units/hr Intravenous Continuous BEinar Grad RPH 7.5 mL/hr at 12/27/17 2221 750 Units/hr at 12/27/17 2221  . HYDROmorphone (DILAUDID) injection 1 mg  1 mg Intravenous Q2H PRN Bullard, SElray Mcgregor NP   1 mg at 12/28/17 0940  . methadone (DOLOPHINE) tablet 5 mg  5 mg Oral BID DToy Baker MD   5 mg at 12/28/17 0904  . naloxone (NARCAN) injection 0.4 mg  0.4 mg Intravenous PRN Bullard, SElray Mcgregor NP      . ondansetron ([facility] tablet 4 mg  4 mg Oral Q6H PRN DToy Baker MD       Or  . ondansetron (ZOFRAN) injection 4 mg  4 mg Intravenous Q6H PRN Doutova, Anastassia, MD      . oxyCODONE (Oxy IR/ROXICODONE) immediate release tablet 10 mg  10 mg Oral Q4H PRN DToy Baker MD   10 mg at 12/28/17 0814  . pantoprazole (PROTONIX) EC tablet 40 mg  40 mg Oral Daily DToy Baker MD   40 mg at 12/28/17 0903  . senna (SENOKOT) tablet 17.2 mg  2 tablet Oral BID Bullard, SElray Mcgregor NP      . sodium chloride flush (NS) 0.9 % injection 3 mL  3 mL Intravenous Q12H DToy Baker MD   3 mL at 12/28/17 0906  . sodium chloride flush (NS) 0.9 % injection 3 mL  3 mL Intravenous PRN DToy Baker MD        ROS:     Hematologic:No history of hypercoagulable state.  No history of easy bleeding.  No history of anemia  Gastrointestinal: No hematochezia or melena,  No gastroesophageal reflux, no trouble  swallowing  Skin: No rashes    Physical Examination  Vitals:   12/27/17 2000 12/27/17 2152 12/27/17 2357 12/28/17 0400  BP: (!) 163/102 (!) 190/117 (!) 153/119 (Marland Kitchen  178/96  Pulse: 99 93 93 (!) 110  Resp: 16 16 18  (!) 22  Temp:  97.7 F (36.5 C) 98.2 F (36.8 C) 98.4 F (36.9 C)  TempSrc:  Oral Oral Oral  SpO2: 96% 96% 94% 97%  Weight:  101 lb 12.8 oz (46.2 kg)    Height:  5' 5"  (1.651 m)      Body mass index is 16.94 kg/m.  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 Mildly symptomatic   PLAN:  Discussed with pt options of port removal or lifelong lovenox.  She has opted for lifelong lovenox.  Would treat with therapeutic not DVT dose  No further recs   Ruta Hinds, MD [facility] Office: [phone] Pager: [phone]

## 2017-12-28 NOTE — Consult Note (Addendum)
Consultation Note Date: 12/28/2017   Patient Name: Kerry Morris  DOB: 03-May-1966  MRN: [id number]  Age / Sex: 52 y.o., female  PCP: System, [place] Not In Referring Physician: Cherene Altes, MD  Reason for Consultation: Establishing goals of care, Pain control and Psychosocial/spiritual support  HPI/Patient Profile: 52 y.o. 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 Per ED notes, initial impression was possible allergic reaction and she was given solumedrol. Imaging revealed SVT. Vascular, Dr. Oneida Alar, was consulted and she was started on heparin.  Consult ordered for [place]. Palliative medicine service has seen Kerry Morris 5/19 for pain mgt.   Clinical Assessment and Goals of Care: Pt seen, chart reviewed. When I arrived to the room, pt was in a pain crisis, in a fetal position, crying; stating " I just want to cut my head off". Rated pain as a 10/10, abd radiating to back. She describes it has severe, constant and crampy. She has not had a BM in 5 days. In the past mag citrate has helped but she states she cannot take at this point because of the pain. Per chart review and pt report, pt states she has never gone off methadone and has been on methadone '5mg'$  BID since Palliative last saw her in May. She reported that Toradol. IM "is the only thing that helps but I know I can't take it too much because of my kidneys". She reported MS04 ineffective. When seen in May by Dr. Domingo Cocking, she continued to endorse severe abd pain despite fentanyl 327mg gtt. She herself tells me this am that she did not receive any releif from PCA. Pt also states she has been on Fentanyl TD 25 mcg for almost 30 days.  Administered dilaudid total 2 mg IV and ativan 0.5 IV as well as Toradol '60mg'$  x1  IM. Pt is now resting comfortably.  Per her husband, [place] [facility] [place] Her last chemo was Tues, July 30, carboplatin/paxitocel.  Additionally she sees a Dr. HElnoria Howardin ADeltafor SQ injections of "Viscum" ( a mistletoe derivative?). Last injection, '100mg'$  SQ on 12/26/17   Pt at this point is able to speak for herself. Her healthcare proxy were she unable to do so would be her husband, HCharmian [phone]. Per spouse, [place] were [place] [facility] [place] [facility] [place] [facility] [place]. Mr PVevelyn Francoisis not aware of what her code status is    SUMMARY OF RECOMMENDATIONS   Continue full code for now. When pt is more comfortable will address GCulverHusband shares that it is her desire to take more chemo if it will extend her life and if she can tolerate it Awaiting attending and vascular input Code Status/Advance Care Planning:  Full code    Symptom Management:   Pain: Will DC MS04 2/2 lack of effect. Will order dilaudid IV '1mg'$  q2  PRN and up titrate fentanyl TD 50 mcg q 72hs. Pt is not opioid naive and per chart review in previous pain crises has required higher doses to manage pain. Continue methadone '5mg'$  BID. Consider adding decadron 8 mg daily.  Anxiety: Ativan 0.5-1 q4 PRN. Monitor for need for scheduled dosing  Constipation: Will start with dulcolax supp daily and senna 2 tabs BID. If no results consider relistor on 12/29/17  Addendum: Pt's pain under control. Will DC laxatives and give relistor '8mg'$  SQ now.  Pt in agreement  Palliative Prophylaxis:   Bowel Regimen, Delirium Protocol, Eye Care, Frequent Pain Assessment and Turn Reposition  Additional Recommendations (Limitations, Scope, Preferences):  Full Scope Treatment  Psycho-social/Spiritual:   Desire for further Chaplaincy support:no   Prognosis:   < 6 months in the setting of metastatic endometrial cancer now with SVC. Pt would qualify for her hospice benefit if  this is in keeping with her goals. She would not be able to elect hospice however if she continues with palliative chemo  Discharge Planning: To Be Determined      Primary Diagnoses: Present on Admission: . Abdominal pain . Bilateral hydronephrosis . Cancer associated pain . Endometrial cancer ([place]) . Other constipation . Facial edema . SVC syndrome   I have reviewed the medical record, interviewed the patient and family, and examined the patient. The following aspects are pertinent.  Past Medical History:  Diagnosis Date  . Anxiety   . Anxiety and depression   . Cancer [facility])    uterine cancer  . Depression   . Genetic testing 01/10/2017   Kerry Morris underwent genetic counseling and testing for hereditary cancer syndromes on 12/26/2016. Her results were negative for mutations in all 83 genes analyzed by [facility]'s 83-gene Multi-Cancers Panel. Genes analyzed include: ALK, APC, ATM, AXIN2, BAP1, BARD1, BLM, BMPR1A, BRCA1, BRCA2, BRIP1, CASR, CDC73, CDH1, CDK4, CDKN1B, CDKN1C, CDKN2A, CEBPA, CHEK2, CTNNA1, DICER1, DIS3L2, EGFR, EPCAM, F   Social History   Socioeconomic History  . Marital status: Married    Spouse name: Kirtland Bouchard  . Number of children: 0  . Years of education: Not on file  . Highest education level: Not on file  Occupational History  . 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 Financial resource strain: Not on file  . Food insecurity:    Worry: Not on file    Inability: Not on file  . Transportation needs:    Medical: Not on file    Non-medical: Not on file  Tobacco Use  . Smoking status: Former Smoker    Packs/day: 1.00    Years: 25.00    Pack years: 25.00    Types: Cigarettes    Last attempt to quit: 03/28/2015    Years since quitting: 2.7  . Smokeless tobacco: Never Used  Substance and Sexual Activity  . Alcohol use: No    Alcohol/week: 0.0 oz    Comment: Previous h/o alcoholism, sober x 8 years  . Drug use: Not Currently    Types:  Marijuana    Comment: remote h/o IVDU, has been worked up for parenterally transmitted infections multiple times since  . Sexual activity: Yes    Partners: Male  Lifestyle  . Physical activity:    Days per week: Not on file    Minutes per session: Not on file  . Stress: Not on file  Relationships  . Social connections:    Talks on phone: Not on file    Gets together: Not  on file    Attends religious service: Not on file    Active member of club or organization: Not on file    Attends meetings of clubs or organizations: Not on file    Relationship status: Not on file  Other Topics Concern  . Not on file  Social History Narrative   Lives with husband who is also her business partner. They own and run a bar/club. Recent financial difficulties have been a significant source of stress for them. In addition, patient is grieving the loss of a close family member/friend in Oct 2016. As a result, Arnecia reports poor diet for the past several months with notable unintentional weight loss.   Family History  Problem Relation Age of Onset  . Leukemia Mother 66       in remission  . Throat cancer Maternal Aunt        d.>50  . Alcohol abuse Maternal Uncle        d.>50   Scheduled Meds: . bisacodyl  10 mg Rectal Daily  . fentaNYL  50 mcg Transdermal Q72H  . methadone  5 mg Oral BID  . pantoprazole  40 mg Oral Daily  . senna  2 tablet Oral BID  . sodium chloride flush  3 mL Intravenous Q12H   Continuous Infusions: . sodium chloride    . heparin 750 Units/hr (12/27/17 2221)   PRN Meds:.sodium chloride, acetaminophen **OR** acetaminophen, ALPRAZolam, bisacodyl, HYDROmorphone (DILAUDID) injection, naLOXone (NARCAN)  injection, ondansetron **OR** ondansetron (ZOFRAN) IV, oxyCODONE, sodium chloride flush Medications Prior to Admission:  Prior to Admission medications   Medication Sig Start Date End Date Taking? Authorizing Provider  acetaminophen (TYLENOL) 325 MG tablet Take 325 mg by mouth  every 6 (six) hours as needed for mild pain.   Yes [provider]  ALPRAZolam Duanne Moron) 0.5 MG tablet Take 0.5 mg by mouth at bedtime as needed for anxiety or sleep.   Yes [provider]  celecoxib (CELEBREX) 200 MG capsule Take 200 mg by mouth daily as needed for moderate pain.   Yes [provider]  fentaNYL (DURAGESIC - DOSED MCG/HR) 25 MCG/HR patch Place 25 mcg onto the skin every 3 (three) days.   Yes [provider]  lubiprostone (AMITIZA) 24 MCG capsule Take 24 mcg by mouth 2 (two) times daily.    Yes [provider]  methadone (DOLOPHINE) 5 MG tablet Take 5 mg by mouth 2 (two) times daily.    Yes [provider]  ondansetron (ZOFRAN ODT) 4 MG disintegrating tablet Take 1 tablet (4 mg total) by mouth every 6 (six) hours as needed. Patient taking differently: Take 8 mg by mouth every 6 (six) hours as needed.  09/28/17  Yes Ward, Kristen N, DO  OVER THE COUNTER MEDICATION Inject 1 ampule as directed every other day. Viscum Abietis   Yes [provider]  Oxycodone HCl 10 MG TABS Take 10 mg by mouth every 4 (four) hours as needed (for pain).  08/08/17  Yes [provider]  pantoprazole (PROTONIX) 40 MG tablet Take 40 mg by mouth daily.   Yes [provider]  prochlorperazine (COMPAZINE) 10 MG tablet Take 1 tablet (10 mg total) by mouth every 6 (six) hours as needed for nausea or vomiting. 06/24/17  Yes Alvy Bimler, Ni, MD  senna (SENOKOT) 8.6 MG TABS tablet Take 2 tablets (17.2 mg total) by mouth daily. Patient taking differently: Take 4 tablets by mouth daily.  10/08/17  Yes Velvet Bathe, MD  lidocaine-prilocaine (  EMLA) cream Apply 1 application as needed topically. Patient not taking: Reported on 09/28/2017 04/15/17   Heath Lark, MD  polyethylene glycol [facility]) packet Take 17 g by mouth daily. Patient not taking: Reported on 12/27/2017 10/08/17   Velvet Bathe, MD   Allergies  Allergen Reactions  . Lenvima [Lenvatinib]  Other (See Comments)    seizure   Review of Systems  Unable to perform ROS: Acuity of condition    Physical Exam  Constitutional: She is oriented to person, place, and time.  Cachetic female in acute distress in pain; in a fetal position   HENT:  periorbital and facial edema  Neck: Normal range of motion.  Genitourinary:  Genitourinary Comments: purwick  Musculoskeletal: Normal range of motion.  Neurological: She is alert and oriented to person, place, and time.  Skin: Skin is warm and dry.  Psychiatric:  In severe pain, crying  Nursing note and vitals reviewed.   Vital Signs: BP (!) 178/96 (BP Location: Left Arm)   Pulse (!) 110   Temp 98.4 F (36.9 C) (Oral)   Resp (!) 22   Ht '5\' 5"'$  (1.651 m)   Wt 46.2 kg (101 lb 12.8 oz)   SpO2 97%   BMI 16.94 kg/m  Pain Scale: 0-10   Pain Score: 8    SpO2: SpO2: 97 % O2 Device:SpO2: 97 % O2 Flow Rate: .   IO: Intake/output summary:   Intake/Output Summary (Last 24 hours) at 12/28/2017 1027 Last data filed at 12/28/2017 7124 Gross per 24 hour  Intake 1172.59 ml  Output -  Net 1172.59 ml    LBM: Last BM Date: (pt reports days ago) Baseline Weight: Weight: 47.2 kg (104 lb) Most recent weight: Weight: 46.2 kg (101 lb 12.8 oz)     Palliative Assessment/Data:   Flowsheet Rows     Most Recent Value  Intake Tab  Referral Department  Hospitalist  Unit at Time of Referral  Intermediate Care Unit  Palliative Care Primary Diagnosis  Cancer  Date Notified  12/27/17  Palliative Care Type  Return patient Palliative Care  Reason for referral  Pain, Clarify Goals of Care  Date of Admission  12/27/17  Date first seen by Palliative Care  12/28/17  # of days Palliative referral response time  1 Day(s)  # of days IP prior to Palliative referral  0  Clinical Assessment  Palliative Performance Scale Score  40%  Pain Max last 24 hours  10  Pain Min Last 24 hours  10  Dyspnea Max Last 24 Hours  0  Dyspnea Min Last 24 hours  0    Nausea Max Last 24 Hours  0  Nausea Min Last 24 Hours  0  Anxiety Max Last 24 Hours  7  Anxiety Min Last 24 Hours  7  Other Max Last 24 Hours  0  Psychosocial & Spiritual Assessment  Palliative Care Outcomes  Patient/Family meeting held?  Yes  Who was at the meeting?  pt and husband  Palliative Care Outcomes  Improved pain interventions, Improved non-pain symptom therapy, Provided psychosocial or spiritual support  Palliative Care follow-up planned  Yes, Facility      Time In: 0900 Time Out: 1030 Time Total: 90 min Greater than 50%  of this time was spent counseling and coordinating care related to the above assessment and plan. Staffed with Dr. Thereasa Solo and Dr. Domingo Cocking  Signed by: Dory Horn, NP   Please contact Palliative Medicine Team phone at [phone] for questions and  concerns.  For individual provider: See Shea Evans

## 2017-12-28 NOTE — Progress Notes (Signed)
[place] for Heparin Indication: VTE Treatment  Allergies  Allergen Reactions  . Lenvima [Lenvatinib] Other (See Comments)    seizure    Patient Measurements: Height: 5' 5"  (165.1 cm) Weight: 101 lb 12.8 oz (46.2 kg) IBW/kg (Calculated) : 57 Heparin Dosing Weight: 47kg  Vital Signs: Temp: 98.4 F (36.9 C) (08/03 0400) Temp Source: Oral (08/03 0400) BP: 178/96 (08/03 0400) Pulse Rate: 110 (08/03 0400)  Labs: Recent Labs    12/27/17 1357 12/28/17 0414 12/28/17 1133  HGB 11.8* 11.2*  --   HCT 36.8 34.2*  --   PLT 118* 85*  --   HEPARINUNFRC  --  0.31 0.24*  CREATININE 0.79 0.86  --     Estimated Creatinine Clearance: 55.8 mL/min (by C-G formula based on SCr of 0.86 mg/dL).   Medical History: Past Medical History:  Diagnosis Date  . Anxiety   . Anxiety and depression   . Cancer [facility])    uterine cancer  . Depression   . Genetic testing 01/10/2017   Ms. Mcelwain underwent genetic counseling and testing for hereditary cancer syndromes on 12/26/2016. Her results were negative for mutations in all 83 genes analyzed by [facility]'s 83-gene Multi-Cancers Panel. Genes analyzed include: ALK, APC, ATM, AXIN2, BAP1, BARD1, BLM, BMPR1A, BRCA1, BRCA2, BRIP1, CASR, CDC73, CDH1, CDK4, CDKN1B, CDKN1C, CDKN2A, CEBPA, CHEK2, CTNNA1, DICER1, DIS3L2, EGFR, EPCAM, F     Assessment: 60 yoF with PMH cancer admitted with abdominal pain. 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   Repeat heparin level subtherapeutic at 0.24, Hgb 11.2, plts down some to 85 (monitor)  Goal of Therapy:  Heparin level 0.3-0.7 units/ml Monitor platelets by anticoagulation protocol: Yes   Plan:  Increase heparin to 850 units/hr 2100 Heparin level Daily CBC, monitor s/s bleeding  Follow up on long term anticoagulation plan  Harrietta Guardian, PharmD PGY1 Pharmacy Resident 12/28/2017    4:00 PM

## 2017-12-28 NOTE — Progress Notes (Signed)
ANTICOAGULATION CONSULT NOTE - Initial Consult  Pharmacy Consult for Enoxaparin Indication: VTE/SVC clot treatment  Allergies  Allergen Reactions  . Lenvima [Lenvatinib] Other (See Comments)    seizure    Patient Measurements: Height: 5' 5"  (165.1 cm) Weight: 101 lb 12.8 oz (46.2 kg) IBW/kg (Calculated) : 57   Vital Signs:    Labs: Recent Labs    12/27/17 1357 12/28/17 0414 12/28/17 1133  HGB 11.8* 11.2*  --   HCT 36.8 34.2*  --   PLT 118* 85*  --   HEPARINUNFRC  --  0.31 0.24*  CREATININE 0.79 0.86  --     Estimated Creatinine Clearance: 55.8 mL/min (by C-G formula based on SCr of 0.86 mg/dL).   Medical History: Past Medical History:  Diagnosis Date  . Anxiety   . Anxiety and depression   . Cancer [facility])    uterine cancer  . Depression   . Genetic testing 01/10/2017   Ms. Effertz underwent genetic counseling and testing for hereditary cancer syndromes on 12/26/2016. Her results were negative for mutations in all 83 genes analyzed by [facility]'s 83-gene Multi-Cancers Panel. Genes analyzed include: ALK, APC, ATM, AXIN2, BAP1, BARD1, BLM, BMPR1A, BRCA1, BRCA2, BRIP1, CASR, CDC73, CDH1, CDK4, CDKN1B, CDKN1C, CDKN2A, CEBPA, CHEK2, CTNNA1, DICER1, DIS3L2, EGFR, EPCAM, F     Assessment: 47 yoF with PMH cancer admitted with abdominal pain. CT chest shows possible SVC thrombus, pharmacy consulted for enoxaparin for VTE treatment.    Goal of Therapy:  Anti-Xa level 0.6-1 units/ml 4hrs after LMWH dose given Monitor platelets by anticoagulation protocol: Yes   Plan:  D/C heparin drip Start enoxaparin 38m SQ q12h Monitor for s/sx of bleeding and CBC  Flossie Wexler A. PLevada Dy PharmD, BOdenPager: [phone] utilize Amion for appropriate phone number to reach the unit pharmacist ([place]    12/28/2017,4:20 PM

## 2017-12-29 ENCOUNTER — Encounter (HOSPITAL_COMMUNITY): Payer: Self-pay | Admitting: *Deleted

## 2017-12-29 DIAGNOSIS — F411 Generalized anxiety disorder: Secondary | ICD-10-CM

## 2017-12-29 LAB — COMPREHENSIVE METABOLIC PANEL
ALBUMIN: 3 g/dL — AB (ref 3.5–5.0)
ALT: 15 U/L (ref 0–44)
AST: 27 U/L (ref 15–41)
Alkaline Phosphatase: 68 U/L (ref 38–126)
Anion gap: 9 (ref 5–15)
BUN: 21 mg/dL — AB (ref 6–20)
CHLORIDE: 100 mmol/L (ref 98–111)
CO2: 24 mmol/L (ref 22–32)
Calcium: 8.3 mg/dL — ABNORMAL LOW (ref 8.9–10.3)
Creatinine, Ser: 0.83 mg/dL (ref 0.44–1.00)
GFR calc Af Amer: >60 mL/min (ref >60)
GLUCOSE: 110 mg/dL — AB (ref 70–99)
POTASSIUM: 3.9 mmol/L (ref 3.5–5.1)
SODIUM: 133 mmol/L — AB (ref 135–145)
Total Bilirubin: 0.7 mg/dL (ref 0.3–1.2)
Total Protein: 6.3 g/dL — ABNORMAL LOW (ref 6.5–8.1)

## 2017-12-29 LAB — CBC
HCT: 32.2 % — ABNORMAL LOW (ref 36.0–46.0)
Hemoglobin: 10.5 g/dL — ABNORMAL LOW (ref 12.0–15.0)
MCH: 30.5 pg (ref 26.0–34.0)
MCHC: 32.6 g/dL (ref 30.0–36.0)
MCV: 93.6 fL (ref 78.0–100.0)
PLATELETS: 94 10*3/uL — AB (ref 150–400)
RBC: 3.44 MIL/uL — ABNORMAL LOW (ref 3.87–5.11)
RDW: 14.4 % (ref 11.5–15.5)
WBC: 1.7 10*3/uL — AB (ref 4.0–10.5)

## 2017-12-29 MED ORDER — HYDROMORPHONE HCL 1 MG/ML IJ SOLN
1.0000 mg | INTRAMUSCULAR | Status: DC | PRN
  Administered 2017-12-29: 1 mg via INTRAVENOUS
  Administered 2017-12-29: 2 mg via INTRAVENOUS
  Administered 2017-12-29 – 2017-12-31 (×11): 1 mg via INTRAVENOUS
  Filled 2017-12-29: qty 1
  Filled 2017-12-29: qty 2
  Filled 2017-12-29 (×6): qty 1
  Filled 2017-12-29: qty 2
  Filled 2017-12-29 (×4): qty 1

## 2017-12-29 MED ORDER — OXYCODONE HCL 5 MG PO TABS
15.0000 mg | ORAL_TABLET | ORAL | Status: DC | PRN
  Administered 2017-12-29 – 2017-12-31 (×10): 15 mg via ORAL
  Filled 2017-12-29 (×11): qty 3

## 2017-12-29 MED ORDER — DEXAMETHASONE 6 MG PO TABS
6.0000 mg | ORAL_TABLET | Freq: Every day | ORAL | Status: DC
  Administered 2017-12-29 – 2017-12-31 (×3): 6 mg via ORAL
  Filled 2017-12-29 (×3): qty 1

## 2017-12-29 MED ORDER — ALPRAZOLAM 0.5 MG PO TABS
0.5000 mg | ORAL_TABLET | Freq: Three times a day (TID) | ORAL | Status: DC
  Administered 2017-12-29 – 2017-12-31 (×7): 0.5 mg via ORAL
  Filled 2017-12-29 (×7): qty 1

## 2017-12-29 MED ORDER — KETOROLAC TROMETHAMINE 60 MG/2ML IM SOLN
60.0000 mg | Freq: Once | INTRAMUSCULAR | Status: AC | PRN
  Administered 2017-12-30: 60 mg via INTRAMUSCULAR
  Filled 2017-12-29: qty 2

## 2017-12-29 NOTE — Progress Notes (Signed)
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  Two days prior she received a dose of carboplatin/paxitocel as palliative chemotherapy.    Significant Events: 8/2 admit  Subjective: Pt is alert and pleasant.  She tells me her abdom pain is currently well controlled.  She denies n/v, sob, cp, or HA.    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 discussed with pt option of port removal or lifelong lovenox - she has opted for lifelong lovenox (treat with therapeutic not DVT dose) - sx much improved today on full anticoag w/ markedly less swelling of face/eyes   Abdominal pain Due to intraabdominal metastatic CA + chronic constipation - Palliative Care assisting in pain control as pt requires very high dose med tx   Bilateral hydronephrosis s/p B ureteral stents  Renal fxn stable at this time  Recent Labs  Lab 12/27/17 1357 12/28/17 0414 12/29/17 0243  CREATININE 0.79 0.86 0.83    Widely metastatic endometrial carcinoma significantly progressed per CT abdom compared to May 2019 - no further curative tx available - current chemo is palliative only w/ hopes of giving her more time   Chronic severe constipation  DVT prophylaxis: lovenox  Code Status: FULL CODE Family Communication: spoke w/ husband at bedside   Disposition Plan:   Consultants:  Palliative Care Vascular Surgery  Antimicrobials:  none  Objective: Blood pressure (!) 151/96, pulse 99, temperature 97.7 F (36.5 C), temperature source Oral, resp. rate 15, height 5\' 5"  (1.651 m), weight 46.2 kg (101 lb 12.8  oz), SpO2 95 %.  Intake/Output Summary (Last 24 hours) at 12/29/2017 1532 Last data filed at 12/29/2017 0843 Gross per 24 hour  Intake 60 ml  Output 400 ml  Net -340 ml   Filed Weights   12/27/17 1207 12/27/17 2152  Weight: 47.2 kg (104 lb) 46.2 kg (101 lb 12.8 oz)    Examination: General: No acute respiratory distress Lungs: Clear to auscultation bilaterally without wheezes or crackles Cardiovascular: Regular rate and rhythm without murmur gallop or rub normal S1 and S2 Abdomen: Nontender, nondistended, soft, bowel sounds positive, no rebound, no ascites, no appreciable mass Extremities: No significant cyanosis, clubbing, or edema bilateral lower extremities  CBC: Recent Labs  Lab 12/27/17 1357 12/28/17 0414 12/29/17 0243  WBC 5.3 2.9* 1.7*  NEUTROABS 4.3  --   --   HGB 11.8* 11.2* 10.5*  HCT 36.8 34.2* 32.2*  MCV 95.6 93.2 93.6  PLT 118* 85* 94*   Basic Metabolic Panel: Recent Labs  Lab 12/27/17 1357 12/28/17 0414 12/29/17 0243  NA 134* 134* 133*  K 3.7 4.3 3.9  CL 98 99 100  CO2 24 25 24   GLUCOSE 94 105* 110*  BUN 9 12 21*  CREATININE 0.79 0.86 0.83  CALCIUM 8.7* 8.9 8.3*  MG  --  1.7  --   PHOS  --  3.9  --    GFR: Estimated Creatinine Clearance: 57.8 mL/min (by C-G formula based on SCr of 0.83 mg/dL).  Liver Function Tests: Recent Labs  Lab 12/27/17 1357 12/28/17 0414 12/29/17 0243  AST 32 28 27  ALT 17 18 15   ALKPHOS 85 79 68  BILITOT 0.7 1.0 0.7  PROT 6.8 6.7 6.3*  ALBUMIN 3.2* 3.2* 3.0*   Recent Labs  Lab 12/27/17 1357  LIPASE 30    Scheduled Meds: . ALPRAZolam  0.5 mg Oral TID  . dexamethasone  6 mg Oral Daily  . enoxaparin (LOVENOX) injection  50 mg Subcutaneous Q12H  . fentaNYL  50 mcg Transdermal Q72H  . methadone  5 mg Oral BID  . pantoprazole  40 mg Oral Daily  . sodium chloride flush  3 mL Intravenous Q12H     LOS: 2 days   Cherene Altes, MD [facility] Office  [phone] Pager - Text Page per Amion  If  7PM-7AM, please contact night-coverage per Amion 12/29/2017, 3:32 PM

## 2017-12-29 NOTE — Progress Notes (Signed)
Daily Progress Note   Patient Name: Kerry Morris       Date: 12/29/2017 DOB: 1965/12/05  Age: 52 y.o. MRN#: [id number] Attending Physician: Cherene Altes, MD Primary Care Physician: System, Pcp Not In Admit Date: 12/27/2017  Reason for Consultation/Follow-up: Establishing goals of care, Non pain symptom management, Pain control and Psychosocial/spiritual support  Subjective: Patient seen, chart reviewed.  Patient still is experiencing abdominal pain that is radiating to her back.  She is not in pain crisis as when I met her on 12/28/2017 but she still is very uncomfortable.  She is utilizing multiple breakthrough doses of Dilaudid IV as well as oxycodone 10 mg orally.  Per chart review she has utilized 7 mg of IV hydromorphone and a 24-hour.,  50 mg oral oxycodone, fentanyl patch placed 12/28/2017 50 mcg (previous dosage 25 mcg every 72 hours).  She continues to endorse that Toradol IM 60 mg injection works the best for her.  Per vascular note, plan going forward regarding thrombus in SVC is lifelong usage of Lovenox versus removal of Port-A-Cath.  Attempted to introduce topics regarding goals of care, specifically is she going to pursue palliative chemotherapy, pain management on an outpatient basis.  Patient tells me that she is frightened, "I just do not know what to do anymore".  Her plan is to talk to her husband to see what his thoughts are going forward.  She shares with me that she has been trying to fight for so many years but she is beginning to see and understand, that she is dying.  I did share with her that she would qualify for hospice services who would ensure that she had adequate pain control and management but that she could not do that as long as she was taking palliative chemotherapy.   She states is becoming more and more difficult to fly to [facility] to see her physicians at [facility] [place]  Length of Stay: 2  Current Medications: Scheduled Meds:  . ALPRAZolam  0.5 mg Oral TID  . dexamethasone  6 mg Oral Daily  . enoxaparin (LOVENOX) injection  50 mg Subcutaneous Q12H  . fentaNYL  50 mcg Transdermal Q72H  . methadone  5 mg Oral BID  . pantoprazole  40 mg Oral Daily  . sodium chloride flush  3 mL Intravenous  Q12H    Continuous Infusions: . sodium chloride      PRN Meds: sodium chloride, acetaminophen **OR** [DISCONTINUED] acetaminophen, ALPRAZolam, HYDROmorphone (DILAUDID) injection, ketorolac, naLOXone (NARCAN)  injection, ondansetron **OR** ondansetron (ZOFRAN) IV, oxyCODONE, sodium chloride flush  Physical Exam  Constitutional: She is oriented to person, place, and time.  Cachectic, older female; she appears uncomfortable, in pain  HENT:  Head: Normocephalic.  Facial edema  Eyes:  Periorbital edema  Neck: Normal range of motion.  Cardiovascular: Normal rate.  Pulmonary/Chest: Effort normal.  Musculoskeletal: Normal range of motion.  Neurological: She is alert and oriented to person, place, and time.  Skin: Skin is warm and dry.  Psychiatric: Her behavior is normal. Thought content normal.  She is tearful Mood depressed but no suicidal or homicidal ideation  Nursing note and vitals reviewed.           Vital Signs: BP (!) 151/96 (BP Location: Right Arm)   Pulse 99   Temp 97.7 F (36.5 C) (Oral)   Resp 15   Ht 5' 5" (1.651 m)   Wt 46.2 kg (101 lb 12.8 oz)   SpO2 95%   BMI 16.94 kg/m  SpO2: SpO2: 95 % O2 Device: O2 Device: Room Air O2 Flow Rate:    Intake/output summary:   Intake/Output Summary (Last 24 hours) at 12/29/2017 1144 Last data filed at 12/29/2017 0843 Gross per 24 hour  Intake 60 ml  Output 400 ml  Net -340 ml   LBM: Last BM Date: (pt states some time ago) Baseline Weight: Weight: 47.2 kg (104 lb) Most recent weight:  Weight: 46.2 kg (101 lb 12.8 oz)       Palliative Assessment/Data:    Flowsheet Rows     Most Recent Value  Intake Tab  Referral Department  Hospitalist  Unit at Time of Referral  Intermediate Care Unit  Palliative Care Primary Diagnosis  Cancer  Date Notified  12/27/17  Palliative Care Type  Return patient Palliative Care  Reason for referral  Pain, Clarify Goals of Care  Date of Admission  12/27/17  Date first seen by Palliative Care  12/28/17  # of days Palliative referral response time  1 Day(s)  # of days IP prior to Palliative referral  0  Clinical Assessment  Palliative Performance Scale Score  40%  Pain Max last 24 hours  10  Pain Min Last 24 hours  10  Dyspnea Max Last 24 Hours  0  Dyspnea Min Last 24 hours  0  Nausea Max Last 24 Hours  0  Nausea Min Last 24 Hours  0  Anxiety Max Last 24 Hours  7  Anxiety Min Last 24 Hours  7  Other Max Last 24 Hours  0  Psychosocial & Spiritual Assessment  Palliative Care Outcomes  Patient/Family meeting held?  Yes  Who was at the meeting?  pt and husband  Palliative Care Outcomes  Improved pain interventions, Improved non-pain symptom therapy, Provided psychosocial or spiritual support  Palliative Care follow-up planned  Yes, Facility      Patient Active Problem List   Diagnosis Date Noted  . Palliative care by specialist   . Facial edema 12/27/2017  . 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 Cancer related pain 10/04/2017  . Bilateral hydronephrosis 10/01/2017  . Oral thrush 06/24/2017  . Abdominal pain 03/11/2017  . Goals of care, counseling/discussion 02/22/2017  . Arteriosclerosis 01/31/2017  . Genetic testing 01/10/2017  . Leukopenia due to antineoplastic chemotherapy ([facility]) 12/20/2016  . Cancer associated  pain 11/21/2016  . Weight loss 11/08/2016  . Drug-induced constipation 11/08/2016  . Endometrial cancer ([facility]) 11/07/2016    Palliative Care Assessment & Plan   Patient Profile: 51 y.o. female  with past medical history  of endometrial cancer with mets to liver and extensive intraabdominal mets, bilat hydronephrosis with stents admitted on 12/27/2017 with facial edema and abd pain. Per ED notes, initial impression was possible allergic reaction and she was given solumedrol. Imaging revealed SVT. Vascular, Dr. Oneida Alar, was consulted and she was started on heparin.  Consult ordered for [place]. Palliative medicine service has seen Ms. Gersten 5/19 for pain mgt. Unfortunately during this admission, pain has also been a focus of our assessment.  Patient clearly needs further goals of care discussion which I hope can be obtained once she is more comfortable  Assessment: Patient still is endorsing severe pain rate rated 8 to a 10 on a scale of 10.  I am reluctant to stop her hydromorphone dose IV today.  We are going to uptitrate Dilaudid IV 1 to 2 mg every 2 hours as needed,  Recommendations/Plan:  I am reluctant to remove IV Dilaudid today because she still is endorsing significant pain.  We will uptitrate Dilaudid to 1 to 2 mg every 2 hours as needed, uptitrate oxycodone p.o. 15 mg every 3 hours as needed.  Patient would be safe to have her fentanyl patch uptitrated on 12/30/2016 based on prior 24-hour total daily dose of breakthrough medicine.  I also started her on Decadron 6 mg daily for anti-inflammatory effect since Toradol works so well for her.  I also gave her the option today of 1 PRN Toradol injection.  Start scheduled xanax as I believe anxiety is playing a big part of her pain presentation especially as her illness has advanced  Goals of Care and Additional Recommendations:  Limitations on Scope of Treatment: Full Scope Treatment  Code Status:    Code Status Orders  (From admission, onward)        Start     Ordered   12/27/17 2144  Full code  Continuous     12/27/17 2144    Code Status History    Date Active Date Inactive Code Status Order ID Comments User Context   10/04/2017 0044 10/08/2017 1511 Full  Code [id number]  Cristy Folks, MD ED   04/06/2015 0152 04/09/2015 2123 Full Code [id number]  Mora Bellman, MD ED    Advance Directive Documentation     Most Recent Value  Type of Advance Directive  Healthcare Power of Attorney  Pre-existing out of facility DNR order (yellow form or pink MOST form)  -  "MOST" Form in Place?  -       Prognosis:   < 6 months in the setting of metastatic endometrial cancer now with SVC. Pt would qualify for her hospice benefit if this is in keeping with her goals. She would not be able to elect hospice however if she continues with palliative chemo   Discharge Planning:  Home with follow-up of [facility] in [place]    Thank you for allowing the Palliative Medicine Team to assist in the care of this patient.   Time In: 0900 Time Out: 0945 Total Time 45 min Prolonged Time Billed  no       Greater than 50%  of this time was spent counseling and coordinating care related to the above assessment and plan.  Dory Horn, NP  Please contact Palliative  Medicine Team phone at [phone] for questions and concerns.

## 2017-12-30 DIAGNOSIS — G893 Neoplasm related pain (acute) (chronic): Secondary | ICD-10-CM

## 2017-12-30 DIAGNOSIS — I871 Compression of vein: Principal | ICD-10-CM

## 2017-12-30 DIAGNOSIS — N1330 Unspecified hydronephrosis: Secondary | ICD-10-CM

## 2017-12-30 DIAGNOSIS — R1030 Lower abdominal pain, unspecified: Secondary | ICD-10-CM

## 2017-12-30 DIAGNOSIS — C541 Malignant neoplasm of endometrium: Secondary | ICD-10-CM

## 2017-12-30 DIAGNOSIS — K5903 Drug induced constipation: Secondary | ICD-10-CM

## 2017-12-30 DIAGNOSIS — R220 Localized swelling, mass and lump, head: Secondary | ICD-10-CM

## 2017-12-30 LAB — BASIC METABOLIC PANEL
Anion gap: 9 (ref 5–15)
BUN: 15 mg/dL (ref 6–20)
CALCIUM: 8.7 mg/dL — AB (ref 8.9–10.3)
CO2: 25 mmol/L (ref 22–32)
CREATININE: 0.77 mg/dL (ref 0.44–1.00)
Chloride: 99 mmol/L (ref 98–111)
GFR calc non Af Amer: >60 mL/min (ref >60)
Glucose, Bld: 89 mg/dL (ref 70–99)
Potassium: 3.6 mmol/L (ref 3.5–5.1)
SODIUM: 133 mmol/L — AB (ref 135–145)

## 2017-12-30 LAB — CBC
HCT: 30.2 % — ABNORMAL LOW (ref 36.0–46.0)
Hemoglobin: 10.2 g/dL — ABNORMAL LOW (ref 12.0–15.0)
MCH: 31.3 pg (ref 26.0–34.0)
MCHC: 33.8 g/dL (ref 30.0–36.0)
MCV: 92.6 fL (ref 78.0–100.0)
PLATELETS: 69 10*3/uL — AB (ref 150–400)
RBC: 3.26 MIL/uL — ABNORMAL LOW (ref 3.87–5.11)
RDW: 13.8 % (ref 11.5–15.5)
WBC: 1.4 10*3/uL — CL (ref 4.0–10.5)

## 2017-12-30 NOTE — Progress Notes (Signed)
Daily Progress Note   Patient Name: Kerry Morris       Date: 12/30/2017 DOB: 05-27-1966  Age: 52 y.o. MRN#: [id number] Attending Physician: Hosie Poisson, MD Primary Care Physician: System, Pcp Not In Admit Date: 12/27/2017  Reason for Consultation/Follow-up: Establishing goals of care, Non pain symptom management, Pain control and Psychosocial/spiritual support  Subjective/GOC: Patient awake, alert, oriented. Appears comfortable in bed. Kerry Morris tells me her pain is much better controlled and she was able to rest last night. At this time, she tells me her pain is a "zero."   Reviewed symptom management medication regimen. Kerry Morris feels the adjustments in her medications are making a difference. She does not think changes need to be made today. She is hopeful to get out of bed this afternoon once family is here and shower. Appetite has been fair. She has had multiple BM's since relistor was given.  Kerry Morris verbalizes her plan to continue with chemotherapy at [facility] in [place]. Her next chemotherapy is August 20th. She understands her cancer is not curable and chemotherapy is palliative in nature. She speaks highly of the support she receives from care team in [place]. She states "I am a fighter" and wishes to continue to fight as long as she has support from her husband and family. She shares that she wishes everyone in [place] would stop talking about "hospice" and she is not ready for hospice services at this time. Care team in [place] is encouraging continued palliative treatment.   Therapeutic listening as Kerry Morris shares her journey with cancer with me. Emotional/spiritual support provided. Answered questions and concerns. PMT contact information given.   Length of Stay: 3  Current  Medications: Scheduled Meds:  . ALPRAZolam  0.5 mg Oral TID  . dexamethasone  6 mg Oral Daily  . enoxaparin (LOVENOX) injection  50 mg Subcutaneous Q12H  . fentaNYL  50 mcg Transdermal Q72H  . methadone  5 mg Oral BID  . pantoprazole  40 mg Oral Daily  . sodium chloride flush  3 mL Intravenous Q12H    Continuous Infusions: . sodium chloride      PRN Meds: sodium chloride, acetaminophen **OR** [DISCONTINUED] acetaminophen, ALPRAZolam, HYDROmorphone (DILAUDID) injection, naLOXone (NARCAN)  injection, ondansetron **OR** ondansetron (ZOFRAN) IV, oxyCODONE, sodium chloride flush  Physical Exam  Constitutional: She is oriented to  person, place, and time. She is cooperative.  Comfortable  HENT:  Head: Normocephalic.  Facial edema  Eyes:  Periorbital edema  Neck: Normal range of motion.  Cardiovascular: Normal rate.  Pulmonary/Chest: Effort normal. No accessory muscle usage. No tachypnea. No respiratory distress.  Musculoskeletal: Normal range of motion.  Neurological: She is alert and oriented to person, place, and time.  Skin: Skin is warm and dry.  Psychiatric: She has a normal mood and affect. Her speech is normal and behavior is normal. Thought content normal. Cognition and memory are normal.  S  Nursing note and vitals reviewed.           Vital Signs: BP (!) 150/97 (BP Location: Right Arm)   Pulse 87   Temp 98.5 F (36.9 C) (Oral)   Resp 14   Ht 5\' 5"  (1.651 m)   Wt 46.2 kg (101 lb 12.8 oz)   SpO2 99%   BMI 16.94 kg/m  SpO2: SpO2: 99 % O2 Device: O2 Device: Room Air O2 Flow Rate:    Intake/output summary:  No intake or output data in the 24 hours ending 12/30/17 1144 LBM: Last BM Date: 12/29/17 Baseline Weight: Weight: 47.2 kg (104 lb) Most recent weight: Weight: 46.2 kg (101 lb 12.8 oz)       Palliative Assessment/Data: PPS 40%    Flowsheet Rows     Most Recent Value  Intake Tab  Referral Department  Hospitalist  Unit at Time of Referral  Intermediate  Care Unit  Palliative Care Primary Diagnosis  Cancer  Date Notified  12/27/17  Palliative Care Type  Return patient Palliative Care  Reason for referral  Pain, Clarify Goals of Care  Date of Admission  12/27/17  Date first seen by Palliative Care  12/28/17  # of days Palliative referral response time  1 Day(s)  # of days IP prior to Palliative referral  0  Clinical Assessment  Palliative Performance Scale Score  40%  Pain Max last 24 hours  10  Pain Min Last 24 hours  10  Dyspnea Max Last 24 Hours  0  Dyspnea Min Last 24 hours  0  Nausea Max Last 24 Hours  0  Nausea Min Last 24 Hours  0  Anxiety Max Last 24 Hours  7  Anxiety Min Last 24 Hours  7  Other Max Last 24 Hours  0  Psychosocial & Spiritual Assessment  Palliative Care Outcomes  Patient/Family meeting held?  Yes  Who was at the meeting?  pt and husband  Palliative Care Outcomes  Improved pain interventions, Improved non-pain symptom therapy, Provided psychosocial or spiritual support  Palliative Care follow-up planned  Yes, Facility      Patient Active Problem List   Diagnosis Date Noted  . Anxiety state   . Palliative care by specialist   . Facial edema 12/27/2017  . 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 Cancer related pain 10/04/2017  . Bilateral hydronephrosis 10/01/2017  . Oral thrush 06/24/2017  . Abdominal pain 03/11/2017  . Goals of care, counseling/discussion 02/22/2017  . Arteriosclerosis 01/31/2017  . Genetic testing 01/10/2017  . Leukopenia due to antineoplastic chemotherapy ([place]) 12/20/2016  . Cancer associated pain 11/21/2016  . Weight loss 11/08/2016  . Drug-induced constipation 11/08/2016  . Endometrial cancer ([place]) 11/07/2016    Palliative Care Assessment & Plan   Patient Profile: 52 y.o. female  with past medical history of endometrial cancer with mets to liver and extensive intraabdominal mets, bilat hydronephrosis with stents admitted on 12/27/2017  with facial edema and abd pain. Per ED notes, initial  impression was possible allergic reaction and she was given solumedrol. Imaging revealed SVT. Vascular, Dr. Oneida Alar, was consulted and she was started on heparin. Per Dr. Oneida Alar, either port should be removed or patient requires life-long lovenox. Patient has chosen to keep port and start lovenox injections. Palliative medicine consultation for goals of care/symptom management.   Assessment: Widely metastatic endometrial cancer Abdominal pain SVC syndrome Bilateral hydronephrosis s/p bilateral uretal stents Chronic cancer-related pain  Recommendations/Plan:  Pain/anxiety controlled and patient is feeling much better this morning.   Continue current medication regimen.  Methadone 5mg  PO BID  Fentanyl 77mcg TD q72h  Oxycodone 15mg  PO q3h prn breakthrough pain  Will continue Dilaudid IV prn. Encouraged patient to use oxycodone today, understanding she cannot be discharged on IV medications.   Xanax 0.5mg  PO TID anxiety  Decadron 6mg  PO daily   Per patient, multiple, loose stools after given relistor. Will eventually need to re-start home bowel regimen.  Patient is hopeful to continue palliative chemotherapy with [facility]. Her next chemo is scheduled for August 20th.   She is not ready for hospice services and speaks of her frustration with multiple members of [facility] care team recommending hospice. May benefit from outpatient palliative referral.   PMT will follow inpatient.   Goals of Care and Additional Recommendations:  Limitations on Scope of Treatment: Full Scope Treatment  Code Status:    Code Status Orders  (From admission, onward)        Start     Ordered   12/27/17 2144  Full code  Continuous     12/27/17 2144    Code Status History    Date Active Date Inactive Code Status Order ID Comments User Context   10/04/2017 0044 10/08/2017 1511 Full Code [id number]  Cristy Folks, MD ED   04/06/2015 0152 04/09/2015 2123 Full Code [id number]   Mora Bellman, MD ED    Advance Directive Documentation     Most Recent Value  Type of Advance Directive  Healthcare Power of Attorney  Pre-existing out of facility DNR order (yellow form or pink MOST form)  -  "MOST" Form in Place?  -       Prognosis:   Poor prognosis in the setting of metastatic endometrial cancer now with SVC.   Discharge Planning:  Home with follow-up of [facility] in [place].   Thank you for allowing the Palliative Medicine Team to assist in the care of this patient.   Time In: 1100 Time Out: 1140 Total Time 69min Prolonged Time Billed  no       Greater than 50%  of this time was spent counseling and coordinating care related to the above assessment and plan.  Ihor Dow, FNP-C Palliative Medicine Team  Phone: [phone] Fax: [phone]  Please contact Palliative Medicine Team phone at [phone] for questions and concerns.

## 2017-12-30 NOTE — Progress Notes (Signed)
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  Two days prior to admission she received a dose ofcarboplatin/paxitocel as palliative chemotherapy.     Assessment & Plan:   Active Problems:   Endometrial cancer ([place])   Drug-induced constipation   Cancer associated pain   Abdominal pain   Bilateral hydronephrosis   Facial edema   SVC syndrome   Palliative care by specialist   Anxiety state   SVC syndrome with right porta cath.  She denies any chest pain or sob or headache.  She is on lovenox sq as per VVS recommendations.   Metastatic endometrial carcinoma undergoing palliative chemotherapy at [facility] [place].  Abdominal pain, nausea, vomiting resolved.     Bilateral hydronephrosis with bilateral ureteral stents - renal parameters stable.   Constipation:  Improved.   Pancytopenia: Wbc count and platelet count continue to drop.  Hemoglobin stable at 10.       DVT prophylaxis: lovenox.  Code Status:  Full code.  Family Communication:none at bedside.  Disposition Plan: Home tomorrow.    Consultants:   VVS  Palliative care consult.    Procedures: none.    Antimicrobials: none.    Subjective: No pain, no chest pain or sob. o nausea, or vomiting.   Objective: Vitals:   12/29/17 1959 12/29/17 2000 12/30/17 0456 12/30/17 1248  BP: (!) 156/101 (!) 156/101 (!) 150/97   Pulse: 90 86 87 98  Resp: 18 17 14 19   Temp: 97.9 F (36.6 C)  98.5 F (36.9 C) 97.7 F (36.5 C)  TempSrc: Oral  Oral Oral  SpO2: 98% 97% 99% 99%  Weight:      Height:       No intake or  output data in the 24 hours ending 12/30/17 1422 Filed Weights   12/27/17 1207 12/27/17 2152  Weight: 47.2 kg (104 lb) 46.2 kg (101 lb 12.8 oz)    Examination:  General exam: Appears calm and comfortable  Respiratory system: Clear to auscultation. Respiratory effort normal. Cardiovascular system: S1 & S2 heard, RRR. Marland Kitchen No pedal edema. Gastrointestinal system: Abdomen is nondistended, soft and nontender. No organomegaly or masses felt. Normal bowel sounds heard. Central nervous system: Alert and oriented. No focal neurological deficits. Extremities: Symmetric 5 x 5 power. Psychiatry: Mood & affect appropriate.     Data Reviewed: I have personally reviewed following labs and imaging studies  CBC: Recent Labs  Lab 12/27/17 1357 12/28/17 0414 12/29/17 0243 12/30/17 0713  WBC 5.3 2.9* 1.7* 1.4*  NEUTROABS 4.3  --   --   --   HGB 11.8* 11.2* 10.5* 10.2*  HCT 36.8 34.2* 32.2* 30.2*  MCV 95.6 93.2 93.6 92.6  PLT 118* 85* 94* 69*   Basic Metabolic Panel: Recent Labs  Lab 12/27/17 1357 12/28/17 0414 12/29/17 0243 12/30/17 0713  NA 134* 134* 133* 133*  K 3.7 4.3 3.9 3.6  CL 98 99 100 99  CO2 24 25 24 25   GLUCOSE 94 105* 110* 89  BUN 9 12 21* 15  CREATININE 0.79 0.86 0.83 0.77  CALCIUM 8.7* 8.9 8.3* 8.7*  MG  --  1.7  --   --  PHOS  --  3.9  --   --    GFR: Estimated Creatinine Clearance: 60 mL/min (by C-G formula based on SCr of 0.77 mg/dL). Liver Function Tests: Recent Labs  Lab 12/27/17 1357 12/28/17 0414 12/29/17 0243  AST 32 28 27  ALT 17 18 15   ALKPHOS 85 79 68  BILITOT 0.7 1.0 0.7  PROT 6.8 6.7 6.3*  ALBUMIN 3.2* 3.2* 3.0*   Recent Labs  Lab 12/27/17 1357  LIPASE 30   No results for input(s): AMMONIA in the last 168 hours. Coagulation Profile: No results for input(s): INR, PROTIME in the last 168 hours. Cardiac Enzymes: No results for input(s): CKTOTAL, CKMB, CKMBINDEX, TROPONINI in the last 168 hours. BNP (last 3 results) No results for  input(s): PROBNP in the last 8760 hours. HbA1C: No results for input(s): HGBA1C in the last 72 hours. CBG: No results for input(s): GLUCAP in the last 168 hours. Lipid Profile: No results for input(s): CHOL, HDL, LDLCALC, TRIG, CHOLHDL, LDLDIRECT in the last 72 hours. Thyroid Function Tests: No results for input(s): TSH, T4TOTAL, FREET4, T3FREE, THYROIDAB in the last 72 hours. Anemia Panel: No results for input(s): VITAMINB12, FOLATE, FERRITIN, TIBC, IRON, RETICCTPCT in the last 72 hours. Sepsis Labs: No results for input(s): PROCALCITON, LATICACIDVEN in the last 168 hours.  No results found for this or any previous visit (from the past 240 hour(s)).       Radiology Studies: No results found.      Scheduled Meds: . ALPRAZolam  0.5 mg Oral TID  . dexamethasone  6 mg Oral Daily  . enoxaparin (LOVENOX) injection  50 mg Subcutaneous Q12H  . fentaNYL  50 mcg Transdermal Q72H  . methadone  5 mg Oral BID  . pantoprazole  40 mg Oral Daily  . sodium chloride flush  3 mL Intravenous Q12H   Continuous Infusions: . sodium chloride       LOS: 3 days    Time spent: 35 minutes     Hosie Poisson, MD [facility] Pager [phone]  If 7PM-7AM, please contact night-coverage [email or web address] Password [email or web address] 12/30/2017, 2:22 PM

## 2017-12-30 NOTE — Progress Notes (Addendum)
11:15 CM following, available to assist in discharge and home hospice planning as needed.  14:00 Discussed case with Jinny Blossom NP Palliative. Patient is not interested in hospice approach. Discussed palliative follow up, specifically pain medication management with patient. She states that her titrated doses from over the weekend are much more effective and she is comfortable. She states that she understood she would be sent home w scripts for adjusted medications at [facility]. She has palliative services through [facility] and plans to continue to use them. She explains that she signed on to using their providers and cannot use local Palliative providers as it opens the door to being able to get too many prescriptions for narcotics and confusion with her care teams. CM voiced understanding. Patient was appreciative of time spent discussing her plan. No CM needs identified at this time.  Patient requests to please ensure Oncology team in [place] receives records from this admission.

## 2017-12-31 LAB — CBC
HEMATOCRIT: 30.6 % — AB (ref 36.0–46.0)
Hemoglobin: 9.8 g/dL — ABNORMAL LOW (ref 12.0–15.0)
MCH: 30.7 pg (ref 26.0–34.0)
MCHC: 32 g/dL (ref 30.0–36.0)
MCV: 95.9 fL (ref 78.0–100.0)
PLATELETS: 67 10*3/uL — AB (ref 150–400)
RBC: 3.19 MIL/uL — ABNORMAL LOW (ref 3.87–5.11)
RDW: 14 % (ref 11.5–15.5)
WBC: 1.4 10*3/uL — CL (ref 4.0–10.5)

## 2017-12-31 MED ORDER — DEXAMETHASONE 6 MG PO TABS: 6.0000 mg | ORAL_TABLET | Freq: Every day | ORAL | 0 refills | Status: DC

## 2017-12-31 MED ORDER — HEPARIN SOD (PORK) LOCK FLUSH 100 UNIT/ML IV SOLN
500.0000 [IU] | INTRAVENOUS | Status: AC | PRN
  Administered 2017-12-31: 500 [IU]

## 2017-12-31 MED ORDER — PANTOPRAZOLE SODIUM 40 MG PO TBEC: 40.0000 mg | DELAYED_RELEASE_TABLET | Freq: Every day | ORAL | 0 refills | Status: DC

## 2017-12-31 MED ORDER — ALPRAZOLAM 0.5 MG PO TABS: 0.5000 mg | ORAL_TABLET | Freq: Every evening | ORAL | 0 refills | Status: DC | PRN

## 2017-12-31 MED ORDER — ENOXAPARIN SODIUM 60 MG/0.6ML ~~LOC~~ SOLN: 50.0000 mg | Freq: Two times a day (BID) | SUBCUTANEOUS | 0 refills | Status: DC

## 2017-12-31 NOTE — Progress Notes (Signed)
Two 25mg  patches removed from L shoulder and wasted in sharps box with Marcell Anger, RN. Two 25mg  patches dated and placed on R shoulder.  Clyde Canterbury, RN

## 2017-12-31 NOTE — Care Management (Signed)
12-31-17  BENEFITS CHECK:   7.  S/W   LEONARD  @ [employer] # [phone]  1. LOVENOX  50  MG  SYRINGES  COVER- NOT COVER PRIOR APPROVAL-YES  # [phone]  2. ENOXAPARIN  60 MG  SYRINGES   (50 MG-NO ) COVER- YES CO-PAY- ZERO DOLLARS    TIER- 2 DRUG PRIOR APPROVAL- NO

## 2017-12-31 NOTE — Progress Notes (Signed)
D/c instructions and prescriptions given to pt. Port a cath de-accessed by IV team. All questions answered. Friend to escort home.  Clyde Canterbury, RN

## 2017-12-31 NOTE — Discharge Summary (Signed)
Physician Discharge Summary  Kerry Morris MVH:[id number] DOB: [birth year] DOA: 12/27/2017  PCP: System, Pcp Not In  Admit date: 12/27/2017 Discharge date: 12/31/2017  Admitted From: Home.  Disposition:  Home.   Recommendations for Outpatient Follow-up:  1. Follow up with PCP in 1-2 weeks 2. Please obtain BMP/CBC in one week Please follow up with cancer center and oncology regarding her counts.   Discharge Condition:stable.  CODE STATUS: full code.  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 Two days prior to admission she received a dose ofcarboplatin/paxitocelas palliative chemotherapy.    Discharge Diagnoses:  Active Problems:   Endometrial cancer ([place])   Drug-induced constipation   Cancer associated pain   Abdominal pain   Bilateral hydronephrosis   Facial edema   SVC syndrome   Palliative care by specialist   Anxiety state  SVC syndrome with right porta cath.  She denies any chest pain or sob or headache.  She is on lovenox sq therapeutic dose as per VVS recommendations.   Metastatic endometrial carcinoma undergoing palliative chemotherapy at [facility] of [place].  Abdominal pain, nausea, vomiting resolved.     Bilateral hydronephrosis with bilateral ureteral stents - renal parameters stable.   Constipation:  Improved.   Pancytopenia: Wbc count stabilized at 1.4, platelet counts stabilized at 67,000 and hemoglobin between 9 to 10. Recommend checking cbc with differential in 2 to 3 days .  Pancytopenia probably from the recent chemotherapy.  Pt denies any bleeding or weakness at this time.     Discharge Instructions  Discharge  Instructions    Diet - low sodium heart healthy   Complete by:  As directed    Discharge instructions   Complete by:  As directed    Please follow up with your oncology as scheduled.     Allergies as of 12/31/2017      Reactions   Lenvima [lenvatinib] Other (See Comments)   seizure      Medication List    STOP taking these medications   lidocaine-prilocaine cream Commonly known as:  EMLA   ondansetron 4 MG disintegrating tablet Commonly known as:  ZOFRAN ODT   polyethylene glycol packet Commonly known as:  MIRALAX     TAKE these medications   acetaminophen 325 MG tablet Commonly known as:  TYLENOL Take 325 mg by mouth every 6 (six) hours as needed for mild pain.   ALPRAZolam 0.5 MG tablet Commonly known as:  XANAX Take 1 tablet (0.5 mg total) by mouth at bedtime as needed for anxiety or sleep.   AMITIZA 24 MCG capsule Generic drug:  lubiprostone Take 24 mcg by mouth 2 (two) times daily.   celecoxib 200 MG capsule Commonly known as:  CELEBREX Take 200 mg by mouth daily as needed for moderate pain.   dexamethasone 6 MG tablet Commonly known as:  DECADRON Take 1 tablet (6 mg total) by mouth daily. Start taking on:  01/01/2018   enoxaparin 60 MG/0.6ML injection Commonly known as:  LOVENOX Inject 0.5 mLs (50 mg total) into the skin every 12 (twelve) hours.   fentaNYL 25 MCG/HR patch Commonly known as:  DURAGESIC - dosed mcg/hr Place 25 mcg onto the skin every 3 (three) days.   methadone 5 MG tablet  Commonly known as:  DOLOPHINE Take 5 mg by mouth 2 (two) times daily.   OVER THE COUNTER MEDICATION Inject 1 ampule as directed every other day. Viscum Abietis   Oxycodone HCl 10 MG Tabs Take 10 mg by mouth every 4 (four) hours as needed (for pain).   pantoprazole 40 MG tablet Commonly known as:  PROTONIX Take 1 tablet (40 mg total) by mouth daily.   prochlorperazine 10 MG tablet Commonly known as:  COMPAZINE Take 1 tablet (10 mg total) by mouth every 6 (six)  hours as needed for nausea or vomiting.   senna 8.6 MG Tabs tablet Commonly known as:  SENOKOT Take 2 tablets (17.2 mg total) by mouth daily. What changed:  how much to take       Allergies  Allergen Reactions  . Lenvima [Lenvatinib] Other (See Comments)    seizure    Consultations:  Vascular surgery.   Palliative care consult.    Procedures/Studies: Ct Head Wo Contrast  Result Date: 12/16/2017 CLINICAL DATA:  Seizure.  History of metastatic endometrial cancer EXAM: CT HEAD WITHOUT CONTRAST TECHNIQUE: Contiguous axial images were obtained from the base of the skull through the vertex without intravenous contrast. COMPARISON:  None. FINDINGS: Brain: No evidence of acute infarction, hemorrhage, hydrocephalus, extra-axial collection or mass lesion/mass effect. Vascular: Negative for hyperdense vessel Skull: Negative Sinuses/Orbits: Negative Other: None IMPRESSION: Negative CT head Electronically Signed   By: Franchot Gallo M.D.   On: 12/16/2017 11:19   Ct Chest W Contrast  Result Date: 12/27/2017 CLINICAL DATA:  Abdominal pain and cramping with facial swelling. Allergic reaction. Metastatic uterine cancer. EXAM: CT CHEST WITH CONTRAST TECHNIQUE: Multidetector CT imaging of the chest was performed during intravenous contrast administration. CONTRAST:  136mL OMNIPAQUE IOHEXOL 300 MG/ML  SOLN COMPARISON:  Abdominopelvic CT earlier today CT chest, abdomen and pelvis 11/20/2016. FINDINGS: Cardiovascular: Right IJ Port-A-Cath extends to the superior cavoatrial junction. There is limited opacification of the lower SVC (axial images 58 through 75/6) which could reflect thrombus or central venous stenosis. The upper SVC and brachiocephalic veins are patent. The systemic and pulmonary arteries appear normal. The heart size is normal. There is no significant pericardial effusion. Mediastinum/Nodes: There are no enlarged mediastinal, hilar or axillary lymph nodes. There is mild generalized mediastinal  edema. The thyroid gland and trachea appear normal. There is mild distal esophageal wall thickening. Lungs/Pleura: Trace bilateral pleural effusions. Mild emphysema. No airspace disease or suspicious pulmonary nodule. Upper Abdomen: Multifocal hepatic metastatic disease as seen on earlier abdominal CT. Partially imaged bilateral hydronephrosis with retained contrast in the left collecting system and minimal contrast in the right collecting system. Musculoskeletal: No suspicious osseous findings. Generalized soft tissue edema without focal fluid collection. IMPRESSION: 1. No evidence of metastatic disease in the chest. 2. Generalized edema throughout the subcutaneous and mediastinal fat. Small bilateral pleural effusions. 3. Possible thrombus versus stenosis of the lower SVC adjacent to the Port-A-Cath. This could contribute to the patient's facial swelling. 4.  Emphysema (ICD10-J43.9). 5. Hepatic metastatic disease as described on earlier abdominal CT. Bilateral hydronephrosis with retained contrast in the left collecting system. Patient has bilateral ureteral stents. Electronically Signed   By: Richardean Sale M.D.   On: 12/27/2017 19:28   Ct Abdomen Pelvis W Contrast  Result Date: 12/27/2017 CLINICAL DATA:  Abdominal pain, history of uterine carcinoma EXAM: CT ABDOMEN AND PELVIS WITH CONTRAST TECHNIQUE: Multidetector CT imaging of the abdomen and pelvis was performed using the standard protocol following bolus administration of intravenous  contrast. CONTRAST:  112mL OMNIPAQUE IOHEXOL 300 MG/ML  SOLN COMPARISON:  09/28/2017 FINDINGS: Lower chest: Lung bases are free of acute infiltrate or sizable effusion. Hepatobiliary: Liver demonstrates increase in both size and number of metastatic lesions. The dominant lesion lies within the midportion of the right lobe measuring approximately 3.6 cm in greatest dimension. This is an increase from 16 mm on the prior exam. Stable hepatic cyst is noted within the medial  segment of the left lobe of the liver. Gallbladder is well distended. Some non dependent foci of air are noted likely related to small gallstones. No pericholecystic fluid is noted. Pancreas: Small calcification is again seen in the pancreatic head. Pancreas is otherwise within normal limits. Spleen: Normal in size without focal abnormality. Adrenals/Urinary Tract: Adrenal glands are within normal limits. Bilateral hydronephrotic changes are again identified and stable. Bilateral ureteral stents are seen in satisfactory position. The degree of dilatation is likely related to reflux from the bladder. Bladder is partially distended and demonstrates significant wall thickening along its right lateral aspect and posterior wall as well as superiorly. These changes would be consistent with metastatic invasion from the adjacent pelvic mass. Stomach/Bowel: Stomach is decompressed. No colonic obstructive changes are seen. The distal sigmoid/rectosigmoid region is surrounded by metastatic disease similar to that noted on the prior exam. No resultant obstructive changes are seen. Vascular/Lymphatic: Vascular calcifications are noted without aneurysmal dilatation. Considerable retroperitoneal lymphadenopathy is noted in the periaortic, pericaval and intra-aortocaval region. Index lymph node in the periaortic region on image number 34 of series 3 now measures 14 mm in short axis and previously measured 11 mm in short axis. The intra-aortocaval node at a similar level now measures almost 16 mm in short axis. It previously measured 8 mm in short axis. New right inguinal adenopathy is noted measuring 13 mm in short axis. Reproductive: The uterus and ovaries have been removed consistent with the given clinical history. In the pelvis in the expected region of the uterus there are again peritoneal implants identified. Central decreased attenuation is noted consistent with a degree of central necrosis. The previously seen separate  implants now appear to represent 1 confluent mass extending superiorly along the anterior aspect of the sacrum. There is also apparent local invasion into the bladder wall as previously described. The overall dimension of the pelvic mass measures approximately 7.5 cm in greatest transverse dimension increased from approximately 6.6 cm in greatest transverse dimension. Increased bulk is noted as well. Given the relative circumferential encasement of the rectosigmoid the possibility of local invasion into the bowel wall cannot be totally excluded. No perforation is identified at this time. Other: No significant ascites is seen.  No hernia is identified. Musculoskeletal: Bony structures show no lytic or sclerotic foci to suggest metastatic disease. No compression deformities are seen. IMPRESSION: Significant progression of metastatic disease. Increase in both size and number of hepatic metastatic lesions is seen. There is now identified local invasion involving the bladder wall particularly within the posterior and right bladder wall. The peritoneal implants seen previously now all represent 1 large confluent mass which has encircled the rectosigmoid. Localized invasion into the bowel wall deserves consideration. There is also significant increase in retroperitoneal adenopathy. Bilateral ureteral stents with persistent hydronephrosis and hydroureter likely related to reflux from the bladder. Changes suggestive of gas containing gallstones. No pericholecystic fluid is identified. Electronically Signed   By: Inez Catalina M.D.   On: 12/27/2017 16:39   Dg Abd Portable 2 Views  Result Date: 12/16/2017  CLINICAL DATA:  Abdominal pain EXAM: PORTABLE ABDOMEN - 2 VIEW COMPARISON:  10/03/2017 FINDINGS: Scattered large and small bowel gas is noted. Mild retained fecal material is seen without obstructive change. Bilateral ureteral stents are noted in satisfactory position. No bony abnormality is seen. No free air is seen.  IMPRESSION: Mild retained fecal material.  No other is noted. Electronically Signed   By: Inez Catalina M.D.   On: 12/16/2017 16:12        Subjective: No chest pain or sob, nausea or vomiting .   Discharge Exam: Vitals:   12/31/17 0441 12/31/17 0743  BP: (!) 154/90 135/83  Pulse: 86 83  Resp: 12 19  Temp: 98.8 F (37.1 C) 98.3 F (36.8 C)  SpO2: 96% 96%   Vitals:   12/30/17 2010 12/30/17 2048 12/31/17 0441 12/31/17 0743  BP: (!) 159/84  (!) 154/90 135/83  Pulse:  77 86 83  Resp: 14 15 12 19   Temp: 98.2 F (36.8 C)  98.8 F (37.1 C) 98.3 F (36.8 C)  TempSrc: Oral  Oral Oral  SpO2:  99% 96% 96%  Weight:      Height:        General: Pt is alert, awake, not in acute distress Cardiovascular: RRR, S1/S2 +, no rubs, no gallops Respiratory: CTA bilaterally, no wheezing, no rhonchi Abdominal: Soft, NT, ND, bowel sounds + Extremities: no edema, no cyanosis    The results of significant diagnostics from this hospitalization (including imaging, microbiology, ancillary and laboratory) are listed below for reference.     Microbiology: No results found for this or any previous visit (from the past 240 hour(s)).   Labs: BNP (last 3 results) No results for input(s): BNP in the last 8760 hours. Basic Metabolic Panel: Recent Labs  Lab 12/27/17 1357 12/28/17 0414 12/29/17 0243 12/30/17 0713  NA 134* 134* 133* 133*  K 3.7 4.3 3.9 3.6  CL 98 99 100 99  CO2 24 25 24 25   GLUCOSE 94 105* 110* 89  BUN 9 12 21* 15  CREATININE 0.79 0.86 0.83 0.77  CALCIUM 8.7* 8.9 8.3* 8.7*  MG  --  1.7  --   --   PHOS  --  3.9  --   --    Liver Function Tests: Recent Labs  Lab 12/27/17 1357 12/28/17 0414 12/29/17 0243  AST 32 28 27  ALT 17 18 15   ALKPHOS 85 79 68  BILITOT 0.7 1.0 0.7  PROT 6.8 6.7 6.3*  ALBUMIN 3.2* 3.2* 3.0*   Recent Labs  Lab 12/27/17 1357  LIPASE 30   No results for input(s): AMMONIA in the last 168 hours. CBC: Recent Labs  Lab 12/27/17 1357  12/28/17 0414 12/29/17 0243 12/30/17 0713 12/31/17 0621  WBC 5.3 2.9* 1.7* 1.4* 1.4*  NEUTROABS 4.3  --   --   --   --   HGB 11.8* 11.2* 10.5* 10.2* 9.8*  HCT 36.8 34.2* 32.2* 30.2* 30.6*  MCV 95.6 93.2 93.6 92.6 95.9  PLT 118* 85* 94* 69* 67*   Cardiac Enzymes: No results for input(s): CKTOTAL, CKMB, CKMBINDEX, TROPONINI in the last 168 hours. BNP: Invalid input(s): POCBNP CBG: No results for input(s): GLUCAP in the last 168 hours. D-Dimer No results for input(s): DDIMER in the last 72 hours. Hgb A1c No results for input(s): HGBA1C in the last 72 hours. Lipid Profile No results for input(s): CHOL, HDL, LDLCALC, TRIG, CHOLHDL, LDLDIRECT in the last 72 hours. Thyroid function studies No results for input(s): TSH,  T4TOTAL, T3FREE, THYROIDAB in the last 72 hours.  Invalid input(s): FREET3 Anemia work up No results for input(s): VITAMINB12, FOLATE, FERRITIN, TIBC, IRON, RETICCTPCT in the last 72 hours. Urinalysis    Component Value Date/Time   COLORURINE YELLOW 12/27/2017 1357   APPEARANCEUR CLEAR 12/27/2017 1357   LABSPEC 1.009 12/27/2017 1357   LABSPEC 1.005 03/12/2017 1011   PHURINE 7.0 12/27/2017 1357   GLUCOSEU NEGATIVE 12/27/2017 1357   GLUCOSEU Negative 03/12/2017 1011   HGBUR MODERATE (A) 12/27/2017 1357   BILIRUBINUR NEGATIVE 12/27/2017 1357   BILIRUBINUR Negative 03/12/2017 [place] 12/27/2017 1357   PROTEINUR 30 (A) 12/27/2017 1357   UROBILINOGEN 0.2 03/12/2017 1011   NITRITE NEGATIVE 12/27/2017 1357   LEUKOCYTESUR TRACE (A) 12/27/2017 1357   LEUKOCYTESUR Trace 03/12/2017 1011   Sepsis Labs Invalid input(s): PROCALCITONIN,  WBC,  LACTICIDVEN Microbiology No results found for this or any previous visit (from the past 240 hour(s)).   Time coordinating discharge: 32 minutes  SIGNED:   Hosie Poisson, MD  [facility] 12/31/2017, 6:25 PM Pager   If 7PM-7AM, please contact night-coverage [email or web address] Password [email or web address]

## 2017-12-31 NOTE — Progress Notes (Signed)
Daily Progress Note   Patient Name: Kerry Morris       Date: 12/31/2017 DOB: 1965/12/06  Age: 52 y.o. MRN#: [id number] Attending Physician: Hosie Poisson, MD Primary Care Physician: System, Pcp Not In Admit Date: 12/27/2017  Reason for Consultation/Follow-up: Establishing goals of care, Non pain symptom management, Pain control and Psychosocial/spiritual support  Subjective/GOC: Upon arrival to room, patient ambulating and preparing bags for home. She tells me she is discharged and eager to get home. Kerry Morris tells me she took frequent prn medications through the night to stay on top of her pain control (in fear of pain crisis again). She denies pain this afternoon and is feeling better.   The [facility] [place] [facility] management clinic manages her medications. Patient agreeable with me reaching out to pain management clinic so they have a good understanding of hospitalization and new symptom management regimen. Patient tells me she can readily contact pain management clinic for refill and in crisis.   Pain shares that she is hopeful to continue to "kick cancer's ass." She is realistic in regards to terminal prognosis but wishes to continue fighting and receiving chemotherapy as long as she can. 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 Emotional support provided.   Length of Stay: 4  Current Medications: Scheduled Meds:  . ALPRAZolam  0.5 mg Oral TID  . dexamethasone  6 mg Oral Daily  . enoxaparin (LOVENOX) injection  50 mg Subcutaneous Q12H  . fentaNYL  50 mcg Transdermal Q72H  . methadone  5 mg Oral BID  . pantoprazole  40 mg Oral Daily  . sodium chloride flush  3 mL Intravenous Q12H    Continuous  Infusions: . sodium chloride      PRN Meds: sodium chloride, acetaminophen **OR** [DISCONTINUED] acetaminophen, ALPRAZolam, HYDROmorphone (DILAUDID) injection, naLOXone (NARCAN)  injection, ondansetron **OR** ondansetron (ZOFRAN) IV, oxyCODONE, sodium chloride flush  Physical Exam  Constitutional: She is oriented to person, place, and time. She is cooperative.  Comfortable  HENT:  Head: Normocephalic.  Facial edema  Eyes:  Periorbital edema  Neck: Normal range of motion.  Cardiovascular: Normal rate.  Pulmonary/Chest: Effort normal. No accessory muscle usage. No tachypnea. No respiratory distress.  Musculoskeletal: Normal range of motion.  Neurological: She is alert and  oriented to person, place, and time.  Skin: Skin is warm and dry.  Psychiatric: She has a normal mood and affect. Her speech is normal and behavior is normal. Thought content normal. Cognition and memory are normal.  S  Nursing note and vitals reviewed.           Vital Signs: BP 135/83 (BP Location: Right Arm)   Pulse 83   Temp 98.3 F (36.8 C) (Oral)   Resp 19   Ht 5' 5"  (1.651 m)   Wt 46.2 kg (101 lb 12.8 oz)   SpO2 96%   BMI 16.94 kg/m  SpO2: SpO2: 96 % O2 Device: O2 Device: Room Air O2 Flow Rate:    Intake/output summary:   Intake/Output Summary (Last 24 hours) at 12/31/2017 1212 Last data filed at 12/31/2017 0800 Gross per 24 hour  Intake 243 ml  Output -  Net 243 ml   LBM: Last BM Date: 12/29/17 Baseline Weight: Weight: 47.2 kg (104 lb) Most recent weight: Weight: 46.2 kg (101 lb 12.8 oz)       Palliative Assessment/Data: PPS 50%    Flowsheet Rows     Most Recent Value  Intake Tab  Referral Department  Hospitalist  Unit at Time of Referral  Intermediate Care Unit  Palliative Care Primary Diagnosis  Cancer  Date Notified  12/27/17  Palliative Care Type  Return patient Palliative Care  Reason for referral  Pain, Clarify Goals of Care  Date of Admission  12/27/17  Date first seen by  Palliative Care  12/28/17  # of days Palliative referral response time  1 Day(s)  # of days IP prior to Palliative referral  0  Clinical Assessment  Palliative Performance Scale Score  40%  Pain Max last 24 hours  10  Pain Min Last 24 hours  10  Dyspnea Max Last 24 Hours  0  Dyspnea Min Last 24 hours  0  Nausea Max Last 24 Hours  0  Nausea Min Last 24 Hours  0  Anxiety Max Last 24 Hours  7  Anxiety Min Last 24 Hours  7  Other Max Last 24 Hours  0  Psychosocial & Spiritual Assessment  Palliative Care Outcomes  Patient/Family meeting held?  Yes  Who was at the meeting?  pt and husband  Palliative Care Outcomes  Improved pain interventions, Improved non-pain symptom therapy, Provided psychosocial or spiritual support  Palliative Care follow-up planned  Yes, Facility      Patient Active Problem List   Diagnosis Date Noted  . Anxiety state   . Palliative care by specialist   . Facial edema 12/27/2017  . 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 Cancer related pain 10/04/2017  . Bilateral hydronephrosis 10/01/2017  . Oral thrush 06/24/2017  . Abdominal pain 03/11/2017  . Goals of care, counseling/discussion 02/22/2017  . Arteriosclerosis 01/31/2017  . Genetic testing 01/10/2017  . Leukopenia due to antineoplastic chemotherapy ([place]) 12/20/2016  . Cancer associated pain 11/21/2016  . Weight loss 11/08/2016  . Drug-induced constipation 11/08/2016  . Endometrial cancer ([place]) 11/07/2016    Palliative Care Assessment & Plan   Patient Profile: 52 y.o. 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 Per ED notes, initial impression was possible allergic reaction and she was given solumedrol. Imaging revealed SVT. Vascular, Dr. Oneida Alar, was consulted and she was started on heparin. Per Dr. Oneida Alar, either  port should be removed or patient requires life-long  lovenox. Patient has chosen to keep port and start lovenox injections. Palliative medicine consultation for goals of care/symptom management.   Assessment: Widely metastatic endometrial cancer Abdominal pain SVC syndrome Bilateral hydronephrosis s/p bilateral uretal stents Chronic cancer-related pain  Recommendations/Plan:  Pain/anxiety controlled and patient is ready for discharge.   Continue current medication regimen.  Methadone 24m PO BID  Fentanyl 522m TD q72h  Oxycodone 1538mO q3h prn breakthrough pain  Xanax 0.5mg10m TID anxiety  Decadron 6mg 3mdaily   Home bowel regimen when discharged. Received [facility] inpatient.   Patient is hopeful to continue palliative chemotherapy with [facility] next chemo is scheduled for August 20th.   Patient is not ready for outpatient palliative or hospice services.   Voicemail left for [facility] management clinic [facility] will continue to manage her medications. Recommend Narcan Kit for home.   Goals of Care and Additional Recommendations:  Limitations on Scope of Treatment: Full Scope Treatment  Code Status:    Code Status Orders  (From admission, onward)        Start     Ordered   12/27/17 2144  Full code  Continuous     12/27/17 2144    Code Status History    Date Active Date Inactive Code Status Order ID Comments User Context   10/04/2017 0044 10/08/2017 1511 Full Code [id number] FolksED   04/06/2015 0152 04/09/2015 2123 Full Code [id number] BellmanED    Advance Directive Documentation     Most Recent Value  Type of Advance Directive  Healthcare Power of Attorney  Pre-existing out of facility DNR order (yellow form or pink MOST form)  -  "MOST" Form in Place?  -       Prognosis:   Poor prognosis in the setting of metastatic endometrial cancer now with SVC.   Discharge Planning:  Home with [facility] for allowing the Palliative Medicine Team to assist in the care of this patient.   Time In: 1130 Time Out: 1150 Total Time 20min92mlonged Time Billed  no       Greater than 50%  of this time was spent counseling and coordinating care related to the above assessment and plan.  Concettina Leth [facility] Palliative Medicine Team  Phone: [phone] contact Palliative Medicine Team phone at [phone] and concerns.

## 2018-02-10 ENCOUNTER — Encounter (HOSPITAL_COMMUNITY): Payer: Self-pay | Admitting: Emergency Medicine

## 2018-02-10 ENCOUNTER — Inpatient Hospital Stay (HOSPITAL_COMMUNITY)
Admission: EM | Admit: 2018-02-10 | Discharge: 2018-02-15 | DRG: 683 | Disposition: A | Discharge status: Hospice | Payer: BLUE CROSS/BLUE SHIELD | Attending: Internal Medicine | Admitting: Internal Medicine

## 2018-02-10 ENCOUNTER — Emergency Department (HOSPITAL_COMMUNITY): Payer: BLUE CROSS/BLUE SHIELD

## 2018-02-10 DIAGNOSIS — N1330 Unspecified hydronephrosis: Secondary | ICD-10-CM | POA: Diagnosis present

## 2018-02-10 DIAGNOSIS — Z888 Allergy status to other drugs, medicaments and biological substances status: Secondary | ICD-10-CM | POA: Diagnosis not present

## 2018-02-10 DIAGNOSIS — Z6820 Body mass index (BMI) 20.0-20.9, adult: Secondary | ICD-10-CM | POA: Diagnosis not present

## 2018-02-10 DIAGNOSIS — C7911 Secondary malignant neoplasm of bladder: Secondary | ICD-10-CM | POA: Diagnosis not present

## 2018-02-10 DIAGNOSIS — Z515 Encounter for palliative care: Secondary | ICD-10-CM | POA: Diagnosis not present

## 2018-02-10 DIAGNOSIS — D649 Anemia, unspecified: Secondary | ICD-10-CM | POA: Diagnosis present

## 2018-02-10 DIAGNOSIS — Z808 Family history of malignant neoplasm of other organs or systems: Secondary | ICD-10-CM | POA: Diagnosis not present

## 2018-02-10 DIAGNOSIS — E871 Hypo-osmolality and hyponatremia: Secondary | ICD-10-CM | POA: Diagnosis present

## 2018-02-10 DIAGNOSIS — Z90710 Acquired absence of both cervix and uterus: Secondary | ICD-10-CM

## 2018-02-10 DIAGNOSIS — E46 Unspecified protein-calorie malnutrition: Secondary | ICD-10-CM | POA: Diagnosis not present

## 2018-02-10 DIAGNOSIS — Z806 Family history of leukemia: Secondary | ICD-10-CM | POA: Diagnosis not present

## 2018-02-10 DIAGNOSIS — Z66 Do not resuscitate: Secondary | ICD-10-CM | POA: Diagnosis present

## 2018-02-10 DIAGNOSIS — C785 Secondary malignant neoplasm of large intestine and rectum: Secondary | ICD-10-CM | POA: Diagnosis not present

## 2018-02-10 DIAGNOSIS — C541 Malignant neoplasm of endometrium: Secondary | ICD-10-CM | POA: Diagnosis present

## 2018-02-10 DIAGNOSIS — F329 Major depressive disorder, single episode, unspecified: Secondary | ICD-10-CM | POA: Diagnosis not present

## 2018-02-10 DIAGNOSIS — Z87891 Personal history of nicotine dependence: Secondary | ICD-10-CM

## 2018-02-10 DIAGNOSIS — N179 Acute kidney failure, unspecified: Secondary | ICD-10-CM | POA: Diagnosis present

## 2018-02-10 DIAGNOSIS — C7900 Secondary malignant neoplasm of unspecified kidney and renal pelvis: Secondary | ICD-10-CM | POA: Diagnosis present

## 2018-02-10 DIAGNOSIS — R64 Cachexia: Secondary | ICD-10-CM | POA: Diagnosis not present

## 2018-02-10 DIAGNOSIS — K219 Gastro-esophageal reflux disease without esophagitis: Secondary | ICD-10-CM | POA: Diagnosis not present

## 2018-02-10 DIAGNOSIS — F411 Generalized anxiety disorder: Secondary | ICD-10-CM | POA: Diagnosis not present

## 2018-02-10 DIAGNOSIS — F419 Anxiety disorder, unspecified: Secondary | ICD-10-CM | POA: Diagnosis present

## 2018-02-10 DIAGNOSIS — N19 Unspecified kidney failure: Secondary | ICD-10-CM

## 2018-02-10 DIAGNOSIS — E875 Hyperkalemia: Secondary | ICD-10-CM | POA: Diagnosis present

## 2018-02-10 DIAGNOSIS — R319 Hematuria, unspecified: Secondary | ICD-10-CM | POA: Diagnosis present

## 2018-02-10 DIAGNOSIS — R634 Abnormal weight loss: Secondary | ICD-10-CM | POA: Diagnosis present

## 2018-02-10 DIAGNOSIS — G893 Neoplasm related pain (acute) (chronic): Secondary | ICD-10-CM | POA: Diagnosis present

## 2018-02-10 DIAGNOSIS — R109 Unspecified abdominal pain: Secondary | ICD-10-CM | POA: Diagnosis present

## 2018-02-10 LAB — CBC WITH DIFFERENTIAL/PLATELET
ABS IMMATURE GRANULOCYTES: 0 10*3/uL (ref 0.0–0.1)
BASOS ABS: 0 10*3/uL (ref 0.0–0.1)
Basophils Relative: 0 %
Eosinophils Absolute: 0 10*3/uL (ref 0.0–0.7)
Eosinophils Relative: 0 %
HCT: 18.1 % — ABNORMAL LOW (ref 36.0–46.0)
HEMOGLOBIN: 5.8 g/dL — AB (ref 12.0–15.0)
Immature Granulocytes: 0 %
LYMPHS PCT: 14 %
Lymphs Abs: 1 10*3/uL (ref 0.7–4.0)
MCH: 30.5 pg (ref 26.0–34.0)
MCHC: 32 g/dL (ref 30.0–36.0)
MCV: 95.3 fL (ref 78.0–100.0)
MONO ABS: 0.7 10*3/uL (ref 0.1–1.0)
MONOS PCT: 10 %
NEUTROS ABS: 5.2 10*3/uL (ref 1.7–7.7)
Neutrophils Relative %: 76 %
Platelets: 294 10*3/uL (ref 150–400)
RBC: 1.9 MIL/uL — ABNORMAL LOW (ref 3.87–5.11)
RDW: 17.7 % — ABNORMAL HIGH (ref 11.5–15.5)
WBC: 7 10*3/uL (ref 4.0–10.5)

## 2018-02-10 LAB — COMPREHENSIVE METABOLIC PANEL
ALT: 12 U/L (ref 0–44)
AST: 17 U/L (ref 15–41)
Albumin: 2.7 g/dL — ABNORMAL LOW (ref 3.5–5.0)
Alkaline Phosphatase: 173 U/L — ABNORMAL HIGH (ref 38–126)
Anion gap: 17 — ABNORMAL HIGH (ref 5–15)
BUN: 73 mg/dL — AB (ref 6–20)
CHLORIDE: 94 mmol/L — AB (ref 98–111)
CO2: 18 mmol/L — AB (ref 22–32)
CREATININE: 7.87 mg/dL — AB (ref 0.44–1.00)
Calcium: 9 mg/dL (ref 8.9–10.3)
GFR calc Af Amer: 6 mL/min — ABNORMAL LOW (ref >60)
GFR, EST NON AFRICAN AMERICAN: 5 mL/min — AB (ref >60)
Glucose, Bld: 97 mg/dL (ref 70–99)
POTASSIUM: 5.9 mmol/L — AB (ref 3.5–5.1)
SODIUM: 129 mmol/L — AB (ref 135–145)
Total Bilirubin: 1.2 mg/dL (ref 0.3–1.2)
Total Protein: 6.8 g/dL (ref 6.5–8.1)

## 2018-02-10 LAB — URINALYSIS, ROUTINE W REFLEX MICROSCOPIC
BILIRUBIN URINE: NEGATIVE
GLUCOSE, UA: NEGATIVE mg/dL
Ketones, ur: NEGATIVE mg/dL
Nitrite: NEGATIVE
Protein, ur: 100 mg/dL — AB
SPECIFIC GRAVITY, URINE: 1.012 (ref 1.005–1.030)
pH: 5 (ref 5.0–8.0)

## 2018-02-10 LAB — AMMONIA: AMMONIA: 23 umol/L (ref 9–35)

## 2018-02-10 MED ORDER — SODIUM CHLORIDE 0.9 % IV BOLUS
500.0000 mL | Freq: Once | INTRAVENOUS | Status: AC
  Administered 2018-02-10: 500 mL via INTRAVENOUS

## 2018-02-10 MED ORDER — FUROSEMIDE 10 MG/ML IJ SOLN
40.0000 mg | Freq: Once | INTRAMUSCULAR | Status: AC
  Administered 2018-02-10: 40 mg via INTRAVENOUS
  Filled 2018-02-10: qty 4

## 2018-02-10 NOTE — ED Notes (Signed)
14Fr Foley Catheter inserted; hematuria noticed after insertion. Pt complains of discomfort but not pain from Foley. Pt currently attempted to produce a BM on bedside commode. Pharmacy tech is reviewing home medications with husband Kirtland Bouchard).

## 2018-02-10 NOTE — ED Notes (Signed)
Pt requested bed pan; verbalized that she will call out when she is finished.

## 2018-02-10 NOTE — ED Notes (Signed)
Patient transported to CT 

## 2018-02-10 NOTE — ED Triage Notes (Signed)
Per EMS: pt has stage 4 uterine cancer and for the past 24 hours pt's pain had been 10/10 and unable to pass any urine.  Pt is alert but confused upon arrival.    EMS vitals:  BP 150/100 RR 16 Sp02 95% on RA

## 2018-02-10 NOTE — H&P (Signed)
History and Physical   Kerry Morris SEL:[id number] DOB: 24-Sep-1965 DOA: 02/10/2018  Referring MD/NP/PA: Dr Jeanell Sparrow  PCP: System, Pcp Not In   Outpatient Specialists: [facility]   Patient coming from: Home  Chief Complaint: Generalized weakness and edema  HPI: Kerry Morris is a 52 y.o. 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  Patient was apparently discharged today from there and was told there is no more chemo to be given.  Palliative care was suggested.  They came back home today and was brought to the ER due to significant swelling all over.  Patient also has hemoglobin 5.7.  Patient and family are talking about helping patient to get better in terms of comfort.  They appear to be more confused as to what they actually want.  No more chemo and the understand palliation will be the next option.  She is however having significant edema and renal failure with the anemia.  Patient is being admitted with the goals of mainly palliative treatment.  Patient and her husband are asking for nephrostomy tube.  Records have been obtained from [facility] to have a better idea what the prognosis is and care plan was..  ED Course: Her vitals are stable with a temperature of 98, blood pressure is 111/85 and pulse 108 respiratory rate of 8 oxygen sat 95% room air.  White count is 7.0 with hemoglobin 5.8 sodium 129 potassium 5.9 chloride 94 CO2 of 18 BUN 73 and creatinine 7.87 with calcium 9.0.  Glucose is 97.  Urinalysis showed moderate leukocytes with negative nitrites.  RBCs 11-20 and WBC 21-50 and rare bacteria.  Review of Systems: As per HPI otherwise 10 point review of systems negative.    Past Medical History:  Diagnosis Date  . Anxiety   . Anxiety and depression   . Cancer [facility])    uterine cancer  .  Depression   . Genetic testing 01/10/2017   Kerry Morris underwent genetic counseling and testing for hereditary cancer syndromes on 12/26/2016. Her results were negative for mutations in all 83 genes analyzed by [facility] 83-gene Multi-Cancers Panel. Genes analyzed include: ALK, APC, ATM, AXIN2, BAP1, BARD1, BLM, BMPR1A, BRCA1, BRCA2, BRIP1, CASR, CDC73, CDH1, CDK4, CDKN1B, CDKN1C, CDKN2A, CEBPA, CHEK2, CTNNA1, DICER1, DIS3L2, EGFR, EPCAM, F    Past Surgical History:  Procedure Laterality Date  . ABDOMINAL HYSTERECTOMY    . CERVICAL BIOPSY  W/ LOOP ELECTRODE EXCISION    . DENTAL SURGERY    . IR FLUORO GUIDE PORT INSERTION RIGHT  04/23/2017  . IR US GUIDE VASC ACCESS RIGHT  04/23/2017     reports that she quit smoking about 2 years ago. Her smoking use included cigarettes. She has a 25.00 pack-year smoking history. She has never used smokeless tobacco. She reports that she has current or past drug history. Drug: Marijuana. She reports that she does not drink alcohol.  Allergies  Allergen Reactions  . Lenvima [Lenvatinib] Other (See Comments)    seizure    Family History  Problem Relation Age of Onset  . Leukemia Mother 7       in remission  . Throat cancer Maternal Aunt        d.>50  . Alcohol abuse Maternal Uncle        d.>50     Prior to Admission medications  Medication Sig Start Date End Date Taking? Authorizing Provider  acetaminophen (TYLENOL) 325 MG tablet Take 325 mg by mouth every 6 (six) hours as needed for mild pain.    [provider]  ALPRAZolam Duanne Moron) 0.5 MG tablet Take 1 tablet (0.5 mg total) by mouth at bedtime as needed for anxiety or sleep. 12/31/17   Hosie Poisson, MD  celecoxib (CELEBREX) 200 MG capsule Take 200 mg by mouth daily as needed for moderate pain.    [provider]  dexamethasone (DECADRON) 6 MG tablet Take 1 tablet (6 mg total) by mouth daily. 01/01/18   Hosie Poisson, MD  enoxaparin (LOVENOX) 60 MG/0.6ML injection Inject 0.5 mLs  (50 mg total) into the skin every 12 (twelve) hours. 12/31/17   Hosie Poisson, MD  fentaNYL (DURAGESIC - DOSED MCG/HR) 25 MCG/HR patch Place 25 mcg onto the skin every 3 (three) days.    [provider]  lubiprostone (AMITIZA) 24 MCG capsule Take 24 mcg by mouth 2 (two) times daily.     [provider]  methadone (DOLOPHINE) 5 MG tablet Take 5 mg by mouth 2 (two) times daily.     [provider]  OVER THE COUNTER MEDICATION Inject 1 ampule as directed every other day. Viscum Abietis    [provider]  Oxycodone HCl 10 MG TABS Take 10 mg by mouth every 4 (four) hours as needed (for pain).  08/08/17   [provider]  pantoprazole (PROTONIX) 40 MG tablet Take 1 tablet (40 mg total) by mouth daily. 12/31/17   Hosie Poisson, MD  prochlorperazine (COMPAZINE) 10 MG tablet Take 1 tablet (10 mg total) by mouth every 6 (six) hours as needed for nausea or vomiting. 06/24/17   Heath Lark, MD  senna (SENOKOT) 8.6 MG TABS tablet Take 2 tablets (17.2 mg total) by mouth daily. Patient taking differently: Take 4 tablets by mouth daily.  10/08/17   Velvet Bathe, MD    Physical Exam: Vitals:   02/10/18 1911 02/10/18 1930 02/10/18 2030 02/10/18 2115  BP:  (!) 145/96 (!) 138/95 140/87  Pulse:  (!) 102 (!) 101 98  Resp:  11 (!) 8   Temp: (S) 98 F (36.7 C)     TempSrc: Rectal     SpO2:  100% 98% 99%      Constitutional: NAD, calm, comfortable Vitals:   02/10/18 1911 02/10/18 1930 02/10/18 2030 02/10/18 2115  BP:  (!) 145/96 (!) 138/95 140/87  Pulse:  (!) 102 (!) 101 98  Resp:  11 (!) 8   Temp: (S) 98 F (36.7 C)     TempSrc: Rectal     SpO2:  100% 98% 99%   Chronically ill looking, cachectic, loss of hair probably from chemo. Eyes: PERRL, lids and conjunctivae normal ENMT: Mucous membranes are moist. Posterior pharynx clear of any exudate or lesions.Normal dentition.  Neck: normal, supple, no masses, no thyromegaly Respiratory: clear to auscultation  bilaterally, no wheezing, no crackles. Normal respiratory effort. No accessory muscle use.  Cardiovascular: Regular rate and rhythm, no murmurs / rubs / gallops. No extremity edema. 2+ pedal pulses. No carotid bruits.  Abdomen: Distended abdomen, tympanitic no palpable masses  no tenderness, no masses palpated. No hepatosplenomegaly. Bowel sounds positive.  Musculoskeletal: no clubbing / cyanosis. No joint deformity upper and lower extremities. Good ROM, no contractures. Normal muscle tone.  Skin: no rashes, lesions, ulcers. No induration Neurologic: CN 2-12 grossly intact. Sensation intact, DTR normal. Strength 5/5 in all 4.  Psychiatric: Normal  judgment and insight. Alert and oriented x 3. Normal mood.     Labs on Admission: I have personally reviewed following labs and imaging studies  CBC: Recent Labs  Lab 02/10/18 1840  WBC 7.0  NEUTROABS 5.2  HGB 5.8*  HCT 18.1*  MCV 95.3  PLT 111   Basic Metabolic Panel: Recent Labs  Lab 02/10/18 1840  NA 129*  K 5.9*  CL 94*  CO2 18*  GLUCOSE 97  BUN 73*  CREATININE 7.87*  CALCIUM 9.0   GFR: CrCl cannot be calculated (Unknown ideal weight.). Liver Function Tests: Recent Labs  Lab 02/10/18 1840  AST 17  ALT 12  ALKPHOS 173*  BILITOT 1.2  PROT 6.8  ALBUMIN 2.7*   No results for input(s): LIPASE, AMYLASE in the last 168 hours. Recent Labs  Lab 02/10/18 1840  AMMONIA 23   Coagulation Profile: No results for input(s): INR, PROTIME in the last 168 hours. Cardiac Enzymes: No results for input(s): CKTOTAL, CKMB, CKMBINDEX, TROPONINI in the last 168 hours. BNP (last 3 results) No results for input(s): PROBNP in the last 8760 hours. HbA1C: No results for input(s): HGBA1C in the last 72 hours. CBG: No results for input(s): GLUCAP in the last 168 hours. Lipid Profile: No results for input(s): CHOL, HDL, LDLCALC, TRIG, CHOLHDL, LDLDIRECT in the last 72 hours. Thyroid Function Tests: No results for input(s): TSH, T4TOTAL,  FREET4, T3FREE, THYROIDAB in the last 72 hours. Anemia Panel: No results for input(s): VITAMINB12, FOLATE, FERRITIN, TIBC, IRON, RETICCTPCT in the last 72 hours. Urine analysis:    Component Value Date/Time   COLORURINE YELLOW 02/10/2018 1923   APPEARANCEUR HAZY (A) 02/10/2018 1923   LABSPEC 1.012 02/10/2018 1923   LABSPEC 1.005 03/12/2017 1011   PHURINE 5.0 02/10/2018 1923   GLUCOSEU NEGATIVE 02/10/2018 1923   GLUCOSEU Negative 03/12/2017 1011   HGBUR SMALL (A) 02/10/2018 1923   BILIRUBINUR NEGATIVE 02/10/2018 1923   BILIRUBINUR Negative 03/12/2017 1011   KETONESUR NEGATIVE 02/10/2018 1923   PROTEINUR 100 (A) 02/10/2018 1923   UROBILINOGEN 0.2 03/12/2017 1011   NITRITE NEGATIVE 02/10/2018 1923   LEUKOCYTESUR MODERATE (A) 02/10/2018 1923   LEUKOCYTESUR Trace 03/12/2017 1011   Sepsis Labs: @LABRCNTIP (procalcitonin:4,lacticidven:4) )No results found for this or any previous visit (from the past 240 hour(s)).   Radiological Exams on Admission: Ct Abdomen Pelvis Wo Contrast  Result Date: 02/10/2018 CLINICAL DATA:  Stage IV uterine cancer. Increasing pain over the past 24 hours. Unable to pass urine. Abdominal distention. EXAM: CT ABDOMEN AND PELVIS WITHOUT CONTRAST TECHNIQUE: Multidetector CT imaging of the abdomen and pelvis was performed following the standard protocol without IV contrast. COMPARISON:  12/27/2017 FINDINGS: Lower chest: Dependent changes in the lung bases. Small esophageal hiatal hernia. Hepatobiliary: Noncontrast examination limits evaluation of solid organs but there appears to be multiple low-attenuation lesions throughout the liver consistent with metastatic disease. Difficult to compare with previous contrast-enhanced study, but I suspect progression since that previous study. Gallbladder and bile ducts are unremarkable. Pancreas: Unremarkable. No pancreatic ductal dilatation or surrounding inflammatory changes. Spleen: Normal in size without focal abnormality.  Adrenals/Urinary Tract: No adrenal gland nodules. Bilateral ureteral stents are present. Proximal pigtails are positioned in the renal pelvis and distal pigtails in the bladder. There is prominent bilateral hydronephrosis and hydroureter. This is similar to previous study and may indicate non functioning of the stents or may be due to reflux. No stones are identified. The bladder wall is thickened, asymmetrically more prominent posteriorly. This is similar to previous study  and may reflect tumor infiltration or radiation change. Bladder is not abnormally distended. Stomach/Bowel: Stomach, small bowel, and colon are mostly decompressed. No abnormal distention. There is contrast material throughout the colon. No wall thickening is appreciated. Vascular/Lymphatic: Enlarged mesenteric and retroperitoneal lymph nodes are present, better visualized on the previous contrast-enhanced study. Likely metastatic. Calcification of the aorta without aneurysm. Reproductive: Surgical resection of uterus and ovaries. There is a mass lesion in the low pelvis between the rectum and the bladder and surrounding the rectosigmoid colon junction. There is likely direct invasion of the rectosigmoid colon and of the posterior bladder. This mass is better demonstrated on the previous contrast-enhanced study. Pelvic lymphadenopathy bilaterally, likely metastatic. Nodular infiltration in the mesentery likely metastatic. Other: Diffuse edema throughout the subcutaneous fatty tissues, progressing since previous study. No free air or free fluid identified in the abdomen. Musculoskeletal: No destructive bone lesions are appreciated. IMPRESSION: 1. There is evidence of local tumor recurrence in the pelvis and diffuse metastatic disease with a mass lesion between the rectum and bladder, likely demonstrating direct invasion of the rectosigmoid colon and posterior bladder wall. Metastatic lymphadenopathy in the mesentery and retroperitoneum. Peritoneal  tumor implants. Diffuse liver metastases, likely progressing. 2. Bilateral ureteral stents. Persistent bilateral hydronephrosis and hydroureter similar to prior study. This could be due to stent malfunction or reflux. 3. Asymmetric bladder wall thickening without bladder distention. This may indicate radiation change and/or tumor infiltration. 4. Diffuse edema throughout the subcutaneous fatty tissues is developing since the previous study. 5. No evidence of bowel obstruction although the rectosigmoid colon is narrowed by the tumor and there is residual contrast material throughout the colon. Electronically Signed   By: Lucienne Capers M.D.   On: 02/10/2018 22:01   Ct Head Wo Contrast  Result Date: 02/10/2018 CLINICAL DATA:  Stage IV uterine cancer, confusion. EXAM: CT HEAD WITHOUT CONTRAST TECHNIQUE: Contiguous axial images were obtained from the base of the skull through the vertex without intravenous contrast. COMPARISON:  12/16/2017. FINDINGS: Brain: No evidence for acute infarction, hemorrhage, mass lesion, hydrocephalus, or extra-axial fluid. Normal for age cerebral volume. Hypoattenuation of white matter could represent small vessel disease or post treatment effect. No areas of vasogenic edema. Vascular: No hyperdense vessel or unexpected calcification. Skull: Normal. Negative for fracture or focal lesion. Sinuses/Orbits: No acute finding. Other: None. IMPRESSION: No acute intracranial findings. No findings concerning for metastatic disease on this noncontrast exam. Hypoattenuation of white matter could represent post treatment effect or small vessel disease, but the appearance is stable from July 2019. Electronically Signed   By: Staci Righter M.D.   On: 02/10/2018 19:50   Dg Chest Port 1 View  Result Date: 02/10/2018 CLINICAL DATA:  Shortness of breath EXAM: PORTABLE CHEST 1 VIEW COMPARISON:  CT 12/27/2017, radiograph 05/01/2005 FINDINGS: Right-sided central venous port tip over the right atrium.  No focal airspace disease. Cardiomediastinal silhouette within normal limits. No pneumothorax. IMPRESSION: No active disease. Electronically Signed   By: Donavan Foil M.D.   On: 02/10/2018 18:06     Assessment/Plan Principal Problem:   Anemia Active Problems:   Endometrial cancer (HCC)   Weight loss   Bilateral hydronephrosis   Symptomatic anemia     #1 symptomatic anemia: Patient has generalized edema as well as shortness of breath.  Probably a combination of renal failure with anemia.  Patient will be transfused 3 units of packed red blood cells mainly for palliative reasons.  Husband aware of plans for palliation mainly.  Recheck hemoglobin in  the morning  #2 acute on chronic kidney failure: Most likely obstructive uropathy from cancer.  CT abdomen and pelvis pending.  Depending on the results we will consult urology.  I am not sure nephrostomy tube will be helpful to the patient as her prognosis is poor.  #3 endometrial cancer: Patient's cancer is advanced.  Normal active treatment.  Palliative care only.  Will consider palliative care consult in the hospital.  #4 hyperkalemia: Potassium is elevated secondary to renal failure.  Recheck level and if still high will use Kayexalate.  #5 generalized debility: Secondary to advanced cancer.   DVT prophylaxis: SCD Code Status: DNR Family Communication: Husband who is with patient Disposition Plan: To be determined Consults called: None at the moment but urology in the morning Admission status: Inpatient  Severity of Illness: The appropriate patient status for this patient is INPATIENT. Inpatient status is judged to be reasonable and necessary in order to provide the required intensity of service to ensure the patient's safety. The patient's presenting symptoms, physical exam findings, and initial radiographic and laboratory data in the context of their chronic comorbidities is felt to place them at high risk for further clinical  deterioration. Furthermore, it is not anticipated that the patient will be medically stable for discharge from the hospital within 2 midnights of admission. The following factors support the patient status of inpatient.   " The patient's presenting symptoms include generalized weakness and edema. " The worrisome physical exam findings include cachexia with distended abdomen. " The initial radiographic and laboratory data are worrisome because of hemoglobin of 5.8. " The chronic co-morbidities include end-stage endometrial cancer.   * I certify that at the point of admission it is my clinical judgment that the patient will require inpatient hospital care spanning beyond 2 midnights from the point of admission due to high intensity of service, high risk for further deterioration and high frequency of surveillance required.Barbette Merino MD [facility] Pager [phone]  If 7PM-7AM, please contact night-coverage [email or web address] Password [facility]  02/10/2018, 10:13 PM

## 2018-02-10 NOTE — ED Notes (Signed)
Phlebotomy at bedside attempting to draw blood cultures 

## 2018-02-10 NOTE — ED Provider Notes (Addendum)
[facility] EMERGENCY DEPARTMENT Provider Note   CSN: [id number] Arrival date & time: 02/10/18  1714     History   Chief Complaint Chief Complaint  Patient presents with  . Cancer Pt  . Abdominal Pain    HPI Kerry Morris is a 52 y.o. female.  HPI 52 yo female ho uterine cancer stage 4 previously treated at [facility] but d/c'd due to husband due to futility of treatment, patient last seen here in early August with diagnosis of drug-induced constipation, cancer associated pain, bilateral hydronephrosis, SVC syndrome and stated at that time that she was on palliative care.  Today, husband states that she has had increased swelling and is unable to walk due to this.  He states that she was offered nephrostomy tube in [place] last week, but declined.  He would like for her to have a nephrostomy tube placed now.  I am unable to obtain labs or discharge summary or information from [facility] [place] via care everywhere.  Past Medical History:  Diagnosis Date  . Anxiety   . Anxiety and depression   . Cancer [facility])    uterine cancer  . Depression   . Genetic testing 01/10/2017   Ms. Cauthon underwent genetic counseling and testing for hereditary cancer syndromes on 12/26/2016. Her results were negative for mutations in all 83 genes analyzed by [facility]'s 83-gene Multi-Cancers Panel. Genes analyzed include: ALK, APC, ATM, AXIN2, BAP1, BARD1, BLM, BMPR1A, BRCA1, BRCA2, BRIP1, CASR, CDC73, CDH1, CDK4, CDKN1B, CDKN1C, CDKN2A, CEBPA, CHEK2, CTNNA1, DICER1, DIS3L2, EGFR, EPCAM, F    Patient Active Problem List   Diagnosis Date Noted  . Anxiety state   . Palliative care by specialist   . Facial edema 12/27/2017  . 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 Cancer related pain 10/04/2017  . Bilateral hydronephrosis 10/01/2017  . Oral thrush 06/24/2017  . Abdominal pain 03/11/2017  . Goals of care, counseling/discussion 02/22/2017  . Arteriosclerosis  01/31/2017  . Genetic testing 01/10/2017  . Leukopenia due to antineoplastic chemotherapy ([place]) 12/20/2016  . Cancer associated pain 11/21/2016  . Weight loss 11/08/2016  . Drug-induced constipation 11/08/2016  . Endometrial cancer ([place]) 11/07/2016    Past Surgical History:  Procedure Laterality Date  . ABDOMINAL HYSTERECTOMY    . CERVICAL BIOPSY  W/ LOOP ELECTRODE EXCISION    . DENTAL SURGERY    . IR FLUORO GUIDE PORT INSERTION RIGHT  04/23/2017  . IR US GUIDE VASC ACCESS RIGHT  04/23/2017     OB History    Gravida  0   Para  0   Term  0   Preterm  0   AB  0   Living  0     SAB  0   TAB  0   Ectopic  0   Multiple  0   Live Births               Home Medications    Prior to Admission medications   Medication Sig Start Date End Date Taking? Authorizing Provider  acetaminophen (TYLENOL) 325 MG tablet Take 325 mg by mouth every 6 (six) hours as needed for mild pain.    [provider]  ALPRAZolam Duanne Moron) 0.5 MG tablet Take 1 tablet (0.5 mg total) by mouth at bedtime as needed for anxiety or sleep. 12/31/17   Hosie Poisson, MD  celecoxib (CELEBREX) 200 MG capsule Take 200 mg by mouth daily as needed for moderate pain.    [provider]  dexamethasone (DECADRON) 6 MG tablet Take 1 tablet (6 mg total) by mouth daily. 01/01/18   Hosie Poisson, MD  enoxaparin (LOVENOX) 60 MG/0.6ML injection Inject 0.5 mLs (50 mg total) into the skin every 12 (twelve) hours. 12/31/17   Hosie Poisson, MD  fentaNYL (DURAGESIC - DOSED MCG/HR) 25 MCG/HR patch Place 25 mcg onto the skin every 3 (three) days.    [provider]  lubiprostone (AMITIZA) 24 MCG capsule Take 24 mcg by mouth 2 (two) times daily.     [provider]  methadone (DOLOPHINE) 5 MG tablet Take 5 mg by mouth 2 (two) times daily.     [provider]  OVER THE COUNTER MEDICATION Inject 1 ampule as directed every other day. Viscum Abietis    [provider]  Oxycodone HCl  10 MG TABS Take 10 mg by mouth every 4 (four) hours as needed (for pain).  08/08/17   [provider]  pantoprazole (PROTONIX) 40 MG tablet Take 1 tablet (40 mg total) by mouth daily. 12/31/17   Hosie Poisson, MD  prochlorperazine (COMPAZINE) 10 MG tablet Take 1 tablet (10 mg total) by mouth every 6 (six) hours as needed for nausea or vomiting. 06/24/17   Heath Lark, MD  senna (SENOKOT) 8.6 MG TABS tablet Take 2 tablets (17.2 mg total) by mouth daily. Patient taking differently: Take 4 tablets by mouth daily.  10/08/17   Velvet Bathe, MD    Family History Family History  Problem Relation Age of Onset  . Leukemia Mother 17       in remission  . Throat cancer Maternal Aunt        d.>50  . Alcohol abuse Maternal Uncle        d.>50    Social History Social History   Tobacco Use  . Smoking status: Former Smoker    Packs/day: 1.00    Years: 25.00    Pack years: 25.00    Types: Cigarettes    Last attempt to quit: 03/28/2015    Years since quitting: 2.8  . Smokeless tobacco: Never Used  Substance Use Topics  . Alcohol use: No    Alcohol/week: 0.0 standard drinks    Comment: Previous h/o alcoholism, sober x 8 years  . Drug use: Not Currently    Types: Marijuana    Comment: remote h/o IVDU, has been worked up for parenterally transmitted infections multiple times since     Allergies   Lenvima [lenvatinib]   Review of Systems Review of Systems   Physical Exam Updated Vital Signs BP (!) 141/72   Pulse 97   Temp (S) 98 F (36.7 C) (Rectal)   Resp 15   SpO2 100%   Physical Exam  Constitutional: She appears well-developed.  Cachectic female who appears chronically ill  HENT:  Head: Normocephalic and atraumatic.  Mucous membranes are dry  Eyes: Pupils are equal, round, and reactive to light.  Cardiovascular: Normal rate and regular rhythm.  Pulmonary/Chest: Effort normal and breath sounds normal.  Abdominal: Normal appearance. She exhibits distension. She  exhibits no fluid wave. Bowel sounds are decreased.  Neurological: She is alert.  Skin: Skin is warm. Capillary refill takes less than 2 seconds.  Nursing note and vitals reviewed.    ED Treatments / Results  Labs (all labs ordered are listed, but only abnormal results are displayed) Labs Reviewed  CBC WITH DIFFERENTIAL/PLATELET - Abnormal; Notable for the following components:      Result Value  RBC 1.90 (*)    Hemoglobin 5.8 (*)    HCT 18.1 (*)    RDW 17.7 (*)    All other components within normal limits  URINALYSIS, ROUTINE W REFLEX MICROSCOPIC - Abnormal; Notable for the following components:   APPearance HAZY (*)    Hgb urine dipstick SMALL (*)    Protein, ur 100 (*)    Leukocytes, UA MODERATE (*)    Bacteria, UA RARE (*)    Non Squamous Epithelial 0-5 (*)    All other components within normal limits  COMPREHENSIVE METABOLIC PANEL - Abnormal; Notable for the following components:   Sodium 129 (*)    Potassium 5.9 (*)    Chloride 94 (*)    CO2 18 (*)    BUN 73 (*)    Creatinine, Ser 7.87 (*)    Albumin 2.7 (*)    Alkaline Phosphatase 173 (*)    GFR calc non Af Amer 5 (*)    GFR calc Af Amer 6 (*)    Anion gap 17 (*)    All other components within normal limits  CULTURE, BLOOD (ROUTINE X 2)  CULTURE, BLOOD (ROUTINE X 2)  AMMONIA    EKG EKG Interpretation  Date/Time:  Monday February 10 2018 20:15:02 EDT Ventricular Rate:  98 PR Interval:    QRS Duration: 93 QT Interval:  339 QTC Calculation: 433 R Axis:   31 Text Interpretation:  Sinus rhythm Consider left ventricular hypertrophy Baseline wander in lead(s) V5 Confirmed by Pattricia Boss [phone]) on 02/10/2018 10:44:12 PM   Radiology Ct Head Wo Contrast  Result Date: 02/10/2018 CLINICAL DATA:  Stage IV uterine cancer, confusion. EXAM: CT HEAD WITHOUT CONTRAST TECHNIQUE: Contiguous axial images were obtained from the base of the skull through the vertex without intravenous contrast. COMPARISON:  12/16/2017.  FINDINGS: Brain: No evidence for acute infarction, hemorrhage, mass lesion, hydrocephalus, or extra-axial fluid. Normal for age cerebral volume. Hypoattenuation of white matter could represent small vessel disease or post treatment effect. No areas of vasogenic edema. Vascular: No hyperdense vessel or unexpected calcification. Skull: Normal. Negative for fracture or focal lesion. Sinuses/Orbits: No acute finding. Other: None. IMPRESSION: No acute intracranial findings. No findings concerning for metastatic disease on this noncontrast exam. Hypoattenuation of white matter could represent post treatment effect or small vessel disease, but the appearance is stable from July 2019. Electronically Signed   By: Staci Righter M.D.   On: 02/10/2018 19:50   Dg Chest Port 1 View  Result Date: 02/10/2018 CLINICAL DATA:  Shortness of breath EXAM: PORTABLE CHEST 1 VIEW COMPARISON:  CT 12/27/2017, radiograph 05/01/2005 FINDINGS: Right-sided central venous port tip over the right atrium. No focal airspace disease. Cardiomediastinal silhouette within normal limits. No pneumothorax. IMPRESSION: No active disease. Electronically Signed   By: Donavan Foil M.D.   On: 02/10/2018 18:06    Procedures Procedures (including critical care time)  Medications Ordered in ED Medications  sodium chloride 0.9 % bolus 500 mL (has no administration in time range)  furosemide (LASIX) injection 40 mg (has no administration in time range)     Initial Impression / Assessment and Plan / ED Course  I have reviewed the triage vital signs and the nursing notes.  Pertinent labs & imaging results that were available during my care of the patient were reviewed by me and considered in my medical decision making (see chart for details).     52 year old female with metastatic uterine cancer presents today after being placed on hospice care  by [facility] [place] in [place].  Husband states that they were told that nephrostomy  tubes were an option and he thinks that she is having declined due to renal failure due to needing a nephrostomy tube.  I am unable to access her records on care everywhere, but did speak with Marzetta Board, RN at [facility].  States that she was discharged today and was discharged to hospice.  Here at the patient is somewhat confused from baseline reports.  She had urine in her bladder of approximately 500 cc.  A Foley catheter was placed.  Her anemia has increased from last here at 9.8-5.8 today.  Creatinine has increased from 0.77 to 7.87. 1 metastatic cancer 2 renal failure 3 hyperkalemia 4- severe anemia Discussed with Dr. Jonelle Sidle and he has seen and evaluated patient.  Wife had multiple discussions with the patient's husband and attempt to clarify her status. Husband states she is to be DNR Discussed with Dr. Jonelle Sidle   Final Clinical Impressions(s) / ED Diagnoses   Final diagnoses:  Renal failure    ED Discharge Orders    None       Pattricia Boss, MD 02/11/18 1806    Pattricia Boss, MD 02/11/18 5329

## 2018-02-11 ENCOUNTER — Inpatient Hospital Stay (HOSPITAL_COMMUNITY): Payer: BLUE CROSS/BLUE SHIELD

## 2018-02-11 DIAGNOSIS — E875 Hyperkalemia: Secondary | ICD-10-CM

## 2018-02-11 DIAGNOSIS — Z66 Do not resuscitate: Secondary | ICD-10-CM

## 2018-02-11 DIAGNOSIS — Z515 Encounter for palliative care: Secondary | ICD-10-CM

## 2018-02-11 DIAGNOSIS — N179 Acute kidney failure, unspecified: Principal | ICD-10-CM

## 2018-02-11 DIAGNOSIS — N1330 Unspecified hydronephrosis: Secondary | ICD-10-CM

## 2018-02-11 DIAGNOSIS — G893 Neoplasm related pain (acute) (chronic): Secondary | ICD-10-CM

## 2018-02-11 DIAGNOSIS — C541 Malignant neoplasm of endometrium: Secondary | ICD-10-CM

## 2018-02-11 LAB — COMPREHENSIVE METABOLIC PANEL
ALK PHOS: 164 U/L — AB (ref 38–126)
ALT: 12 U/L (ref 0–44)
AST: 15 U/L (ref 15–41)
Albumin: 2.9 g/dL — ABNORMAL LOW (ref 3.5–5.0)
Anion gap: 15 (ref 5–15)
BILIRUBIN TOTAL: 1 mg/dL (ref 0.3–1.2)
BUN: 63 mg/dL — ABNORMAL HIGH (ref 6–20)
CALCIUM: 9.2 mg/dL (ref 8.9–10.3)
CO2: 20 mmol/L — ABNORMAL LOW (ref 22–32)
CREATININE: 4.96 mg/dL — AB (ref 0.44–1.00)
Chloride: 97 mmol/L — ABNORMAL LOW (ref 98–111)
GFR, EST AFRICAN AMERICAN: 11 mL/min — AB (ref >60)
GFR, EST NON AFRICAN AMERICAN: 9 mL/min — AB (ref >60)
Glucose, Bld: 82 mg/dL (ref 70–99)
Potassium: 4.7 mmol/L (ref 3.5–5.1)
Sodium: 132 mmol/L — ABNORMAL LOW (ref 135–145)
Total Protein: 7.3 g/dL (ref 6.5–8.1)

## 2018-02-11 LAB — CBC
HCT: 28.6 % — ABNORMAL LOW (ref 36.0–46.0)
Hemoglobin: 9.7 g/dL — ABNORMAL LOW (ref 12.0–15.0)
MCH: 30.3 pg (ref 26.0–34.0)
MCHC: 33.9 g/dL (ref 30.0–36.0)
MCV: 89.4 fL (ref 78.0–100.0)
Platelets: 253 10*3/uL (ref 150–400)
RBC: 3.2 MIL/uL — ABNORMAL LOW (ref 3.87–5.11)
RDW: 17.3 % — AB (ref 11.5–15.5)
WBC: 4.5 10*3/uL (ref 4.0–10.5)

## 2018-02-11 LAB — PREPARE RBC (CROSSMATCH)

## 2018-02-11 LAB — ABO/RH: ABO/RH(D): A POS

## 2018-02-11 MED ORDER — SODIUM CHLORIDE 0.9% IV SOLUTION: Freq: Once | INTRAVENOUS | Status: DC

## 2018-02-11 MED ORDER — NALOXONE HCL 0.4 MG/ML IJ SOLN: 0.4000 mg | INTRAMUSCULAR | Status: DC | PRN

## 2018-02-11 MED ORDER — HYDROMORPHONE HCL 1 MG/ML IJ SOLN
1.0000 mg | INTRAMUSCULAR | Status: DC | PRN
  Administered 2018-02-11: 1 mg via INTRAVENOUS
  Filled 2018-02-11: qty 1

## 2018-02-11 MED ORDER — DIPHENHYDRAMINE HCL 12.5 MG/5ML PO ELIX: 12.5000 mg | ORAL_SOLUTION | Freq: Four times a day (QID) | ORAL | Status: DC | PRN

## 2018-02-11 MED ORDER — ONDANSETRON HCL 4 MG/2ML IJ SOLN: 4.0000 mg | Freq: Four times a day (QID) | INTRAMUSCULAR | Status: DC | PRN

## 2018-02-11 MED ORDER — HYDROMORPHONE 1 MG/ML IV SOLN
INTRAVENOUS | Status: DC
  Filled 2018-02-11: qty 25

## 2018-02-11 MED ORDER — BELLADONNA ALKALOIDS-OPIUM 16.2-60 MG RE SUPP
1.0000 | Freq: Three times a day (TID) | RECTAL | Status: DC | PRN
  Administered 2018-02-12: 1 via RECTAL
  Filled 2018-02-11: qty 1

## 2018-02-11 MED ORDER — ACETAMINOPHEN 650 MG RE SUPP: 650.0000 mg | Freq: Four times a day (QID) | RECTAL | Status: DC | PRN

## 2018-02-11 MED ORDER — ACETAMINOPHEN 325 MG PO TABS: 325.0000 mg | ORAL_TABLET | Freq: Four times a day (QID) | ORAL | Status: DC | PRN

## 2018-02-11 MED ORDER — PANTOPRAZOLE SODIUM 40 MG PO TBEC
40.0000 mg | DELAYED_RELEASE_TABLET | Freq: Every day | ORAL | Status: DC
  Administered 2018-02-11 – 2018-02-12 (×2): 40 mg via ORAL
  Filled 2018-02-11 (×2): qty 1

## 2018-02-11 MED ORDER — DIPHENHYDRAMINE HCL 50 MG/ML IJ SOLN: 12.5000 mg | Freq: Four times a day (QID) | INTRAMUSCULAR | Status: DC | PRN

## 2018-02-11 MED ORDER — FENTANYL 100 MCG/HR TD PT72: 100.0000 ug | MEDICATED_PATCH | TRANSDERMAL | Status: DC

## 2018-02-11 MED ORDER — LUBIPROSTONE 24 MCG PO CAPS
24.0000 ug | ORAL_CAPSULE | Freq: Two times a day (BID) | ORAL | Status: DC
  Administered 2018-02-11 – 2018-02-15 (×8): 24 ug via ORAL
  Filled 2018-02-11 (×12): qty 1

## 2018-02-11 MED ORDER — GABAPENTIN 300 MG PO CAPS
300.0000 mg | ORAL_CAPSULE | Freq: Two times a day (BID) | ORAL | Status: DC
  Administered 2018-02-11 – 2018-02-12 (×3): 300 mg via ORAL
  Filled 2018-02-11 (×3): qty 1

## 2018-02-11 MED ORDER — ALPRAZOLAM 0.5 MG PO TABS
0.5000 mg | ORAL_TABLET | Freq: Three times a day (TID) | ORAL | Status: DC | PRN
  Administered 2018-02-11 – 2018-02-12 (×2): 0.5 mg via ORAL
  Filled 2018-02-11: qty 1

## 2018-02-11 MED ORDER — METHADONE HCL 5 MG PO TABS
5.0000 mg | ORAL_TABLET | Freq: Two times a day (BID) | ORAL | Status: DC
  Administered 2018-02-11 (×2): 5 mg via ORAL
  Filled 2018-02-11 (×3): qty 1

## 2018-02-11 MED ORDER — OXYCODONE HCL 5 MG PO TABS
10.0000 mg | ORAL_TABLET | ORAL | Status: DC | PRN
  Administered 2018-02-11 (×2): 10 mg via ORAL
  Filled 2018-02-11 (×2): qty 2

## 2018-02-11 MED ORDER — ENSURE ENLIVE PO LIQD
237.0000 mL | Freq: Two times a day (BID) | ORAL | Status: DC
  Administered 2018-02-12 – 2018-02-15 (×2): 237 mL via ORAL

## 2018-02-11 MED ORDER — SODIUM CHLORIDE 0.9% FLUSH: 9.0000 mL | INTRAVENOUS | Status: DC | PRN

## 2018-02-11 MED ORDER — SENNOSIDES-DOCUSATE SODIUM 8.6-50 MG PO TABS
1.0000 | ORAL_TABLET | Freq: Two times a day (BID) | ORAL | Status: DC
  Administered 2018-02-11 – 2018-02-15 (×7): 1 via ORAL
  Filled 2018-02-11 (×8): qty 1

## 2018-02-11 MED ORDER — ALPRAZOLAM 0.5 MG PO TABS
0.5000 mg | ORAL_TABLET | Freq: Every evening | ORAL | Status: DC | PRN
  Administered 2018-02-11: 0.5 mg via ORAL
  Filled 2018-02-11 (×2): qty 1

## 2018-02-11 MED ORDER — FENTANYL 25 MCG/HR TD PT72
25.0000 ug | MEDICATED_PATCH | TRANSDERMAL | Status: DC
  Administered 2018-02-11: 25 ug via TRANSDERMAL
  Filled 2018-02-11: qty 1

## 2018-02-11 MED ORDER — HYDROMORPHONE 1 MG/ML IV SOLN: INTRAVENOUS | Status: DC

## 2018-02-11 MED ORDER — PROCHLORPERAZINE MALEATE 10 MG PO TABS
10.0000 mg | ORAL_TABLET | Freq: Four times a day (QID) | ORAL | Status: DC | PRN
  Administered 2018-02-12: 10 mg via ORAL
  Filled 2018-02-11: qty 1

## 2018-02-11 MED ORDER — METHADONE HCL 10 MG PO TABS
10.0000 mg | ORAL_TABLET | Freq: Two times a day (BID) | ORAL | Status: DC
  Administered 2018-02-11: 10 mg via ORAL
  Filled 2018-02-11: qty 1

## 2018-02-11 MED ORDER — OXYCODONE HCL 5 MG PO TABS
15.0000 mg | ORAL_TABLET | ORAL | Status: DC | PRN
  Administered 2018-02-12 – 2018-02-13 (×5): 15 mg via ORAL
  Filled 2018-02-11 (×5): qty 3

## 2018-02-11 MED ORDER — ACETAMINOPHEN 325 MG PO TABS: 650.0000 mg | ORAL_TABLET | Freq: Four times a day (QID) | ORAL | Status: DC | PRN

## 2018-02-11 MED ORDER — SENNA 8.6 MG PO TABS
4.0000 | ORAL_TABLET | Freq: Every day | ORAL | Status: DC
  Administered 2018-02-11: 34.4 mg via ORAL
  Filled 2018-02-11: qty 4

## 2018-02-11 MED ORDER — DEXAMETHASONE 4 MG PO TABS
6.0000 mg | ORAL_TABLET | Freq: Every day | ORAL | Status: DC
  Administered 2018-02-11: 6 mg via ORAL
  Filled 2018-02-11: qty 2

## 2018-02-11 MED ORDER — ONDANSETRON HCL 4 MG PO TABS: 4.0000 mg | ORAL_TABLET | Freq: Four times a day (QID) | ORAL | Status: DC | PRN

## 2018-02-11 MED ORDER — HYDROMORPHONE 1 MG/ML IV SOLN
INTRAVENOUS | Status: DC
  Administered 2018-02-11: 10.23 mg via INTRAVENOUS
  Administered 2018-02-11: 20:00:00 via INTRAVENOUS
  Administered 2018-02-12: 8.11 mg via INTRAVENOUS
  Administered 2018-02-12: 5.22 mg via INTRAVENOUS
  Administered 2018-02-12: 08:00:00 via INTRAVENOUS
  Filled 2018-02-11 (×2): qty 25

## 2018-02-11 NOTE — Consult Note (Signed)
                                                                                 Consultation Note Date: 02/11/2018   Patient Name: Kerry Morris  DOB: 11/29/1965  MRN: [id number]  Age / Sex: 52 y.o., female  PCP: System, Pcp Not In Referring Physician: Alekh, Kshitiz, MD  Reason for Consultation: Establishing goals of care, Pain control and Psychosocial/spiritual support  HPI/Patient Profile: 52 y.o. female  admitted on 02/10/2018 with   PMH significant for stage IV uterine cancer, anxiety, and depression who was admitted yesterday with c/o decreased UOP and LE edema.  [facility] and told that additional chemo was not an option and palliative care was suggested.  She has a dilaudid PCA pump that she uses routinely.    On admission she was noted to have a Hg of 5.7, K 5.9, and Cr of 7.87 (baseline 0.8).  CT scan revealed evidence of local tumor recurrence in the pelvis and diffuse metastatic disease with a mass lesion between the rectum and bladder likely demonstrating direct invasion of the rectosigmoid colon and posterior bladder wall; bilateral ureteral stents are present with persistent bilateral hydronephrosis and hydroureter similar to prior study.   Patient and family face treatment option decisions, advanced directive decisions and anticipatory care needs.   Clinical Assessment and Goals of Care:   This NP Kerry Morris reviewed medical records, received report from team, assessed the patient and then meet at the patient's bedside along with her husband/Hugo and mother/Eddie  to discuss diagnosis, prognosis, GOC, EOL wishes disposition and options.  Concept of Hospice and Palliative Care were discussed  A detailed discussion was had today regarding advanced directives.  Concepts specific to code status, artifical feeding and hydration, continued IV antibiotics and rehospitalization was had.  The difference  between a aggressive medical intervention path  and a palliative comfort care path for this patient at this time was had.  Values and goals of care important to patient and family were attempted to be elicited.  Created space and opportunity for both her husband and her mother to share their love and caring for the patient and also to explore their feelings regarding the current medical situation.  Emotional support offered  Natural trajectory and expectations at EOL were discussed.  Questions and concerns addressed.   Family encouraged to call with questions or concerns.    PMT will continue to support holistically.  NEXT OF KIN    SUMMARY OF RECOMMENDATIONS    Code Status/Advance Care Planning:  DNR   Symptom Management:   Patient currently resting quietly, appears comfortable   Pain: Continue home medication regime -PCA/Dilaudid pump programed from [facility] is being utilized and will converted to [place] pump  -Methadone 10 mg po bid -Fentanyl 100 mcg patch   Once patient has stabilized and goals have been established it  will be important to streamline pain management strategy.           Palliative Prophylaxis:   Aspiration, Bowel Regimen, Frequent Pain Assessment and Oral Care  Additional Recommendations (Limitations, Scope, Preferences):    Await input from Urology, however family  verbalizes a clear understanding of the limited prognosis and their openness to hospice services  Family request that this NP return in the morning to have a detailed conversation with the patient herself/if patient is able  regarding her understanding of the current medical situation and her overall goals of care.  Psycho-social/Spiritual:   Desire for further Chaplaincy support:yes  Additional Recommendations: Education on Hospice and Grief/Bereavement Support  Prognosis:   < 4 weeks  Discharge Planning: To Be Determined      Primary Diagnoses: Present on Admission: .  Endometrial cancer (HCC) . Weight loss . Bilateral hydronephrosis . Anemia . Symptomatic anemia . Hyperkalemia . Acute kidney injury (HCC)   I have reviewed the medical record, interviewed the patient and family, and examined the patient. The following aspects are pertinent.  Past Medical History:  Diagnosis Date  . Anxiety   . Anxiety and depression   . Cancer (HCC)    uterine cancer  . Depression   . Genetic testing 01/10/2017   Ms. Dragon underwent genetic counseling and testing for hereditary cancer syndromes on 12/26/2016. Her results were negative for mutations in all 83 genes analyzed by [facility]'s 83-gene Multi-Cancers Panel. Genes analyzed include: ALK, APC, ATM, AXIN2, BAP1, BARD1, BLM, BMPR1A, BRCA1, BRCA2, BRIP1, CASR, CDC73, CDH1, CDK4, CDKN1B, CDKN1C, CDKN2A, CEBPA, CHEK2, CTNNA1, DICER1, DIS3L2, EGFR, EPCAM, F   Social History   Socioeconomic History  . Marital status: Married    Spouse name: Hugo  . Number of children: 0  . Years of education: Not on file  . Highest education level: Not on file  Occupational History  . 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 Financial resource strain: Not on file  . Food insecurity:    Worry: Not on file    Inability: Not on file  . Transportation needs:    Medical: Not on file    Non-medical: Not on file  Tobacco Use  . Smoking status: Former Smoker    Packs/day: 1.00    Years: 25.00    Pack years: 25.00    Types: Cigarettes    Last attempt to quit: 03/28/2015    Years since quitting: 2.8  . Smokeless tobacco: Never Used  Substance and Sexual Activity  . Alcohol use: No    Alcohol/week: 0.0 standard drinks    Comment: Previous h/o alcoholism, sober x 8 years  . Drug use: Not Currently    Types: Marijuana    Comment: remote h/o IVDU, has been worked up for parenterally transmitted infections multiple times since  . Sexual activity: Yes    Partners: Male  Lifestyle  . Physical activity:    Days  per week: Not on file    Minutes per session: Not on file  . Stress: Not on file  Relationships  . Social connections:    Talks on phone: Not on file    Gets together: Not on file    Attends religious service: Not on file    Active member of club or organization: Not on file    Attends meetings of clubs or organizations: Not on file    Relationship status: Not on file  Other Topics Concern  . Not on file  Social History Narrative   Lives with husband who is also her business partner. They own and run a bar/club. Recent financial difficulties have been a significant source of stress for them. In addition, patient is grieving the loss   of a close family member/friend in Oct 2016. As a result, Donella reports poor diet for the past several months with notable unintentional weight loss.   Family History  Problem Relation Age of Onset  . Leukemia Mother 78       in remission  . Throat cancer Maternal Aunt        d.>50  . Alcohol abuse Maternal Uncle        d.>50   Scheduled Meds: . sodium chloride   Intravenous Once  . dexamethasone  6 mg Oral Daily  . feeding supplement (ENSURE ENLIVE)  237 mL Oral BID BM  . fentaNYL  25 mcg Transdermal Q72H  . HYDROmorphone   Intravenous Q4H  . lubiprostone  24 mcg Oral BID  . methadone  5 mg Oral BID  . pantoprazole  40 mg Oral Daily  . senna  4 tablet Oral Daily   Continuous Infusions: PRN Meds:.acetaminophen **OR** acetaminophen, acetaminophen, ALPRAZolam, diphenhydrAMINE **OR** diphenhydrAMINE, naloxone **AND** sodium chloride flush, ondansetron (ZOFRAN) IV, oxyCODONE, prochlorperazine Medications Prior to Admission:  Prior to Admission medications   Medication Sig Start Date End Date Taking? Authorizing Provider  clonazePAM (KLONOPIN) 0.5 MG tablet Take 0.5 mg by mouth daily as needed for anxiety.   Yes [provider]  gabapentin (NEURONTIN) 300 MG capsule Take 300 mg by mouth every 12 (twelve) hours.  02/05/18  Yes [provider]  lubiprostone (AMITIZA) 24 MCG capsule Take 24 mcg by mouth 2 (two) times daily.    Yes [provider]  methadone (DOLOPHINE) 10 MG tablet Take 10 mg by mouth every 12 (twelve) hours.    Yes [provider]  methocarbamol (ROBAXIN) 750 MG tablet Take 750 mg by mouth every 8 (eight) hours as needed for muscle spasms.  02/05/18  Yes [provider]  ondansetron (ZOFRAN-ODT) 8 MG disintegrating tablet Take 8 mg by mouth every 8 (eight) hours as needed for nausea or vomiting.   Yes [provider]  oxyCODONE (ROXICODONE) 15 MG immediate release tablet Take 15 mg by mouth every 4 (four) hours as needed for pain.   Yes [provider]  PRESCRIPTION MEDICATION Dilaudid (via pump): 1 ml every 15 minutes as needed for pain   Yes [provider]  Sennosides-Docusate Sodium (COLACE 2-IN-1 PO) Take 1 capsule by mouth 3 (three) times daily.   Yes [provider]  ALPRAZolam (XANAX) 0.5 MG tablet Take 1 tablet (0.5 mg total) by mouth at bedtime as needed for anxiety or sleep. Patient not taking: Reported on 02/11/2018 12/31/17   Akula, Vijaya, MD  dexamethasone (DECADRON) 6 MG tablet Take 1 tablet (6 mg total) by mouth daily. Patient not taking: Reported on 02/11/2018 01/01/18   Akula, Vijaya, MD  enoxaparin (LOVENOX) 60 MG/0.6ML injection Inject 0.5 mLs (50 mg total) into the skin every 12 (twelve) hours. Patient taking differently: Inject 50 mg into the skin daily.  12/31/17   Akula, Vijaya, MD  OVER THE COUNTER MEDICATION Inject 1 ampule as directed every other day. Viscum Abietis    [provider]  pantoprazole (PROTONIX) 40 MG tablet Take 1 tablet (40 mg total) by mouth daily. Patient not taking: Reported on 02/11/2018 12/31/17   Akula, Vijaya, MD  prochlorperazine (COMPAZINE) 10 MG tablet Take 1 tablet (10 mg total) by mouth every 6 (six) hours as needed for nausea or vomiting. Patient not taking: Reported on 02/11/2018 06/24/17    Gorsuch, Ni, MD  senna (SENOKOT) 8.6 MG TABS tablet Take   2 tablets (17.2 mg total) by mouth daily. Patient not taking: Reported on 02/11/2018 10/08/17   Vega, Orlando, MD   Allergies  Allergen Reactions  . Lenvima [Lenvatinib] Other (See Comments)    Seizures    Review of Systems  Musculoskeletal: Positive for back pain.    Physical Exam  Constitutional: She appears cachectic. She appears ill.  Cardiovascular: Tachycardia present.  Pulmonary/Chest: Tachypnea noted.  Skin: Skin is warm and dry.    Vital Signs: BP 126/84 (BP Location: Left Arm)   Pulse 96   Temp 98.3 F (36.8 C) (Oral)   Resp 17   Ht 5' 5" (1.651 m)   Wt 55.6 kg   SpO2 99%   BMI 20.40 kg/m  Pain Scale: 0-10   Pain Score: 10-Worst pain ever   SpO2: SpO2: 99 % O2 Device:SpO2: 99 % O2 Flow Rate: .   IO: Intake/output summary:   Intake/Output Summary (Last 24 hours) at 02/11/2018 1133 Last data filed at 02/10/2018 2300 Gross per 24 hour  Intake 500 ml  Output 1200 ml  Net -700 ml    LBM:   Baseline Weight: Weight: 55.6 kg Most recent weight: Weight: 55.6 kg     Palliative Assessment/Data: 30%   Discussed with Dr Alekh  Time In: 1415 Time Out: 1530 Time Total: 75 minutes Greater than 50%  of this time was spent counseling and coordinating care related to the above assessment and plan.  Signed by: Kerry Larach, NP   Please contact Palliative Medicine Team phone at [phone] for questions and concerns.  For individual provider: See Amion             

## 2018-02-11 NOTE — Progress Notes (Signed)
The patient was admitted with personal PCA pump prescribed by [facility]. The patient's sister, who is not at the hospital, was given instructions with a key how to disconnect and manage the pump. Pharmacy and the physician have been notified. Awaiting a nurse from ICU to look at the pump and see if we are able to disconnect with the instructions from the [facility].

## 2018-02-11 NOTE — Progress Notes (Signed)
Pt arrived w/ personal pca pump. md notified. md stated personal pca pump is ok use as long as pt is monitored w/ co2/o2 devices. Co2/o2 devices placed on pt. meds ok to administer as ordered

## 2018-02-11 NOTE — Progress Notes (Signed)
Kerry Morris stated the sh feel s so tired . She apologizes for "being a bother" I administered Xanax 0.5 . She is sobbing and stated that she was afraid she was going to die"

## 2018-02-11 NOTE — Consult Note (Signed)
Urology Consult   Physician requesting consult: Aline August  Reason for consult: Acute renal failure and bilateral hydroureteronephrosis  History of Present Illness: Kerry Morris is a 52 y.o. female with PMH significant for stage IV uterine cancer, anxiety, and depression who was admitted yesterday with c/o decreased UOP and LE edema.  She has been treated at [place] where she was recently discharged and told that additional chemo was not an option and palliative care was suggested.  She has a dilaudid PCA pump that she uses routinely.    On admission she was noted to have a Hg of 5.7, K 5.9, and Cr of 7.87 (baseline 0.8).  CT scan revealed evidence of local tumor recurrence in the pelvis and diffuse metastatic disease with a mass lesion between the rectum and bladder likely demonstrating direct invasion of the rectosigmoid colon and posterior bladder wall; bilateral ureteral stents are present with persistent bilateral hydronephrosis and hydroureter similar to prior study.   The pt is sleeping and although she does arouse to stimulation she cannot carry on a conversation.  All information is obtained from her mother who is at the bedside. Her mother states that the pt had not been complaining of increased pain in her back or lower abdomen prior to admission and she had not increased her use of the PCA.  The pt complained of LE edema and increased difficulty ambulating.  Per her mother she did not have gross hematuria, dysuria, or difficulty voiding.   Past Medical History:  Diagnosis Date  . Anxiety   . Anxiety and depression   . Cancer [facility])    uterine cancer  . Depression   . Genetic testing 01/10/2017   Ms. Sortino underwent genetic counseling and testing for hereditary cancer syndromes on 12/26/2016. Her results were negative for mutations in all 83 genes analyzed by [facility]'s 83-gene Multi-Cancers Panel. Genes analyzed include: ALK, APC, ATM, AXIN2, BAP1, BARD1, BLM,  BMPR1A, BRCA1, BRCA2, BRIP1, CASR, CDC73, CDH1, CDK4, CDKN1B, CDKN1C, CDKN2A, CEBPA, CHEK2, CTNNA1, DICER1, DIS3L2, EGFR, EPCAM, F    . Current Meds  Medication Sig  . clonazePAM (KLONOPIN) 0.5 MG tablet Take 0.5 mg by mouth daily as needed for anxiety.  . fentaNYL (DURAGESIC - DOSED MCG/HR) 100 MCG/HR Place 100 mcg onto the skin every 3 (three) days.  Marland Kitchen gabapentin (NEURONTIN) 300 MG capsule Take 300 mg by mouth every 12 (twelve) hours.   Marland Kitchen lubiprostone (AMITIZA) 24 MCG capsule Take 24 mcg by mouth 2 (two) times daily.   . methadone (DOLOPHINE) 10 MG tablet Take 10 mg by mouth every 12 (twelve) hours.   . methocarbamol (ROBAXIN) 750 MG tablet Take 750 mg by mouth every 8 (eight) hours as needed for muscle spasms.   . ondansetron (ZOFRAN-ODT) 8 MG disintegrating tablet Take 8 mg by mouth every 8 (eight) hours as needed for nausea or vomiting.  Marland Kitchen OVER THE COUNTER MEDICATION Inject 1 ampule as directed every other day. Viscum Abietis  . oxyCODONE (ROXICODONE) 15 MG immediate release tablet Take 15 mg by mouth every 4 (four) hours as needed for pain.  Marland Kitchen PRESCRIPTION MEDICATION Dilaudid (via pump): 1 ml every 15 minutes as needed for pain  . Sennosides-Docusate Sodium (COLACE 2-IN-1 PO) Take 1 capsule by mouth 3 (three) times daily.      Current Hospital Medications:  Home meds:  Current Meds  Medication Sig  . clonazePAM (KLONOPIN) 0.5 MG tablet Take 0.5 mg by mouth daily as needed for anxiety.  Marland Kitchen  fentaNYL (DURAGESIC - DOSED MCG/HR) 100 MCG/HR Place 100 mcg onto the skin every 3 (three) days.  Marland Kitchen gabapentin (NEURONTIN) 300 MG capsule Take 300 mg by mouth every 12 (twelve) hours.   Marland Kitchen lubiprostone (AMITIZA) 24 MCG capsule Take 24 mcg by mouth 2 (two) times daily.   . methadone (DOLOPHINE) 10 MG tablet Take 10 mg by mouth every 12 (twelve) hours.   . methocarbamol (ROBAXIN) 750 MG tablet Take 750 mg by mouth every 8 (eight) hours as needed for muscle spasms.   . ondansetron (ZOFRAN-ODT) 8 MG  disintegrating tablet Take 8 mg by mouth every 8 (eight) hours as needed for nausea or vomiting.  Marland Kitchen OVER THE COUNTER MEDICATION Inject 1 ampule as directed every other day. Viscum Abietis  . oxyCODONE (ROXICODONE) 15 MG immediate release tablet Take 15 mg by mouth every 4 (four) hours as needed for pain.  Marland Kitchen PRESCRIPTION MEDICATION Dilaudid (via pump): 1 ml every 15 minutes as needed for pain  . Sennosides-Docusate Sodium (COLACE 2-IN-1 PO) Take 1 capsule by mouth 3 (three) times daily.     Scheduled Meds: . sodium chloride   Intravenous Once  . dexamethasone  6 mg Oral Daily  . feeding supplement (ENSURE ENLIVE)  237 mL Oral BID BM  . fentaNYL  25 mcg Transdermal Q72H  . HYDROmorphone   Intravenous Q4H  . lubiprostone  24 mcg Oral BID  . methadone  5 mg Oral BID  . pantoprazole  40 mg Oral Daily  . senna  4 tablet Oral Daily   Continuous Infusions: PRN Meds:.acetaminophen **OR** acetaminophen, acetaminophen, ALPRAZolam, diphenhydrAMINE **OR** diphenhydrAMINE, naloxone **AND** sodium chloride flush, ondansetron (ZOFRAN) IV, oxyCODONE, prochlorperazine  Allergies:  Allergies  Allergen Reactions  . Lenvima [Lenvatinib] Other (See Comments)    Seizures     Family History  Problem Relation Age of Onset  . Leukemia Mother 47       in remission  . Throat cancer Maternal Aunt        d.>50  . Alcohol abuse Maternal Uncle        d.>50    Social History:  reports that she quit smoking about 2 years ago. Her smoking use included cigarettes. She has a 25.00 pack-year smoking history. She has never used smokeless tobacco. She reports that she has current or past drug history. Drug: Marijuana. She reports that she does not drink alcohol.  ROS: A complete review of systems could not be obtained due to pt ALOC.   Physical Exam:  Vital signs in last 24 hours: Temp:  [97.9 F (36.6 C)-98.6 F (37 C)] 98.3 F (36.8 C) (09/17 0607) Pulse Rate:  [96-117] 96 (09/17 0607) Resp:  [8-17] 17  (09/17 0607) BP: (111-149)/(72-101) 126/84 (09/17 0607) SpO2:  [95 %-100 %] 99 % (09/17 0607) Weight:  [55.6 kg] 55.6 kg (09/17 0231) Constitutional:  Sleepy; No acute distress Cardiovascular: Regular rate and rhythm Respiratory: Normal respiratory effort GI: Abdomen is soft, nontender, nondistended GU: foley cath in place with bright red urine in bag Lymphatic: No lymphadenopathy Neurologic: unable to assess Psychiatric: unable to assess  Laboratory Data:  Recent Labs    02/10/18 1840  WBC 7.0  HGB 5.8*  HCT 18.1*  PLT 294    Recent Labs    02/10/18 1840 02/11/18 0940  NA 129* 132*  K 5.9* 4.7  CL 94* 97*  GLUCOSE 97 82  BUN 73* 63*  CALCIUM 9.0 9.2  CREATININE 7.87* 4.96*     Results for  orders placed or performed during the hospital encounter of 02/10/18 (from the past 24 hour(s))  CBC with Differential/Platelet     Status: Abnormal   Collection Time: 02/10/18  6:40 PM  Result Value Ref Range   WBC 7.0 4.0 - 10.5 K/uL   RBC 1.90 (L) 3.87 - 5.11 MIL/uL   Hemoglobin 5.8 (LL) 12.0 - 15.0 g/dL   HCT 18.1 (L) 36.0 - 46.0 %   MCV 95.3 78.0 - 100.0 fL   MCH 30.5 26.0 - 34.0 pg   MCHC 32.0 30.0 - 36.0 g/dL   RDW 17.7 (H) 11.5 - 15.5 %   Platelets 294 150 - 400 K/uL   Neutrophils Relative % 76 %   Neutro Abs 5.2 1.7 - 7.7 K/uL   Lymphocytes Relative 14 %   Lymphs Abs 1.0 0.7 - 4.0 K/uL   Monocytes Relative 10 %   Monocytes Absolute 0.7 0.1 - 1.0 K/uL   Eosinophils Relative 0 %   Eosinophils Absolute 0.0 0.0 - 0.7 K/uL   Basophils Relative 0 %   Basophils Absolute 0.0 0.0 - 0.1 K/uL   Immature Granulocytes 0 %   Abs Immature Granulocytes 0.0 0.0 - 0.1 K/uL  Comprehensive metabolic panel     Status: Abnormal   Collection Time: 02/10/18  6:40 PM  Result Value Ref Range   Sodium 129 (L) 135 - 145 mmol/L   Potassium 5.9 (H) 3.5 - 5.1 mmol/L   Chloride 94 (L) 98 - 111 mmol/L   CO2 18 (L) 22 - 32 mmol/L   Glucose, Bld 97 70 - 99 mg/dL   BUN 73 (H) 6 - 20 mg/dL    Creatinine, Ser 7.87 (H) 0.44 - 1.00 mg/dL   Calcium 9.0 8.9 - 10.3 mg/dL   Total Protein 6.8 6.5 - 8.1 g/dL   Albumin 2.7 (L) 3.5 - 5.0 g/dL   AST 17 15 - 41 U/L   ALT 12 0 - 44 U/L   Alkaline Phosphatase 173 (H) 38 - 126 U/L   Total Bilirubin 1.2 0.3 - 1.2 mg/dL   GFR calc non Af Amer 5 (L) >60 mL/min   GFR calc Af Amer 6 (L) >60 mL/min   Anion gap 17 (H) 5 - 15  Ammonia     Status: None   Collection Time: 02/10/18  6:40 PM  Result Value Ref Range   Ammonia 23 9 - 35 umol/L  Urinalysis, Routine w reflex microscopic     Status: Abnormal   Collection Time: 02/10/18  7:23 PM  Result Value Ref Range   Color, Urine YELLOW YELLOW   APPearance HAZY (A) CLEAR   Specific Gravity, Urine 1.012 1.005 - 1.030   pH 5.0 5.0 - 8.0   Glucose, UA NEGATIVE NEGATIVE mg/dL   Hgb urine dipstick SMALL (A) NEGATIVE   Bilirubin Urine NEGATIVE NEGATIVE   Ketones, ur NEGATIVE NEGATIVE mg/dL   Protein, ur 100 (A) NEGATIVE mg/dL   Nitrite NEGATIVE NEGATIVE   Leukocytes, UA MODERATE (A) NEGATIVE   RBC / HPF 11-20 0 - 5 RBC/hpf   WBC, UA 21-50 0 - 5 WBC/hpf   Bacteria, UA RARE (A) NONE SEEN   Squamous Epithelial / LPF 0-5 0 - 5   WBC Clumps PRESENT    Mucus PRESENT    Non Squamous Epithelial 0-5 (A) NONE SEEN  Blood culture (routine x 2)     Status: None (Preliminary result)   Collection Time: 02/10/18  8:50 PM  Result Value Ref Range  Specimen Description BLOOD RIGHT ANTECUBITAL    Special Requests      BOTTLES DRAWN AEROBIC AND ANAEROBIC Blood Culture adequate volume   Culture      NO GROWTH < 24 HOURS Performed at [facility], [place] [address], [place], [place] [address]    Report Status PENDING   Blood culture (routine x 2)     Status: None (Preliminary result)   Collection Time: 02/10/18  8:52 PM  Result Value Ref Range   Specimen Description BLOOD LEFT HAND    Special Requests      BOTTLES DRAWN AEROBIC AND ANAEROBIC Blood Culture results may not be optimal due to an inadequate  volume of blood received in culture bottles   Culture      NO GROWTH < 24 HOURS Performed at [facility] [address] [place], [place] [address]    Report Status PENDING   ABO/Rh     Status: None   Collection Time: 02/11/18  1:23 AM  Result Value Ref Range   ABO/RH(D)      A POS Performed at [facility], [place] [address] [place], [place] [address]   Type and screen [place]     Status: None (Preliminary result)   Collection Time: 02/11/18  1:31 AM  Result Value Ref Range   ABO/RH(D) A POS    Antibody Screen NEG    Sample Expiration      02/14/2018 Performed at [facility], [place] [address], [place], [place] [address]    Unit Number [id number]    Blood Component Type RED CELLS,LR    Unit division 00    Status of Unit ALLOCATED    Transfusion Status OK TO TRANSFUSE    Crossmatch Result Compatible    Unit Number [id number]    Blood Component Type RED CELLS,LR    Unit division 00    Status of Unit ALLOCATED    Transfusion Status OK TO TRANSFUSE    Crossmatch Result Compatible    Unit Number [id number]    Blood Component Type RED CELLS,LR    Unit division 00    Status of Unit ALLOCATED    Transfusion Status OK TO TRANSFUSE    Crossmatch Result Compatible   Prepare RBC     Status: None   Collection Time: 02/11/18  1:31 AM  Result Value Ref Range   Order Confirmation      ORDER PROCESSED BY BLOOD BANK Performed at [facility], [place] [address], [place], [place] [address]   Comprehensive metabolic panel     Status: Abnormal   Collection Time: 02/11/18  9:40 AM  Result Value Ref Range   Sodium 132 (L) 135 - 145 mmol/L   Potassium 4.7 3.5 - 5.1 mmol/L   Chloride 97 (L) 98 - 111 mmol/L   CO2 20 (L) 22 - 32 mmol/L   Glucose, Bld 82 70 - 99 mg/dL   BUN 63 (H) 6 - 20 mg/dL   Creatinine, Ser 4.96 (H) 0.44 - 1.00 mg/dL   Calcium 9.2 8.9 - 10.3 mg/dL   Total Protein 7.3 6.5 - 8.1  g/dL   Albumin 2.9 (L) 3.5 - 5.0 g/dL   AST 15 15 - 41 U/L   ALT 12 0 - 44 U/L   Alkaline Phosphatase 164 (H) 38 - 126 U/L   Total Bilirubin 1.0 0.3 - 1.2 mg/dL   GFR calc non Af Amer 9 (L) >60 mL/min  GFR calc Af Amer 11 (L) >60 mL/min   Anion gap 15 5 - 15   Recent Results (from the past 240 hour(s))  Blood culture (routine x 2)     Status: None (Preliminary result)   Collection Time: 02/10/18  8:50 PM  Result Value Ref Range Status   Specimen Description BLOOD RIGHT ANTECUBITAL  Final   Special Requests   Final    BOTTLES DRAWN AEROBIC AND ANAEROBIC Blood Culture adequate volume   Culture   Final    NO GROWTH < 24 HOURS Performed at [facility], [place] [address] [place], [place] [address]    Report Status PENDING  Incomplete  Blood culture (routine x 2)     Status: None (Preliminary result)   Collection Time: 02/10/18  8:52 PM  Result Value Ref Range Status   Specimen Description BLOOD LEFT HAND  Final   Special Requests   Final    BOTTLES DRAWN AEROBIC AND ANAEROBIC Blood Culture results may not be optimal due to an inadequate volume of blood received in culture bottles   Culture   Final    NO GROWTH < 24 HOURS Performed at [facility], [place] [address], [place], [place] [address]    Report Status PENDING  Incomplete    Renal Function: Recent Labs    02/10/18 1840 02/11/18 0940  CREATININE 7.87* 4.96*   Estimated Creatinine Clearance: 11.6 mL/min (A) (by C-G formula based on SCr of 4.96 mg/dL (H)).  Radiologic Imaging: Ct Abdomen Pelvis Wo Contrast  Result Date: 02/10/2018 CLINICAL DATA:  Stage IV uterine cancer. Increasing pain over the past 24 hours. Unable to pass urine. Abdominal distention. EXAM: CT ABDOMEN AND PELVIS WITHOUT CONTRAST TECHNIQUE: Multidetector CT imaging of the abdomen and pelvis was performed following the standard protocol without IV contrast. COMPARISON:  12/27/2017 FINDINGS: Lower chest: Dependent changes in the lung bases. Small  esophageal hiatal hernia. Hepatobiliary: Noncontrast examination limits evaluation of solid organs but there appears to be multiple low-attenuation lesions throughout the liver consistent with metastatic disease. Difficult to compare with previous contrast-enhanced study, but I suspect progression since that previous study. Gallbladder and bile ducts are unremarkable. Pancreas: Unremarkable. No pancreatic ductal dilatation or surrounding inflammatory changes. Spleen: Normal in size without focal abnormality. Adrenals/Urinary Tract: No adrenal gland nodules. Bilateral ureteral stents are present. Proximal pigtails are positioned in the renal pelvis and distal pigtails in the bladder. There is prominent bilateral hydronephrosis and hydroureter. This is similar to previous study and may indicate non functioning of the stents or may be due to reflux. No stones are identified. The bladder wall is thickened, asymmetrically more prominent posteriorly. This is similar to previous study and may reflect tumor infiltration or radiation change. Bladder is not abnormally distended. Stomach/Bowel: Stomach, small bowel, and colon are mostly decompressed. No abnormal distention. There is contrast material throughout the colon. No wall thickening is appreciated. Vascular/Lymphatic: Enlarged mesenteric and retroperitoneal lymph nodes are present, better visualized on the previous contrast-enhanced study. Likely metastatic. Calcification of the aorta without aneurysm. Reproductive: Surgical resection of uterus and ovaries. There is a mass lesion in the low pelvis between the rectum and the bladder and surrounding the rectosigmoid colon junction. There is likely direct invasion of the rectosigmoid colon and of the posterior bladder. This mass is better demonstrated on the previous contrast-enhanced study. Pelvic lymphadenopathy bilaterally, likely metastatic. Nodular infiltration in the mesentery likely metastatic. Other: Diffuse edema  throughout the subcutaneous fatty tissues, progressing since previous  study. No free air or free fluid identified in the abdomen. Musculoskeletal: No destructive bone lesions are appreciated. IMPRESSION: 1. There is evidence of local tumor recurrence in the pelvis and diffuse metastatic disease with a mass lesion between the rectum and bladder, likely demonstrating direct invasion of the rectosigmoid colon and posterior bladder wall. Metastatic lymphadenopathy in the mesentery and retroperitoneum. Peritoneal tumor implants. Diffuse liver metastases, likely progressing. 2. Bilateral ureteral stents. Persistent bilateral hydronephrosis and hydroureter similar to prior study. This could be due to stent malfunction or reflux. 3. Asymmetric bladder wall thickening without bladder distention. This may indicate radiation change and/or tumor infiltration. 4. Diffuse edema throughout the subcutaneous fatty tissues is developing since the previous study. 5. No evidence of bowel obstruction although the rectosigmoid colon is narrowed by the tumor and there is residual contrast material throughout the colon. Electronically Signed   By: Lucienne Capers M.D.   On: 02/10/2018 22:01   Ct Head Wo Contrast  Result Date: 02/10/2018 CLINICAL DATA:  Stage IV uterine cancer, confusion. EXAM: CT HEAD WITHOUT CONTRAST TECHNIQUE: Contiguous axial images were obtained from the base of the skull through the vertex without intravenous contrast. COMPARISON:  12/16/2017. FINDINGS: Brain: No evidence for acute infarction, hemorrhage, mass lesion, hydrocephalus, or extra-axial fluid. Normal for age cerebral volume. Hypoattenuation of white matter could represent small vessel disease or post treatment effect. No areas of vasogenic edema. Vascular: No hyperdense vessel or unexpected calcification. Skull: Normal. Negative for fracture or focal lesion. Sinuses/Orbits: No acute finding. Other: None. IMPRESSION: No acute intracranial findings. No  findings concerning for metastatic disease on this noncontrast exam. Hypoattenuation of white matter could represent post treatment effect or small vessel disease, but the appearance is stable from July 2019. Electronically Signed   By: Staci Righter M.D.   On: 02/10/2018 19:50   US Renal  Result Date: 02/11/2018 CLINICAL DATA:  52 year old female with renal failure. Stage IV uterine cancer. Subsequent encounter. EXAM: RENAL / URINARY TRACT ULTRASOUND COMPLETE COMPARISON:  02/10/2018 CT. FINDINGS: Right Kidney: Length: 10.1 cm. Cortical thinning. Marked hydroureteronephrosis. Stent in place. Left Kidney: Length: 11.6 cm.  Marked hydroureteronephrosis.  Stent in place. Bladder: Decompressed with Foley catheter in place. Stent in place. Diffuse wall thickening. IMPRESSION: 1. Bilateral ureteral stents are in place. Marked bilateral hydroureteronephrosis. Right renal cortical thinning. 2. Decompressed urinary bladder with Foley catheter and stents in place. Diffuse wall thickening. Please see recent CT report. Electronically Signed   By: Genia Del M.D.   On: 02/11/2018 11:06   Dg Chest Port 1 View  Result Date: 02/10/2018 CLINICAL DATA:  Shortness of breath EXAM: PORTABLE CHEST 1 VIEW COMPARISON:  CT 12/27/2017, radiograph 05/01/2005 FINDINGS: Right-sided central venous port tip over the right atrium. No focal airspace disease. Cardiomediastinal silhouette within normal limits. No pneumothorax. IMPRESSION: No active disease. Electronically Signed   By: Donavan Foil M.D.   On: 02/10/2018 18:06    Impression/Recommendation: Acute renal failure--etiology is multifactorial including hydroureteronephrosis from reflux and progressively worsening tumor burden as well as probable urinary retention.  Repeat renal U/S and labs were performed after foley placement.  This showed no change in bilateral hydro but her Cr improved from 7.87 to 4.96.  Her stents are in good position and her hydro is not significantly  different from her previous CT performed in August at which time her Cr was 0.7.  Maintain foley and monitor Cr.  She should have a void trial after resolution of acute kidney issues.  If  she fails void trial (which she might due to narcotics and decreased ambulation) she may require indwelling foley vs I/O cathing.  Do not recommend neph tubes at this time.    Hematuria--multiple contributing factors including stents, foley placement, and over distention of bladder. UA was negative on admission.  Monitor and irrigate foley prn to prevent clotting.   Pt and various family members appear to have some different opinions/ideas on how to proceed with pt's care.  Hopefully palliative care will able to assist in getting everyone on the same path.   Debbrah Alar 02/11/2018, 12:03 PM

## 2018-02-11 NOTE — Progress Notes (Signed)
Patient ID: Kerry Morris, female   DOB: 1965-10-18, 52 y.o.   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  Patient presented with generalized weakness and edema.  Her hemoglobin was 5.7 and creatinine was 7.87.   Assessment & Plan:   Principal Problem:   Symptomatic anemia Active Problems:   Endometrial cancer (HCC)   Weight loss   Bilateral hydronephrosis   Anemia   Hyperkalemia   Acute kidney injury ([facility])   Acute renal failure with hyperkalemia -Probably from obstructive uropathy from cancer.  CT abdomen and pelvis showed bilateral hydronephrosis and hydroureter, due to stent malfunction or reflux along with progressively worsening tumor. -Patient has a Foley's catheter.  I have spoken to Dr. Herrick/urology who recommends a renal ultrasound to see if hydronephrosis has improved after Foley catheter placement.  Urology will evaluate the patient as well but urology is of the opinion that nephrostomy tubes is not an option for this patient with advanced cancer and metastases. -Repeat stat BMP to follow-up on creatinine and potassium.  If potassium still high, might need to use Kayexalate  Acute on chronic anemia -Probably from renal failure and cancer.  Packed red cells transfusion have been ordered on admission which is pending.  Repeat hemoglobin after transfusion  Stage IV endometrial cancer with metastases with generalized debility -Patient's family was recently told at [facility] that palliative approach is the best for the patient and no more chemotherapy was recommended.   Palliative care has been consulted.  Patient's overall prognosis is very poor.  Continue PCA pump for pain control.  Patient should be made comfort measures/hospice.  Might benefit from residential hospice.  Hyponatremia -Probably secondary renal failure.  Repeat stat labs   DVT prophylaxis: SCDs Code Status: DNR Family Communication: Spoke to husband and mother at bedside Disposition Plan: Depends on clinical outcome  Consultants: Palliative care/urology  Procedures: None  Antimicrobials: None   Subjective: Patient seen and examined at bedside.  She is sleeping, hardly wakes up on calling her name.  Spoke to husband at bedside.  No overnight fever or vomiting reported.  Objective: Vitals:   02/10/18 2334 02/11/18 0000 02/11/18 0231 02/11/18 0607  BP:  (!) 122/101  126/84  Pulse: (!) 114 (!) 117 (!) 116 96  Resp:  16 12 17   Temp:  98.6 F (37 C)  98.3 F (36.8 C)  TempSrc:    Oral  SpO2: 99% 98%  99%  Weight:   55.6 kg   Height:   5\' 5"  (1.651 m)     Intake/Output Summary (Last 24 hours) at 02/11/2018 0930 Last data filed at 02/10/2018 2300 Gross per 24 hour  Intake 500 ml  Output 1200 ml  Net -700 ml   Filed Weights   02/11/18 0231  Weight: 55.6 kg    Examination:  General exam: Appears very thinly built and ill looking. Respiratory system: Bilateral decreased breath sounds at bases with some scattered crackles Cardiovascular system: S1 & S2 heard, tachycardic Gastrointestinal system: Abdomen is nondistended, soft and nontender. Normal bowel sounds heard. Extremities: No cyanosis, clubbing; 2+ edema Central nervous  system: Hardly wakes up on calling her name.  No obvious focal neurologic deficit Skin: No rashes, lesions or ulcers Psychiatry: Could not be assessed because of mental status    Data Reviewed: I have personally reviewed following labs and imaging studies  CBC: Recent Labs  Lab 02/10/18 1840  WBC 7.0  NEUTROABS 5.2  HGB 5.8*  HCT 18.1*    MCV 95.3  PLT 235   Basic Metabolic Panel: Recent Labs  Lab 02/10/18 1840  NA 129*  K 5.9*  CL 94*  CO2 18*  GLUCOSE 97  BUN 73*  CREATININE 7.87*  CALCIUM 9.0   GFR: Estimated Creatinine Clearance: 7.3 mL/min (A) (by C-G formula based on SCr of 7.87 mg/dL (H)). Liver Function Tests: Recent Labs  Lab 02/10/18 1840  AST 17  ALT 12  ALKPHOS 173*  BILITOT 1.2  PROT 6.8  ALBUMIN 2.7*   No results for input(s): LIPASE, AMYLASE in the last 168 hours. Recent Labs  Lab 02/10/18 1840  AMMONIA 23   Coagulation Profile: No results for input(s): INR, PROTIME in the last 168 hours. Cardiac Enzymes: No results for input(s): CKTOTAL, CKMB, CKMBINDEX, TROPONINI in the last 168 hours. BNP (last 3 results) No results for input(s): PROBNP in the last 8760 hours. HbA1C: No results for input(s): HGBA1C in the last 72 hours. CBG: No results for input(s): GLUCAP in the last 168 hours. Lipid Profile: No results for input(s): CHOL, HDL, LDLCALC, TRIG, CHOLHDL, LDLDIRECT in the last 72 hours. Thyroid Function Tests: No results for input(s): TSH, T4TOTAL, FREET4, T3FREE, THYROIDAB in the last 72 hours. Anemia Panel: No results for input(s): VITAMINB12, FOLATE, FERRITIN, TIBC, IRON, RETICCTPCT in the last 72 hours. Sepsis Labs: No results for input(s): PROCALCITON, LATICACIDVEN in the last 168 hours.  No results found for this or any previous visit (from the past 240 hour(s)).       Radiology Studies: Ct Abdomen Pelvis Wo Contrast  Result Date: 02/10/2018 CLINICAL DATA:  Stage IV uterine cancer. Increasing pain over the past 24 hours. Unable to pass urine. Abdominal distention. EXAM: CT ABDOMEN AND PELVIS WITHOUT CONTRAST TECHNIQUE: Multidetector CT imaging of the abdomen and pelvis was performed following the standard protocol without IV contrast. COMPARISON:  12/27/2017 FINDINGS: Lower chest: Dependent changes in the lung bases. Small esophageal hiatal hernia. Hepatobiliary:  Noncontrast examination limits evaluation of solid organs but there appears to be multiple low-attenuation lesions throughout the liver consistent with metastatic disease. Difficult to compare with previous contrast-enhanced study, but I suspect progression since that previous study. Gallbladder and bile ducts are unremarkable. Pancreas: Unremarkable. No pancreatic ductal dilatation or surrounding inflammatory changes. Spleen: Normal in size without focal abnormality. Adrenals/Urinary Tract: No adrenal gland nodules. Bilateral ureteral stents are present. Proximal pigtails are positioned in the renal pelvis and distal pigtails in the bladder. There is prominent bilateral hydronephrosis and hydroureter. This is similar to previous study and may indicate non functioning of the stents or may be due to reflux. No stones are identified. The bladder wall is thickened, asymmetrically more prominent posteriorly. This is similar to previous study and may reflect tumor infiltration or radiation change. Bladder is not abnormally distended. Stomach/Bowel: Stomach, small bowel, and colon are mostly decompressed. No abnormal distention. There is contrast material throughout the colon. No wall thickening is appreciated. Vascular/Lymphatic: Enlarged mesenteric and retroperitoneal lymph nodes are present, better visualized on the previous contrast-enhanced study. Likely metastatic. Calcification of the aorta without aneurysm. Reproductive: Surgical resection of uterus and ovaries. There is  a mass lesion in the low pelvis between the rectum and the bladder and surrounding the rectosigmoid colon junction. There is likely direct invasion of the rectosigmoid colon and of the posterior bladder. This mass is better demonstrated on the previous contrast-enhanced study. Pelvic lymphadenopathy bilaterally, likely metastatic. Nodular infiltration in the mesentery likely metastatic. Other: Diffuse edema throughout the subcutaneous fatty  tissues, progressing since previous study. No free air or free fluid identified in the abdomen. Musculoskeletal: No destructive bone lesions are appreciated. IMPRESSION: 1. There is evidence of local tumor recurrence in the pelvis and diffuse metastatic disease with a mass lesion between the rectum and bladder, likely demonstrating direct invasion of the rectosigmoid colon and posterior bladder wall. Metastatic lymphadenopathy in the mesentery and retroperitoneum. Peritoneal tumor implants. Diffuse liver metastases, likely progressing. 2. Bilateral ureteral stents. Persistent bilateral hydronephrosis and hydroureter similar to prior study. This could be due to stent malfunction or reflux. 3. Asymmetric bladder wall thickening without bladder distention. This may indicate radiation change and/or tumor infiltration. 4. Diffuse edema throughout the subcutaneous fatty tissues is developing since the previous study. 5. No evidence of bowel obstruction although the rectosigmoid colon is narrowed by the tumor and there is residual contrast material throughout the colon. Electronically Signed   By: Lucienne Capers M.D.   On: 02/10/2018 22:01   Ct Head Wo Contrast  Result Date: 02/10/2018 CLINICAL DATA:  Stage IV uterine cancer, confusion. EXAM: CT HEAD WITHOUT CONTRAST TECHNIQUE: Contiguous axial images were obtained from the base of the skull through the vertex without intravenous contrast. COMPARISON:  12/16/2017. FINDINGS: Brain: No evidence for acute infarction, hemorrhage, mass lesion, hydrocephalus, or extra-axial fluid. Normal for age cerebral volume. Hypoattenuation of white matter could represent small vessel disease or post treatment effect. No areas of vasogenic edema. Vascular: No hyperdense vessel or unexpected calcification. Skull: Normal. Negative for fracture or focal lesion. Sinuses/Orbits: No acute finding. Other: None. IMPRESSION: No acute intracranial findings. No findings concerning for metastatic  disease on this noncontrast exam. Hypoattenuation of white matter could represent post treatment effect or small vessel disease, but the appearance is stable from July 2019. Electronically Signed   By: Staci Righter M.D.   On: 02/10/2018 19:50   Dg Chest Port 1 View  Result Date: 02/10/2018 CLINICAL DATA:  Shortness of breath EXAM: PORTABLE CHEST 1 VIEW COMPARISON:  CT 12/27/2017, radiograph 05/01/2005 FINDINGS: Right-sided central venous port tip over the right atrium. No focal airspace disease. Cardiomediastinal silhouette within normal limits. No pneumothorax. IMPRESSION: No active disease. Electronically Signed   By: Donavan Foil M.D.   On: 02/10/2018 18:06        Scheduled Meds: . sodium chloride   Intravenous Once  . dexamethasone  6 mg Oral Daily  . feeding supplement (ENSURE ENLIVE)  237 mL Oral BID BM  . fentaNYL  25 mcg Transdermal Q72H  . lubiprostone  24 mcg Oral BID  . methadone  5 mg Oral BID  . pantoprazole  40 mg Oral Daily  . senna  4 tablet Oral Daily   Continuous Infusions:   LOS: 1 day        Aline August, MD [facility] Pager [phone]  If 7PM-7AM, please contact night-coverage [email or web address] Password [email or web address] 02/11/2018, 9:30 AM

## 2018-02-12 DIAGNOSIS — R634 Abnormal weight loss: Secondary | ICD-10-CM

## 2018-02-12 DIAGNOSIS — F411 Generalized anxiety disorder: Secondary | ICD-10-CM

## 2018-02-12 LAB — TYPE AND SCREEN
ABO/RH(D): A POS
Antibody Screen: NEGATIVE
UNIT DIVISION: 0
Unit division: 0
Unit division: 0

## 2018-02-12 LAB — BPAM RBC
BLOOD PRODUCT EXPIRATION DATE: 201910072359
Blood Product Expiration Date: 201910062359
Blood Product Expiration Date: 201910072359
ISSUE DATE / TIME: 201909171213
ISSUE DATE / TIME: 201909172252
ISSUE DATE / TIME: 201909171649
Unit Type and Rh: 6200
Unit Type and Rh: 6200
Unit Type and Rh: 6200

## 2018-02-12 LAB — CBC WITH DIFFERENTIAL/PLATELET
Basophils Absolute: 0 10*3/uL (ref 0.0–0.1)
Basophils Relative: 0 %
Eosinophils Absolute: 0 10*3/uL (ref 0.0–0.7)
Eosinophils Relative: 0 %
HCT: 34.9 % — ABNORMAL LOW (ref 36.0–46.0)
HEMOGLOBIN: 12 g/dL (ref 12.0–15.0)
LYMPHS ABS: 1.2 10*3/uL (ref 0.7–4.0)
Lymphocytes Relative: 14 %
MCH: 30.5 pg (ref 26.0–34.0)
MCHC: 34.4 g/dL (ref 30.0–36.0)
MCV: 88.6 fL (ref 78.0–100.0)
Monocytes Absolute: 0.8 10*3/uL (ref 0.1–1.0)
Monocytes Relative: 9 %
NEUTROS ABS: 6.4 10*3/uL (ref 1.7–7.7)
NEUTROS PCT: 77 %
Platelets: 262 10*3/uL (ref 150–400)
RBC: 3.94 MIL/uL (ref 3.87–5.11)
RDW: 17 % — ABNORMAL HIGH (ref 11.5–15.5)
WBC: 8.4 10*3/uL (ref 4.0–10.5)

## 2018-02-12 LAB — BASIC METABOLIC PANEL
Anion gap: 14 (ref 5–15)
BUN: 39 mg/dL — AB (ref 6–20)
CHLORIDE: 96 mmol/L — AB (ref 98–111)
CO2: 23 mmol/L (ref 22–32)
Calcium: 9.4 mg/dL (ref 8.9–10.3)
Creatinine, Ser: 2.41 mg/dL — ABNORMAL HIGH (ref 0.44–1.00)
GFR calc Af Amer: 25 mL/min — ABNORMAL LOW (ref >60)
GFR calc non Af Amer: 22 mL/min — ABNORMAL LOW (ref >60)
GLUCOSE: 124 mg/dL — AB (ref 70–99)
POTASSIUM: 4 mmol/L (ref 3.5–5.1)
SODIUM: 133 mmol/L — AB (ref 135–145)

## 2018-02-12 MED ORDER — FENTANYL 100 MCG/HR TD PT72: 100.0000 ug | MEDICATED_PATCH | TRANSDERMAL | Status: DC

## 2018-02-12 MED ORDER — METHADONE HCL 5 MG PO TABS
5.0000 mg | ORAL_TABLET | Freq: Two times a day (BID) | ORAL | Status: DC
  Administered 2018-02-12 – 2018-02-13 (×3): 5 mg via ORAL
  Filled 2018-02-12 (×3): qty 1

## 2018-02-12 MED ORDER — ALPRAZOLAM 1 MG PO TABS
1.0000 mg | ORAL_TABLET | Freq: Four times a day (QID) | ORAL | Status: DC
  Administered 2018-02-12 – 2018-02-13 (×5): 1 mg via ORAL
  Administered 2018-02-14: 0.5 mg via ORAL
  Administered 2018-02-14 – 2018-02-15 (×6): 1 mg via ORAL
  Filled 2018-02-12 (×5): qty 2
  Filled 2018-02-12: qty 1
  Filled 2018-02-12 (×3): qty 2
  Filled 2018-02-12: qty 1
  Filled 2018-02-12 (×2): qty 2
  Filled 2018-02-12: qty 1
  Filled 2018-02-12 (×4): qty 2
  Filled 2018-02-12: qty 1

## 2018-02-12 MED ORDER — HYDROMORPHONE 1 MG/ML IV SOLN: INTRAVENOUS | Status: DC

## 2018-02-12 MED ORDER — HYDROMORPHONE 1 MG/ML IV SOLN
INTRAVENOUS | Status: DC
  Administered 2018-02-12: 10.14 mg via INTRAVENOUS

## 2018-02-12 MED ORDER — HYDROMORPHONE 1 MG/ML IV SOLN
INTRAVENOUS | Status: DC
  Administered 2018-02-12: 17:00:00 via INTRAVENOUS
  Administered 2018-02-12: 4.9 mg via INTRAVENOUS
  Administered 2018-02-12: 14.16 mg via INTRAVENOUS
  Administered 2018-02-13: 17.39 mg via INTRAVENOUS
  Administered 2018-02-13: 13.93 mg via INTRAVENOUS
  Administered 2018-02-13: 8.97 mg via INTRAVENOUS
  Administered 2018-02-13: 13.62 mg via INTRAVENOUS
  Administered 2018-02-13: 1 mL via INTRAVENOUS
  Administered 2018-02-13: 11.5 mg via INTRAVENOUS
  Administered 2018-02-13 (×2): via INTRAVENOUS
  Administered 2018-02-14: 28.83 mg via INTRAVENOUS
  Administered 2018-02-14: 8.82 mg via INTRAVENOUS
  Administered 2018-02-14 (×2): via INTRAVENOUS
  Administered 2018-02-14: 12.39 mg via INTRAVENOUS
  Administered 2018-02-14: 12.29 mg via INTRAVENOUS
  Administered 2018-02-14: 6 mg via INTRAVENOUS
  Administered 2018-02-15: 10.31 mg via INTRAVENOUS
  Administered 2018-02-15: 13.13 mg via INTRAVENOUS
  Administered 2018-02-15: 05:00:00 via INTRAVENOUS
  Administered 2018-02-15: 11.57 mg via INTRAVENOUS
  Administered 2018-02-15: 12:00:00 via INTRAVENOUS
  Filled 2018-02-12 (×9): qty 25

## 2018-02-12 MED ORDER — GABAPENTIN 300 MG PO CAPS
600.0000 mg | ORAL_CAPSULE | Freq: Two times a day (BID) | ORAL | Status: DC
  Administered 2018-02-12 – 2018-02-15 (×5): 600 mg via ORAL
  Filled 2018-02-12 (×6): qty 2

## 2018-02-12 MED ORDER — BELLADONNA ALKALOIDS-OPIUM 16.2-60 MG RE SUPP: 1.0000 | Freq: Four times a day (QID) | RECTAL | Status: DC | PRN

## 2018-02-12 NOTE — Progress Notes (Signed)
Patient ID: Kerry Morris, female   DOB: 02-Dec-1965, 52 y.o.   MRN: [id number]  This NP visited patient at the bedside as a follow up to  yesterday's [place].  Mother at bedsdie  Patient continues to have generalized discomfort.  She has a lot of rectal pressure and swelling in labial area.  She did have a large BM last night.  Mother stepped out to give the patient privacy to talk to me. Created space and opportunity for [name] to explore thoughts and feeling regarding current medical situation She was able to engage with me regarding her current medicial situation "I know I'm dying".  She shared with me her main worry is regarding the welfare of her husband, "it will be so hard without me"   She clearly expresses her desire for comfort "even if it means I sleep all the time".  She verbalizes a lot of anxiety around her relationship with her mother, they were estranged for many years. She request that we talk again later about this situation.  Patient shared with me  her all encompassing belief systems of all religions and spiritual practices "Omnism" in which she seeks strength from all.  Was able to get patient out of bed to chair and BSC.  Guided patient through relaxation breathing along with gentle massage. She expresses that some of her moaning is actually a self-soothing technique.  In attempt to streamline pain mediations and enhance her comfort level -Increase Dilaudid basel rate to 2 mg/hr -increase Dilaudid prn dose to 1.5 every 10 minutes -lock out dose of 7 mg -dc Fentanyl patch -decrease Methadone to 5mg  po bid as we begin to wean off - increase Xanax 1 mg every 6 hrs scheduled  -increase Neuronin to 600 mg bid   I placed a call to husband /Hugo to discuss today's events.  He supports a comfort path for [name].  We will meet in the morning to discuss transition of care, family is leaning toward  residential hospice.  Emotional support offered to patient and her family.  Meet  outside the room with mother and created space for her to verbalize her feeling and thoughts with this difficult situation.  Questions and concerns addressed   Discussed with Dr Ree Kida  Total time spent on the unit was 90 minutes  Greater than 50% of the time was spent in counseling and coordination of care  Wadie Lessen NP  Palliative Medicine Team Team Phone # [phone] Pager [phone]

## 2018-02-12 NOTE — Progress Notes (Signed)
Nutrition Brief Note  Patient identified on the Malnutrition Screening Tool (MST) Report  Per Palliative care note, pt with prognosis of <4 weeks.  No nutrition interventions warranted at this time.  Clayton Bibles, MS, RD, [place] Dietitian Pager: [phone] After Hours Pager: [phone]

## 2018-02-12 NOTE — Progress Notes (Signed)
PROGRESS NOTE    Kerry Morris  GEX:[id number] DOB: 11-25-1965 DOA: 02/10/2018 PCP: System, Pcp Not In   Brief Narrative:  HPI On 02/10/2018 by Dr. Gala Romney Kerry Morris is a 52 y.o. 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  Patient was apparently discharged today from there and was told there is no more chemo to be given.  Palliative care was suggested.  They came back home today and was brought to the ER due to significant swelling all over.  Patient also has hemoglobin 5.7.  Patient and family are talking about helping patient to get better in terms of comfort.  They appear to be more confused as to what they actually want.  No more chemo and the understand palliation will be the next option.  She is however having significant edema and renal failure with the anemia.  Patient is being admitted with the goals of mainly palliative treatment.  Patient and her husband are asking for nephrostomy tube.  Records have been obtained from [facility] to have a better idea what the prognosis is and care plan was..  Assessment & Plan   Acute renal failure with hyperkalemia -Suspect secondary to obstructive uropathy from cancer -CT abdomen pelvis showed bilateral hydronephrosis and hydroureter, due to stent malfunction or reflux along with progressively worsening tumor -Foley catheter placed -Urology consulted and appreciated recommended renal ultrasound to see if hydronephrosis is improved after Foley catheter placement -Urology did not feel that nephrostomy tubes were an option for this patient given her advanced cancer metastasis -Creatinine on admission 7.87, currently 2.41  Acute on chronic anemia -Likely secondary to renal failure and cancer -Hemoglobin is 5.8 on admission -Transfused PRBC, hemoglobin  currently 12  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 -Family was recently told by [facility] that palliative approach is best for this patient and that further chemotherapy was not recommended -Palliative care consulted and appreciated -Patient's overall prognosis is very poor -Continue PCA pump for pain control -Patient would benefit from residential hospice  Hyponatremia -Suspect secondary to renal failure  Malnourished -Likely secondary to cancer -nutrition was consulted, however no interventions warranted at this time given prognosis <4wks per palliative care   DVT Prophylaxis  SCDs  Code Status: DNR  Family Communication: Mother at bedside  Disposition Plan: Admitted, pending pain control and further recommendations from palliative care  Consultants Palliative care Urology  Procedures  None  Antibiotics   Anti-infectives (From admission, onward)   None      Subjective:   [name] seen and examined today.  She has no new complaints today.  Complains of pain everywhere. Denies current chest pain, shortness breath, abdominal pain, nausea vomiting, diarrhea constipation.  Objective:   Vitals:   02/12/18 0527 02/12/18 0610 02/12/18 0802 02/12/18 1111  BP:  (!) 150/103    Pulse:  (!) 111    Resp: 18 18 (!) 22 18  Temp:  97.9 F (36.6 C)    TempSrc:  Oral    SpO2: 94% 98% 96% 94%  Weight:      Height:        Intake/Output Summary (Last 24 hours) at 02/12/2018 1545 Last data filed at 02/12/2018 1100 Gross per 24 hour  Intake 1150 ml  Output 1475 ml  Net -325 ml   [employer]  02/11/18 0231  Weight: 55.6 kg    Exam  General: Well developed, ill-appearing, NAD  HEENT: NCAT, mucous membranes moist.   Neck: Supple  Cardiovascular: S1 S2 auscultated, no rubs, murmurs or gallops. Regular rate and rhythm.  Respiratory: Clear to auscultation bilaterally with equal chest rise  Abdomen: Soft,  nontender, nondistended, + bowel sounds  Extremities: warm dry without cyanosis clubbing or edema  Neuro: AAOx3, nonfocal  Psych: Appropriate mood and affect   Data Reviewed: I have personally reviewed following labs and imaging studies  CBC: Recent Labs  Lab 02/10/18 1840 02/11/18 2239 02/12/18 0533  WBC 7.0 4.5 8.4  NEUTROABS 5.2  --  6.4  HGB 5.8* 9.7* 12.0  HCT 18.1* 28.6* 34.9*  MCV 95.3 89.4 88.6  PLT 294 253 161   Basic Metabolic Panel: Recent Labs  Lab 02/10/18 1840 02/11/18 0940 02/12/18 0533  NA 129* 132* 133*  K 5.9* 4.7 4.0  CL 94* 97* 96*  CO2 18* 20* 23  GLUCOSE 97 82 124*  BUN 73* 63* 39*  CREATININE 7.87* 4.96* 2.41*  CALCIUM 9.0 9.2 9.4   GFR: Estimated Creatinine Clearance: 24 mL/min (A) (by C-G formula based on SCr of 2.41 mg/dL (H)). Liver Function Tests: Recent Labs  Lab 02/10/18 1840 02/11/18 0940  AST 17 15  ALT 12 12  ALKPHOS 173* 164*  BILITOT 1.2 1.0  PROT 6.8 7.3  ALBUMIN 2.7* 2.9*   No results for input(s): LIPASE, AMYLASE in the last 168 hours. Recent Labs  Lab 02/10/18 1840  AMMONIA 23   Coagulation Profile: No results for input(s): INR, PROTIME in the last 168 hours. Cardiac Enzymes: No results for input(s): CKTOTAL, CKMB, CKMBINDEX, TROPONINI in the last 168 hours. BNP (last 3 results) No results for input(s): PROBNP in the last 8760 hours. HbA1C: No results for input(s): HGBA1C in the last 72 hours. CBG: No results for input(s): GLUCAP in the last 168 hours. Lipid Profile: No results for input(s): CHOL, HDL, LDLCALC, TRIG, CHOLHDL, LDLDIRECT in the last 72 hours. Thyroid Function Tests: No results for input(s): TSH, T4TOTAL, FREET4, T3FREE, THYROIDAB in the last 72 hours. Anemia Panel: No results for input(s): VITAMINB12, FOLATE, FERRITIN, TIBC, IRON, RETICCTPCT in the last 72 hours. Urine analysis:    Component Value Date/Time   COLORURINE YELLOW 02/10/2018 1923   APPEARANCEUR HAZY (A) 02/10/2018 1923    LABSPEC 1.012 02/10/2018 1923   LABSPEC 1.005 03/12/2017 1011   PHURINE 5.0 02/10/2018 1923   GLUCOSEU NEGATIVE 02/10/2018 1923   GLUCOSEU Negative 03/12/2017 1011   HGBUR SMALL (A) 02/10/2018 1923   BILIRUBINUR NEGATIVE 02/10/2018 1923   BILIRUBINUR Negative 03/12/2017 1011   KETONESUR NEGATIVE 02/10/2018 1923   PROTEINUR 100 (A) 02/10/2018 1923   UROBILINOGEN 0.2 03/12/2017 1011   NITRITE NEGATIVE 02/10/2018 1923   LEUKOCYTESUR MODERATE (A) 02/10/2018 1923   LEUKOCYTESUR Trace 03/12/2017 1011   Sepsis Labs: @LABRCNTIP (procalcitonin:4,lacticidven:4)  ) Recent Results (from the past 240 hour(s))  Blood culture (routine x 2)     Status: None (Preliminary result)   Collection Time: 02/10/18  8:50 PM  Result Value Ref Range Status   Specimen Description BLOOD RIGHT ANTECUBITAL  Final   Special Requests   Final    BOTTLES DRAWN AEROBIC AND ANAEROBIC Blood Culture adequate volume   Culture   Final    NO GROWTH 2 DAYS Performed at [facility], [place] [address], [place], [place] [address]    Report Status PENDING  Incomplete  Blood culture (routine  x 2)     Status: None (Preliminary result)   Collection Time: 02/10/18  8:52 PM  Result Value Ref Range Status   Specimen Description BLOOD LEFT HAND  Final   Special Requests   Final    BOTTLES DRAWN AEROBIC AND ANAEROBIC Blood Culture results may not be optimal due to an inadequate volume of blood received in culture bottles   Culture   Final    NO GROWTH 2 DAYS Performed at [facility], [place] [address] [place], [place] [address]    Report Status PENDING  Incomplete      Radiology Studies: Ct Abdomen Pelvis Wo Contrast  Result Date: 02/10/2018 CLINICAL DATA:  Stage IV uterine cancer. Increasing pain over the past 24 hours. Unable to pass urine. Abdominal distention. EXAM: CT ABDOMEN AND PELVIS WITHOUT CONTRAST TECHNIQUE: Multidetector CT imaging of the abdomen and pelvis was performed following the standard protocol  without IV contrast. COMPARISON:  12/27/2017 FINDINGS: Lower chest: Dependent changes in the lung bases. Small esophageal hiatal hernia. Hepatobiliary: Noncontrast examination limits evaluation of solid organs but there appears to be multiple low-attenuation lesions throughout the liver consistent with metastatic disease. Difficult to compare with previous contrast-enhanced study, but I suspect progression since that previous study. Gallbladder and bile ducts are unremarkable. Pancreas: Unremarkable. No pancreatic ductal dilatation or surrounding inflammatory changes. Spleen: Normal in size without focal abnormality. Adrenals/Urinary Tract: No adrenal gland nodules. Bilateral ureteral stents are present. Proximal pigtails are positioned in the renal pelvis and distal pigtails in the bladder. There is prominent bilateral hydronephrosis and hydroureter. This is similar to previous study and may indicate non functioning of the stents or may be due to reflux. No stones are identified. The bladder wall is thickened, asymmetrically more prominent posteriorly. This is similar to previous study and may reflect tumor infiltration or radiation change. Bladder is not abnormally distended. Stomach/Bowel: Stomach, small bowel, and colon are mostly decompressed. No abnormal distention. There is contrast material throughout the colon. No wall thickening is appreciated. Vascular/Lymphatic: Enlarged mesenteric and retroperitoneal lymph nodes are present, better visualized on the previous contrast-enhanced study. Likely metastatic. Calcification of the aorta without aneurysm. Reproductive: Surgical resection of uterus and ovaries. There is a mass lesion in the low pelvis between the rectum and the bladder and surrounding the rectosigmoid colon junction. There is likely direct invasion of the rectosigmoid colon and of the posterior bladder. This mass is better demonstrated on the previous contrast-enhanced study. Pelvic  lymphadenopathy bilaterally, likely metastatic. Nodular infiltration in the mesentery likely metastatic. Other: Diffuse edema throughout the subcutaneous fatty tissues, progressing since previous study. No free air or free fluid identified in the abdomen. Musculoskeletal: No destructive bone lesions are appreciated. IMPRESSION: 1. There is evidence of local tumor recurrence in the pelvis and diffuse metastatic disease with a mass lesion between the rectum and bladder, likely demonstrating direct invasion of the rectosigmoid colon and posterior bladder wall. Metastatic lymphadenopathy in the mesentery and retroperitoneum. Peritoneal tumor implants. Diffuse liver metastases, likely progressing. 2. Bilateral ureteral stents. Persistent bilateral hydronephrosis and hydroureter similar to prior study. This could be due to stent malfunction or reflux. 3. Asymmetric bladder wall thickening without bladder distention. This may indicate radiation change and/or tumor infiltration. 4. Diffuse edema throughout the subcutaneous fatty tissues is developing since the previous study. 5. No evidence of bowel obstruction although the rectosigmoid colon is narrowed by the tumor and there is residual contrast material throughout the colon. Electronically Signed   By: Gwyndolyn Saxon  Gerilyn Nestle M.D.   On: 02/10/2018 22:01   Ct Head Wo Contrast  Result Date: 02/10/2018 CLINICAL DATA:  Stage IV uterine cancer, confusion. EXAM: CT HEAD WITHOUT CONTRAST TECHNIQUE: Contiguous axial images were obtained from the base of the skull through the vertex without intravenous contrast. COMPARISON:  12/16/2017. FINDINGS: Brain: No evidence for acute infarction, hemorrhage, mass lesion, hydrocephalus, or extra-axial fluid. Normal for age cerebral volume. Hypoattenuation of white matter could represent small vessel disease or post treatment effect. No areas of vasogenic edema. Vascular: No hyperdense vessel or unexpected calcification. Skull: Normal. Negative  for fracture or focal lesion. Sinuses/Orbits: No acute finding. Other: None. IMPRESSION: No acute intracranial findings. No findings concerning for metastatic disease on this noncontrast exam. Hypoattenuation of white matter could represent post treatment effect or small vessel disease, but the appearance is stable from July 2019. Electronically Signed   By: Staci Righter M.D.   On: 02/10/2018 19:50   US Renal  Result Date: 02/11/2018 CLINICAL DATA:  52 year old female with renal failure. Stage IV uterine cancer. Subsequent encounter. EXAM: RENAL / URINARY TRACT ULTRASOUND COMPLETE COMPARISON:  02/10/2018 CT. FINDINGS: Right Kidney: Length: 10.1 cm. Cortical thinning. Marked hydroureteronephrosis. Stent in place. Left Kidney: Length: 11.6 cm.  Marked hydroureteronephrosis.  Stent in place. Bladder: Decompressed with Foley catheter in place. Stent in place. Diffuse wall thickening. IMPRESSION: 1. Bilateral ureteral stents are in place. Marked bilateral hydroureteronephrosis. Right renal cortical thinning. 2. Decompressed urinary bladder with Foley catheter and stents in place. Diffuse wall thickening. Please see recent CT report. Electronically Signed   By: Genia Del M.D.   On: 02/11/2018 11:06   Dg Chest Port 1 View  Result Date: 02/10/2018 CLINICAL DATA:  Shortness of breath EXAM: PORTABLE CHEST 1 VIEW COMPARISON:  CT 12/27/2017, radiograph 05/01/2005 FINDINGS: Right-sided central venous port tip over the right atrium. No focal airspace disease. Cardiomediastinal silhouette within normal limits. No pneumothorax. IMPRESSION: No active disease. Electronically Signed   By: Donavan Foil M.D.   On: 02/10/2018 18:06     Scheduled Meds: . sodium chloride   Intravenous Once  . ALPRAZolam  1 mg Oral Q6H  . feeding supplement (ENSURE ENLIVE)  237 mL Oral BID BM  . gabapentin  600 mg Oral Q12H  . HYDROmorphone   Intravenous Q4H  . lubiprostone  24 mcg Oral BID  . methadone  5 mg Oral BID  .  pantoprazole  40 mg Oral Daily  . senna-docusate  1 tablet Oral BID   Continuous Infusions:   LOS: 2 days   Time Spent in minutes   30 minutes  Daissy Yerian D.O. on 02/12/2018 at 3:45 PM  Between 7am to 7pm - Please see pager noted on [email or web address]  After 7pm go to [email or web address]  And look for the night coverage person covering for me after hours  [facility] Office  [phone]

## 2018-02-13 LAB — BASIC METABOLIC PANEL
ANION GAP: 13 (ref 5–15)
BUN: 36 mg/dL — ABNORMAL HIGH (ref 6–20)
CALCIUM: 9 mg/dL (ref 8.9–10.3)
CO2: 23 mmol/L (ref 22–32)
CREATININE: 2.22 mg/dL — AB (ref 0.44–1.00)
Chloride: 100 mmol/L (ref 98–111)
GFR calc non Af Amer: 24 mL/min — ABNORMAL LOW (ref >60)
GFR, EST AFRICAN AMERICAN: 28 mL/min — AB (ref >60)
Glucose, Bld: 107 mg/dL — ABNORMAL HIGH (ref 70–99)
Potassium: 3.7 mmol/L (ref 3.5–5.1)
SODIUM: 136 mmol/L (ref 135–145)

## 2018-02-13 LAB — CBC
HEMATOCRIT: 34.7 % — AB (ref 36.0–46.0)
Hemoglobin: 11.5 g/dL — ABNORMAL LOW (ref 12.0–15.0)
MCH: 30 pg (ref 26.0–34.0)
MCHC: 33.1 g/dL (ref 30.0–36.0)
MCV: 90.6 fL (ref 78.0–100.0)
Platelets: 197 10*3/uL (ref 150–400)
RBC: 3.83 MIL/uL — ABNORMAL LOW (ref 3.87–5.11)
RDW: 17.5 % — AB (ref 11.5–15.5)
WBC: 7.6 10*3/uL (ref 4.0–10.5)

## 2018-02-13 MED ORDER — LORAZEPAM 1 MG PO TABS
1.0000 mg | ORAL_TABLET | Freq: Once | ORAL | Status: AC
  Administered 2018-02-13: 1 mg via ORAL
  Filled 2018-02-13: qty 1

## 2018-02-13 NOTE — Progress Notes (Signed)
I paged Dr Ree Kida for Kerry Morris . Heart rate 133-135 when she is out of bed and agitated. Once she lies back down her HR drops to  110. Orders placed

## 2018-02-13 NOTE — Progress Notes (Signed)
PROGRESS NOTE    Kerry Morris  OVF:[id number] DOB: 08-28-1965 DOA: 02/10/2018 PCP: System, Pcp Not In   Brief Narrative:  HPI On 02/10/2018 by Dr. Gala Romney Kerry Morris is a 52 y.o. 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  Patient was apparently discharged today from there and was told there is no more chemo to be given.  Palliative care was suggested.  They came back home today and was brought to the ER due to significant swelling all over.  Patient also has hemoglobin 5.7.  Patient and family are talking about helping patient to get better in terms of comfort.  They appear to be more confused as to what they actually want.  No more chemo and the understand palliation will be the next option.  She is however having significant edema and renal failure with the anemia.  Patient is being admitted with the goals of mainly palliative treatment.  Patient and her husband are asking for nephrostomy tube.  Records have been obtained from [facility] to have a better idea what the prognosis is and care plan was..  Interim history Admitted for AKI and hyperkalemia as well as pain control. Palliative care consulted. Pending residential hospice.  Assessment & Plan   Acute renal failure with hyperkalemia -Suspect secondary to obstructive uropathy from cancer -CT abdomen pelvis showed bilateral hydronephrosis and hydroureter, due to stent malfunction or reflux along with progressively worsening tumor -Foley catheter placed -Urology consulted and appreciated recommended renal ultrasound to see if hydronephrosis is improved after Foley catheter placement -Urology did not feel that nephrostomy tubes were an option for this patient given her advanced cancer metastasis -Creatinine on admission 7.87, currently  2.22  Acute on chronic anemia -Likely secondary to renal failure and cancer -Hemoglobin is 5.8 on admission -Transfused PRBC, hemoglobin currently 11.5  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 -Family was recently told by [facility] [place] that palliative approach is best for this patient and that further chemotherapy was not recommended -Palliative care consulted and appreciated -Patient's overall prognosis is very poor -Continue PCA pump for pain control -Patient would benefit from residential hospice  Hyponatremia -Suspect secondary to renal failure  Malnourished -Likely secondary to cancer -nutrition was consulted, however no interventions warranted at this time given prognosis <4wks per palliative care   DVT Prophylaxis  SCDs  Code Status: DNR  Family Communication: Mother at bedside  Disposition Plan: Admitted, pending pain control and residential hospice [facility] place)  Consultants Palliative care Urology  Procedures  None  Antibiotics   Anti-infectives (From admission, onward)   None      Subjective:   Kerry Morris seen and examined today.  Complains of pain everywhere and does not feel the pump is controlling her pain. Would like the pain patch back. Continues to moan and cry.   Objective:   Vitals:   02/13/18 0524 02/13/18 0619 02/13/18 0944 02/13/18 1311  BP:  124/83 124/81   Pulse:  (!) 115 (!) 108   Resp: 15 10 (!) 8 11  Temp:  99.1 F (37.3 C) 98.4 F (36.9 C)   TempSrc:  Oral Oral   SpO2: 94% 96% 98% 95%  Weight:      Height:        Intake/Output Summary (Last 24 hours) at 02/13/2018 1358 Last data filed  at 02/12/2018 2124 Gross per 24 hour  Intake 400 ml  Output 700 ml  Net -300 ml   [employer] Weights   02/11/18 0231  Weight: 55.6 kg   Exam  General: Well developed, chronically ill appearing, mild distress  HEENT: NCAT,mucous membranes moist.   Cardiovascular: S1 S2 auscultated, RRR, no  murmur  Respiratory: Clear to auscultation bilaterally  Abdomen: Soft, nontender, nondistended, + bowel sounds  Extremities: warm dry without cyanosis clubbing or edema  Neuro: awake and alert, nonfocal, not able to answer many questions, complains of pain and moaning  Psych: Anxious, crying   Data Reviewed: I have personally reviewed following labs and imaging studies  CBC: Recent Labs  Lab 02/10/18 1840 02/11/18 2239 02/12/18 0533 02/13/18 0526  WBC 7.0 4.5 8.4 7.6  NEUTROABS 5.2  --  6.4  --   HGB 5.8* 9.7* 12.0 11.5*  HCT 18.1* 28.6* 34.9* 34.7*  MCV 95.3 89.4 88.6 90.6  PLT 294 253 262 709   Basic Metabolic Panel: Recent Labs  Lab 02/10/18 1840 02/11/18 0940 02/12/18 0533 02/13/18 0526  NA 129* 132* 133* 136  K 5.9* 4.7 4.0 3.7  CL 94* 97* 96* 100  CO2 18* 20* 23 23  GLUCOSE 97 82 124* 107*  BUN 73* 63* 39* 36*  CREATININE 7.87* 4.96* 2.41* 2.22*  CALCIUM 9.0 9.2 9.4 9.0   GFR: Estimated Creatinine Clearance: 26 mL/min (A) (by C-G formula based on SCr of 2.22 mg/dL (H)). Liver Function Tests: Recent Labs  Lab 02/10/18 1840 02/11/18 0940  AST 17 15  ALT 12 12  ALKPHOS 173* 164*  BILITOT 1.2 1.0  PROT 6.8 7.3  ALBUMIN 2.7* 2.9*   No results for input(s): LIPASE, AMYLASE in the last 168 hours. Recent Labs  Lab 02/10/18 1840  AMMONIA 23   Coagulation Profile: No results for input(s): INR, PROTIME in the last 168 hours. Cardiac Enzymes: No results for input(s): CKTOTAL, CKMB, CKMBINDEX, TROPONINI in the last 168 hours. BNP (last 3 results) No results for input(s): PROBNP in the last 8760 hours. HbA1C: No results for input(s): HGBA1C in the last 72 hours. CBG: No results for input(s): GLUCAP in the last 168 hours. Lipid Profile: No results for input(s): CHOL, HDL, LDLCALC, TRIG, CHOLHDL, LDLDIRECT in the last 72 hours. Thyroid Function Tests: No results for input(s): TSH, T4TOTAL, FREET4, T3FREE, THYROIDAB in the last 72 hours. Anemia  Panel: No results for input(s): VITAMINB12, FOLATE, FERRITIN, TIBC, IRON, RETICCTPCT in the last 72 hours. Urine analysis:    Component Value Date/Time   COLORURINE YELLOW 02/10/2018 1923   APPEARANCEUR HAZY (A) 02/10/2018 1923   LABSPEC 1.012 02/10/2018 1923   LABSPEC 1.005 03/12/2017 1011   PHURINE 5.0 02/10/2018 1923   GLUCOSEU NEGATIVE 02/10/2018 1923   GLUCOSEU Negative 03/12/2017 1011   HGBUR SMALL (A) 02/10/2018 1923   BILIRUBINUR NEGATIVE 02/10/2018 1923   BILIRUBINUR Negative 03/12/2017 1011   KETONESUR NEGATIVE 02/10/2018 1923   PROTEINUR 100 (A) 02/10/2018 1923   UROBILINOGEN 0.2 03/12/2017 1011   NITRITE NEGATIVE 02/10/2018 1923   LEUKOCYTESUR MODERATE (A) 02/10/2018 1923   LEUKOCYTESUR Trace 03/12/2017 1011   Sepsis Labs: @LABRCNTIP (procalcitonin:4,lacticidven:4)  ) Recent Results (from the past 240 hour(s))  Blood culture (routine x 2)     Status: None (Preliminary result)   Collection Time: 02/10/18  8:50 PM  Result Value Ref Range Status   Specimen Description BLOOD RIGHT ANTECUBITAL  Final   Special Requests   Final    BOTTLES DRAWN AEROBIC  AND ANAEROBIC Blood Culture adequate volume   Culture   Final    NO GROWTH 3 DAYS Performed at [facility], [place] [address], [place], [place] [address]    Report Status PENDING  Incomplete  Blood culture (routine x 2)     Status: None (Preliminary result)   Collection Time: 02/10/18  8:52 PM  Result Value Ref Range Status   Specimen Description BLOOD LEFT HAND  Final   Special Requests   Final    BOTTLES DRAWN AEROBIC AND ANAEROBIC Blood Culture results may not be optimal due to an inadequate volume of blood received in culture bottles   Culture   Final    NO GROWTH 3 DAYS Performed at [facility], [place] [address] [place], [place] [address]    Report Status PENDING  Incomplete      Radiology Studies: No results found.   Scheduled Meds: . sodium chloride   Intravenous Once  . ALPRAZolam  1 mg  Oral Q6H  . feeding supplement (ENSURE ENLIVE)  237 mL Oral BID BM  . gabapentin  600 mg Oral Q12H  . HYDROmorphone   Intravenous Q4H  . lubiprostone  24 mcg Oral BID  . senna-docusate  1 tablet Oral BID   Continuous Infusions:   LOS: 3 days   Time Spent in minutes   30 minutes  Abednego Yeates D.O. on 02/13/2018 at 1:58 PM  Between 7am to 7pm - Please see pager noted on [email or web address]  After 7pm go to [email or web address]  And look for the night coverage person covering for me after hours  [facility] Office  [phone]

## 2018-02-13 NOTE — Progress Notes (Signed)
Patient ID: Kerry Morris, female   DOB: 18-Aug-1965, 52 y.o.   MRN: [id number]  This NP visited patient at the bedside as a follow up for palliative medicine needs and continued conversation regarding goals of care   Patient is resting comfortably!    Patient finally has been able to rest with her pain now under control with Dilaudid pump.    Her mother tells me she was able to sleep for 4 hours last night    Yesterday patient was able to verbalize her understanding of her limited prognosis and her wish to make comfort the focus of care.( did not wake [name] at this time)  Meet with husband/Hugo,  both he and patient's  Mother/Eddie agree that a shift to a full comfort path is the best thing for [name].  They understand that anything could happen at any time and prognosis is likely days to weeks.  Family is hopeful for transition to residential hospice.  Family had previously spoke to hospice of [facility] anticipating need for in-home hospice services, now they are hoping for [facility] for end-of-life care.  Will write for choice:  Continue to streamline pain medication regime   Continue Dilaudid pump and patient will need this for transport - Dilaudid basel rate to 2 mg/hr - Dilaudid prn dose to 1.5 every 10 minutes -lock out dose of 7 mg -discontinue Methadone  - continue Xanax 1 mg every 6 hrs scheduled  - continue Neuronin to 600 mg bid as tolerated    Emotional support offered to family at bedside . Husband spoke freely to his struggle with the  anticipation of his wife's death.    Questions and concerns addressed    Total time spent on the unit was 35 minutes  Greater than 50% of the time was spent in counseling and coordination of care  Wadie Lessen NP  Palliative Medicine Team Team Phone # [phone] Pager [phone]

## 2018-02-13 NOTE — Clinical Social Work Note (Signed)
Clinical Social Work Assessment  Patient Details  Name: Kerry Morris MRN: [id number] Date of Birth: [birth year]  Date of referral:  02/13/18               Reason for consult:  End of Life/Hospice                Permission sought to share information with:  Family Supports Permission granted to share information::  Yes, Verbal Permission Granted  Name::     husband Sports administrator::  [facility]  Relationship::     Contact Information:     Housing/Transportation Living arrangements for the past 2 months:  [place] of Information:  Medical Team, Spouse, Palliative Care Team Patient Interpreter Needed:  None Criminal Activity/Legal Involvement Pertinent to Current Situation/Hospitalization:  No - Comment as needed Significant Relationships:  Parents, Spouse Lives with:  Spouse Do you feel safe going back to the place where you live?  Yes Need for family participation in patient care:  Yes (Comment)(spouse and mother involved)  Care giving concerns:  Pt admitted from home- admitted with acute renal failure. Pt has stage IV endometrial Ca and reports being recently dishcarged from [place] being told there were no further treatment options for her. Pt at this time has elected for comfort focused path of treatment. Prior to admission had just begun looking into [facility] services for hospice at home.    Social Worker assessment / plan:  CSW consulted to assist with hospice referral.  Pt and family have been working with providers and PMT to determine most appropriate disposition plan given care needs/history above. Today pt and family report having decided to pursue full comfort care and desire referral to [facility]. CSW contacted [facility] with referral- was advised no current availability at [facility] but accepted referral for review. At this time per husband not interested in any other hospice facilities.  Plan: Referred to [facility] for hospice care at  [place]  Employment status:  Unemployed Insurance information:  Managed Care(BCBS) PT Recommendations:    Information / Referral to community resources:  Other (Comment Required)(hospice)  Patient/Family's Response to care:  Appreciative  Patient/Family's Understanding of and Emotional Response to Diagnosis, Current Treatment, and Prognosis:  Did not discuss at length as these discussions have been had with multiple providers. Emotionally pleasant and gracious however.  Emotional Assessment Appearance:  Appears stated age Attitude/Demeanor/Rapport:  Gracious Affect (typically observed):  Calm Orientation:  Oriented to Self, Oriented to Place, Oriented to  Time, Oriented to Situation Alcohol / Substance use:  Not Applicable Psych involvement (Current and /or in the community):  No (Comment)  Discharge Needs  Concerns to be addressed:  Discharge Planning Concerns, Adjustment to Illness Readmission within the last 30 days:  No Current discharge risk:  Chronically ill Barriers to Discharge:      Nila Nephew, LCSW 02/13/2018, 2:05 PM  [phone]

## 2018-02-13 NOTE — Progress Notes (Signed)
[facility] [place]) RN Visit  Received request from [place] for family interest in [facility].  Chart reviewed, spoke with pt, mom (Morley) and spouse Kirtland Bouchard) at the bedside to acknowledge referral.  Unfortunately [facility] does not have a room to offer today.  Family and CSW are aware that [facility] Liaison will follow up with CSW and family tomorrow or sooner if a bed becomes available.  Please do not hesitate to call with any hospice related questions/concerns  Thank you, Venia Carbon BSN, [facility] Liaison [phone]  [facility] Liaisons are listed in [facility]

## 2018-02-14 DIAGNOSIS — D649 Anemia, unspecified: Secondary | ICD-10-CM

## 2018-02-14 MED ORDER — LORAZEPAM 2 MG/ML IJ SOLN
1.0000 mg | INTRAMUSCULAR | Status: DC | PRN
  Administered 2018-02-14: 1 mg via INTRAVENOUS
  Filled 2018-02-14: qty 1

## 2018-02-14 MED ORDER — ALPRAZOLAM 0.5 MG PO TABS
0.5000 mg | ORAL_TABLET | Freq: Once | ORAL | Status: AC
  Administered 2018-02-14: 0.5 mg via ORAL
  Filled 2018-02-14: qty 1

## 2018-02-14 NOTE — Progress Notes (Signed)
PROGRESS NOTE    Kerry Morris  EUM:[id number] DOB: June 17, 1965 DOA: 02/10/2018 PCP: System, Pcp Not In   Brief Narrative:  HPI On 02/10/2018 by Dr. Gala Morris Kerry Morris is a 52 y.o. 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  Patient was apparently discharged today from there and was told there is no more chemo to be given.  Palliative care was suggested.  They came back home today and was brought to the ER due to significant swelling all over.  Patient also has hemoglobin 5.7.  Patient and family are talking about helping patient to get better in terms of comfort.  They appear to be more confused as to what they actually want.  No more chemo and the understand palliation will be the next option.  She is however having significant edema and renal failure with the anemia.  Patient is being admitted with the goals of mainly palliative treatment.  Patient and her husband are asking for nephrostomy tube.  Records have been obtained from [facility] to have a better idea what the prognosis is and care plan was..  Interim history Admitted for AKI and hyperkalemia as well as pain control. Palliative care consulted. Pending residential hospice.  Assessment & Plan   Acute renal failure with hyperkalemia -Suspect secondary to obstructive uropathy from cancer -CT abdomen pelvis showed bilateral hydronephrosis and hydroureter, due to stent malfunction or reflux along with progressively worsening tumor -Foley catheter placed -Urology consulted and appreciated recommended renal ultrasound to see if hydronephrosis is improved after Foley catheter placement -Urology did not feel that nephrostomy tubes were an option for this patient given her advanced cancer metastasis -Creatinine on admission 7.87, currently  2.22  Acute on chronic anemia -Likely secondary to renal failure and cancer -Hemoglobin is 5.8 on admission -Transfused PRBC, hemoglobin currently 11.5  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 -Family was recently told by [facility] [place] that palliative approach is best for this patient and that further chemotherapy was not recommended -Palliative care consulted and appreciated -Patient's overall prognosis is very poor -Continue PCA pump for pain control -Patient would benefit from residential hospice  Hyponatremia -Suspect secondary to renal failure  Malnourished -Likely secondary to cancer -nutrition was consulted, however no interventions warranted at this time given prognosis <4wks per palliative care   DVT Prophylaxis  SCDs  Code Status: DNR  Family Communication: Mother at bedside  Disposition Plan: Admitted, pending pain control and residential hospice [facility])  Consultants Palliative care Urology  Procedures  None  Antibiotics   Anti-infectives (From admission, onward)   None      Subjective:   [name] seen and examined today.  Continues to complain of pain despite using the dilaudid pump. Denies chest pain, SOB, abdominal pain.   Objective:   Vitals:   02/14/18 0008 02/14/18 0409 02/14/18 0411 02/14/18 1319  BP:  120/82    Pulse:  (!) 114    Resp: 11 18 (!) 21 15  Temp:  98.6 F (37 C)    TempSrc:  Oral    SpO2: 96% 97% 96% 98%  Weight:      Height:        Intake/Output Summary (Last 24 hours) at 02/14/2018 1332 Last data filed at 02/14/2018 1000 Gross per 24 hour  Intake 1 ml  Output  2200 ml  Net -2199 ml   Filed Weights   02/11/18 0231  Weight: 55.6 kg   Exam  General: Well developed, chronically ill appearing, NAD  HEENT: NCAT, mucous membranes moist.   Neck: Supple  Cardiovascular: S1 S2 auscultated, RRR, no murmur  Respiratory: Clear to auscultation bilaterally with equal  chest rise  Abdomen: Soft, nontender, nondistended, + bowel sounds  Extremities: warm dry without cyanosis clubbing or edema  Neuro: AAOx3, nonfocal   Psych: Angry, yelling at her mom  Data Reviewed: I have personally reviewed following labs and imaging studies  CBC: Recent Labs  Lab 02/10/18 1840 02/11/18 2239 02/12/18 0533 02/13/18 0526  WBC 7.0 4.5 8.4 7.6  NEUTROABS 5.2  --  6.4  --   HGB 5.8* 9.7* 12.0 11.5*  HCT 18.1* 28.6* 34.9* 34.7*  MCV 95.3 89.4 88.6 90.6  PLT 294 253 262 564   Basic Metabolic Panel: Recent Labs  Lab 02/10/18 1840 02/11/18 0940 02/12/18 0533 02/13/18 0526  NA 129* 132* 133* 136  K 5.9* 4.7 4.0 3.7  CL 94* 97* 96* 100  CO2 18* 20* 23 23  GLUCOSE 97 82 124* 107*  BUN 73* 63* 39* 36*  CREATININE 7.87* 4.96* 2.41* 2.22*  CALCIUM 9.0 9.2 9.4 9.0   GFR: Estimated Creatinine Clearance: 26 mL/min (A) (by C-G formula based on SCr of 2.22 mg/dL (H)). Liver Function Tests: Recent Labs  Lab 02/10/18 1840 02/11/18 0940  AST 17 15  ALT 12 12  ALKPHOS 173* 164*  BILITOT 1.2 1.0  PROT 6.8 7.3  ALBUMIN 2.7* 2.9*   No results for input(s): LIPASE, AMYLASE in the last 168 hours. Recent Labs  Lab 02/10/18 1840  AMMONIA 23   Coagulation Profile: No results for input(s): INR, PROTIME in the last 168 hours. Cardiac Enzymes: No results for input(s): CKTOTAL, CKMB, CKMBINDEX, TROPONINI in the last 168 hours. BNP (last 3 results) No results for input(s): PROBNP in the last 8760 hours. HbA1C: No results for input(s): HGBA1C in the last 72 hours. CBG: No results for input(s): GLUCAP in the last 168 hours. Lipid Profile: No results for input(s): CHOL, HDL, LDLCALC, TRIG, CHOLHDL, LDLDIRECT in the last 72 hours. Thyroid Function Tests: No results for input(s): TSH, T4TOTAL, FREET4, T3FREE, THYROIDAB in the last 72 hours. Anemia Panel: No results for input(s): VITAMINB12, FOLATE, FERRITIN, TIBC, IRON, RETICCTPCT in the last 72 hours. Urine  analysis:    Component Value Date/Time   COLORURINE YELLOW 02/10/2018 1923   APPEARANCEUR HAZY (A) 02/10/2018 1923   LABSPEC 1.012 02/10/2018 1923   LABSPEC 1.005 03/12/2017 1011   PHURINE 5.0 02/10/2018 1923   GLUCOSEU NEGATIVE 02/10/2018 1923   GLUCOSEU Negative 03/12/2017 1011   HGBUR SMALL (A) 02/10/2018 1923   BILIRUBINUR NEGATIVE 02/10/2018 1923   BILIRUBINUR Negative 03/12/2017 1011   KETONESUR NEGATIVE 02/10/2018 1923   PROTEINUR 100 (A) 02/10/2018 1923   UROBILINOGEN 0.2 03/12/2017 1011   NITRITE NEGATIVE 02/10/2018 1923   LEUKOCYTESUR MODERATE (A) 02/10/2018 1923   LEUKOCYTESUR Trace 03/12/2017 1011   Sepsis Labs: @LABRCNTIP (procalcitonin:4,lacticidven:4)  ) Recent Results (from the past 240 hour(s))  Blood culture (routine x 2)     Status: None (Preliminary result)   Collection Time: 02/10/18  8:50 PM  Result Value Ref Range Status   Specimen Description BLOOD RIGHT ANTECUBITAL  Final   Special Requests   Final    BOTTLES DRAWN AEROBIC AND ANAEROBIC Blood Culture adequate volume   Culture   Final    NO  GROWTH 4 DAYS Performed at [facility], [place] [address] [place], [place] [address]    Report Status PENDING  Incomplete  Blood culture (routine x 2)     Status: None (Preliminary result)   Collection Time: 02/10/18  8:52 PM  Result Value Ref Range Status   Specimen Description BLOOD LEFT HAND  Final   Special Requests   Final    BOTTLES DRAWN AEROBIC AND ANAEROBIC Blood Culture results may not be optimal due to an inadequate volume of blood received in culture bottles   Culture   Final    NO GROWTH 4 DAYS Performed at [facility], [place] [address], [place], [place] [address]    Report Status PENDING  Incomplete      Radiology Studies: No results found.   Scheduled Meds: . sodium chloride   Intravenous Once  . ALPRAZolam  1 mg Oral Q6H  . feeding supplement (ENSURE ENLIVE)  237 mL Oral BID BM  . gabapentin  600 mg Oral Q12H  .  HYDROmorphone   Intravenous Q4H  . lubiprostone  24 mcg Oral BID  . senna-docusate  1 tablet Oral BID   Continuous Infusions:   LOS: 4 days   Time Spent in minutes   15 minutes  Tee Richeson D.O. on 02/14/2018 at 1:32 PM  Between 7am to 7pm - Please see pager noted on [email or web address]  After 7pm go to [email or web address]  And look for the night coverage person covering for me after hours  [facility] Office  [phone]

## 2018-02-14 NOTE — Progress Notes (Signed)
Patient ordered to have alprazolam 1mg , dosage in pyxis is 0.5mg  each, 2 tablets pulled and scanned. During administration, patient dropped 1 tablet on floor. Wasted with Kim,RN. Pharmacy notified and to place a one time dose for alprazolam 0.5mg  to give her a 1mg  total.

## 2018-02-14 NOTE — Progress Notes (Signed)
Patient ID: Kerry Morris, female   DOB: 10-15-1965, 52 y.o.   MRN: [id number]  PMT follow up for palliative medicine needs and continued conversation regarding goals of care   Patient is resting comfortably, mother is at bedside, who states that the patient ate a reasonably good breakfast, her pain is well controlled.   Patient opens her eyes, she has been moaning as well.   BP 120/82 (BP Location: Left Arm)   Pulse (!) 114   Temp 98.6 F (37 C) (Oral)   Resp 15   Ht 5\' 5"  (1.651 m)   Wt 55.6 kg   SpO2 98%   BMI 20.40 kg/m  Chart reviewed, labs noted.   Pale weak appearing Resting in bed Opens eyes, is able to mumble a few words appropriately Shallow clear breath sounds S 1 S 2  Abdomen is not distended Extremities with muscle wasting, warm to touch.   Mother remains hopeful for transition to residential hospice, she appreciates [facility] follow up.   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  Questions and concerns addressed    Total time spent on the unit was 25 minutes  Greater than 50% of the time was spent in counseling and coordination of care  Loistine Chance MD [facility] health palliative medicine team [phone]

## 2018-02-15 LAB — BASIC METABOLIC PANEL
ANION GAP: 13 (ref 5–15)
BUN: 34 mg/dL — ABNORMAL HIGH (ref 6–20)
CALCIUM: 8.7 mg/dL — AB (ref 8.9–10.3)
CO2: 23 mmol/L (ref 22–32)
Chloride: 102 mmol/L (ref 98–111)
Creatinine, Ser: 2.75 mg/dL — ABNORMAL HIGH (ref 0.44–1.00)
GFR, EST AFRICAN AMERICAN: 22 mL/min — AB (ref >60)
GFR, EST NON AFRICAN AMERICAN: 19 mL/min — AB (ref >60)
GLUCOSE: 105 mg/dL — AB (ref 70–99)
Potassium: 3.6 mmol/L (ref 3.5–5.1)
Sodium: 138 mmol/L (ref 135–145)

## 2018-02-15 LAB — CULTURE, BLOOD (ROUTINE X 2)
CULTURE: NO GROWTH
Culture: NO GROWTH
SPECIAL REQUESTS: ADEQUATE

## 2018-02-15 NOTE — Progress Notes (Signed)
Palliative Care of [facility]): Hospital Liaison Note   Received request from [place] for family interest in [facility] with request for transfer today. Chart Reviewed and received report from bedside RN. Met with pt and family (Mother -Ludwig Clarks) at bedside and confirm interest and explained services. Family agreeable to transfer today. CSW aware. Registration paper work completed at 11:00 AM today. Dr. Orpah Melter to assume care per family request. Please fax discharge summary to [phone]. RN please call report to [phone].  CSW please arrange transport for patient to arrive as soon as possible.  Thank You,  Raina Mina, RN, [facility] Liaison [phone]  [place] are on AMION

## 2018-02-15 NOTE — Discharge Summary (Signed)
Physician Discharge Summary  Patient ID: [name] MRN: [id number] DOB/AGE: February 02, 1966 52 y.o.  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 Patient was apparently discharged from there and was told there was no more chemo to be given. Palliative care was suggested. Patient came back home and was brought to the ER same day due to significant swelling all over. Patient also had hemoglobin 5.7.  Palliative care team was consulted to assist patient family and the finding goal of care.  Hospice care has been decided.  Will be discharged to the hospice house ([facility]).  Patient was managed for acute kidney injury, hyperkalemia, acute on chronic anemia, hyponatremia and malnutrition during the hospital stay.  Patient has been optimized and will be discharged to the hospice house.  Consultants Palliative care Urology  Procedures  None  Code status: DNR  Discharge Exam: Blood pressure (!) 156/113, pulse (!) 115, temperature (!) 97.5 F (36.4 C), temperature source Oral, resp. rate (!) 9, height 5\' 5"  (1.651 m), weight 55.6 kg, SpO2 95 %.   Disposition: Discharge disposition: 50-Hospice/Home   Discharge Instructions    Activity as tolerated - No restrictions   Complete by:  As directed    Diet general   Complete by:  As directed    Increase activity slowly   Complete by:  As directed      Time spent: 31  minutes  Signed: Bonnell Public 02/15/2018, 12:02 PM

## 2018-02-15 NOTE — Progress Notes (Signed)
Called and spoke with Joya San. at [facility] and gave report.

## 2018-02-15 NOTE — Progress Notes (Signed)
Patient discharging to [facility]. CSW confirmed bed at facility and faxed appropriate documents.  [facility] has been called for transport.  RN call report to: [phone]  No more CSW needs. Signing off.    Pricilla Holm, MSW, [place] Social Work [phone]

## 2018-02-25 DEATH — deceased

## 2018-10-23 IMAGING — DX DG CHEST 1V PORT
1 series · 1 of 1 positions shown · non-contrast
Comparison: CT 12/27/2017, radiograph 05/01/2005

CLINICAL DATA: Shortness of breath

EXAM:
PORTABLE CHEST 1 VIEW

[chest]
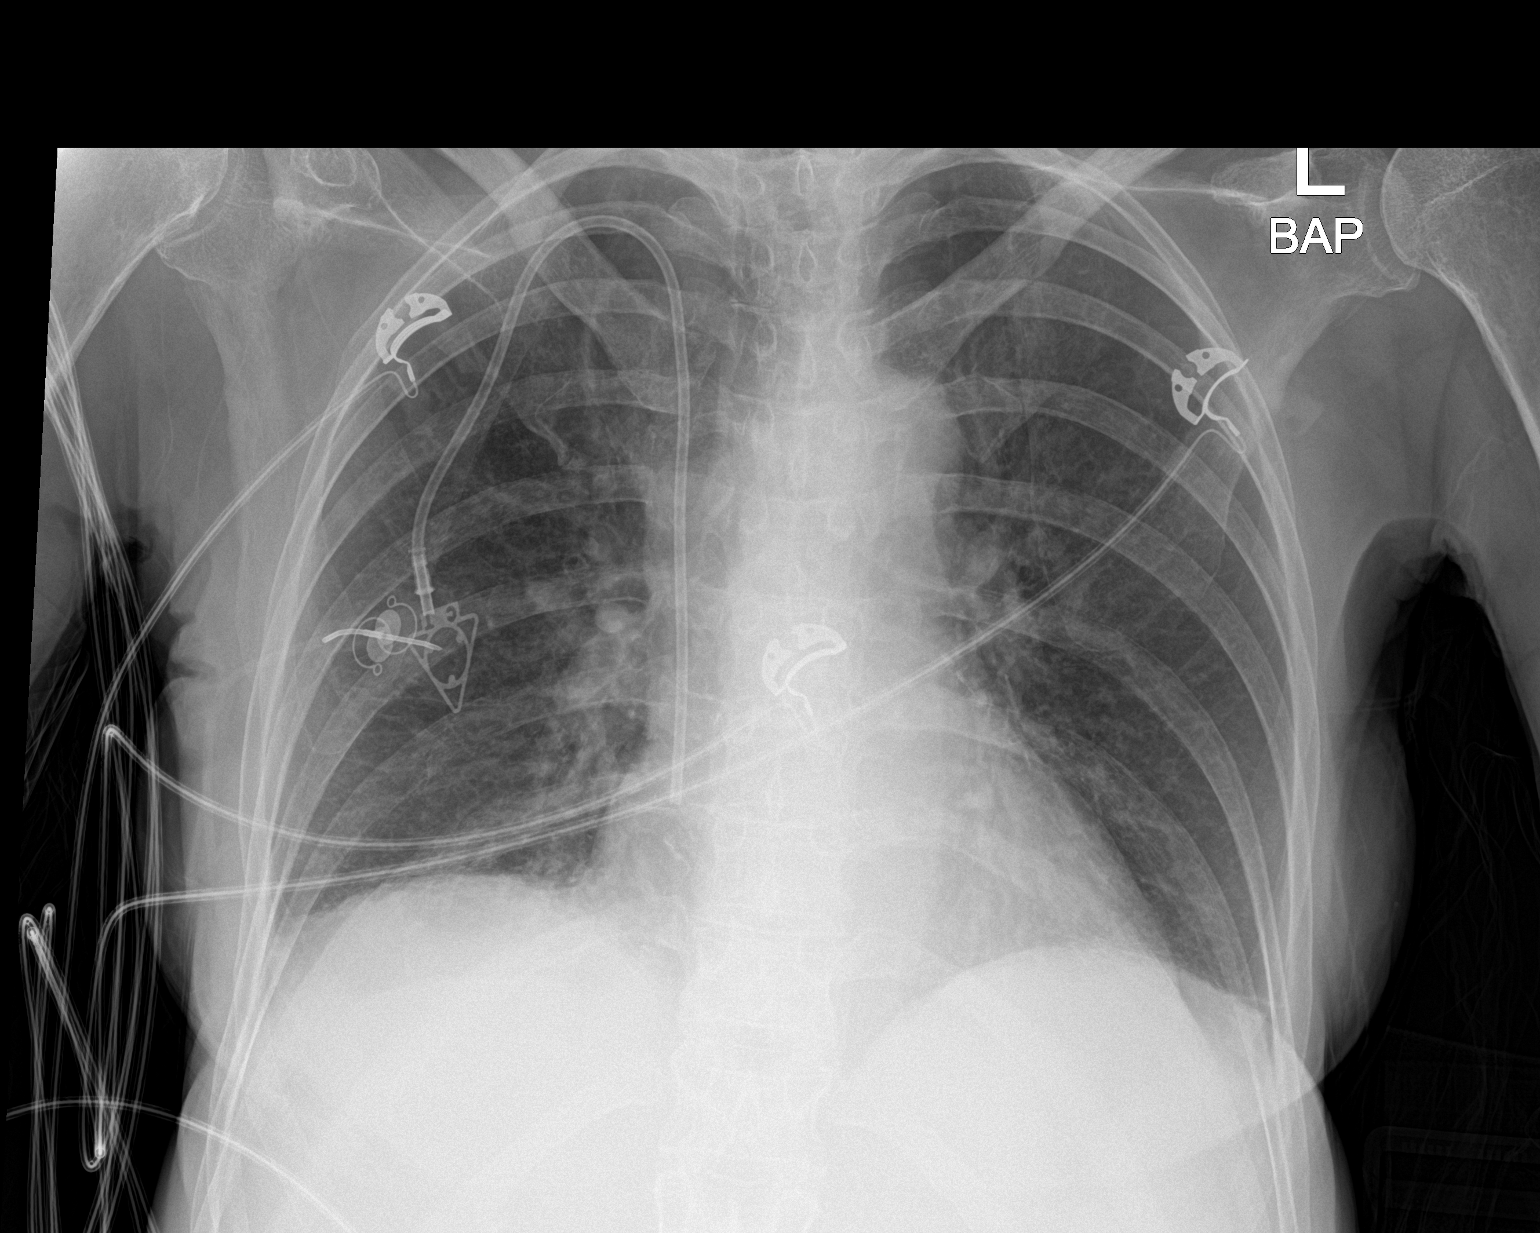

[1 of 1 positions shown; findings below may reference images not displayed]

FINDINGS: Right-sided central venous port tip over the right atrium. No focal
airspace disease. Cardiomediastinal silhouette within normal limits.
No pneumothorax.
IMPRESSION: No active disease.

## 2018-10-23 IMAGING — CT CT ABD-PELV W/O CM
2 of 4 series · 14 of 46 positions shown, 16 images · non-contrast
Comparison: 12/27/2017

CLINICAL DATA: Stage IV uterine cancer. Increasing pain over the
past 24 hours. Unable to pass urine. Abdominal distention.

EXAM:
CT ABDOMEN AND PELVIS WITHOUT CONTRAST
TECHNIQUE: Multidetector CT imaging of the abdomen and pelvis was performed
following the standard protocol without IV contrast.

[Series 3: ap without · axial · non-contrast · 0.74mm/px · z∈[-758,-343]mm · 11 of 93 slices shown, 13 images]
[im 5/93  soft-tissue]
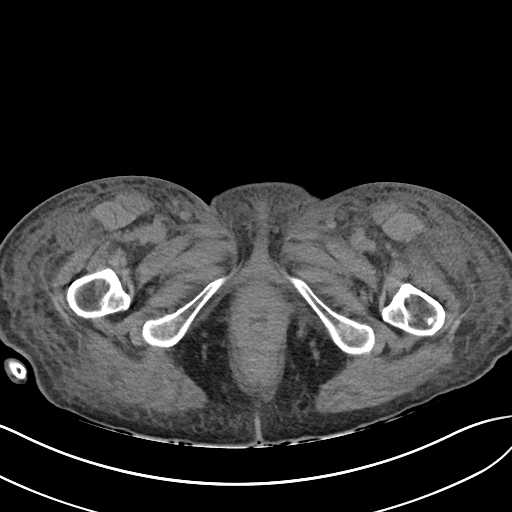
[im 5/93  bone]
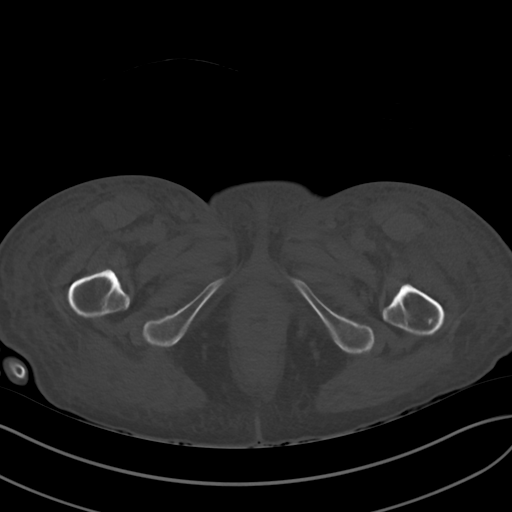
[im 14/93  soft-tissue]
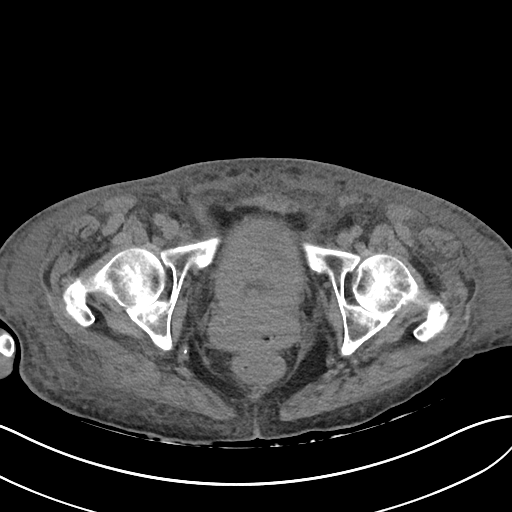
[im 22/93  soft-tissue]
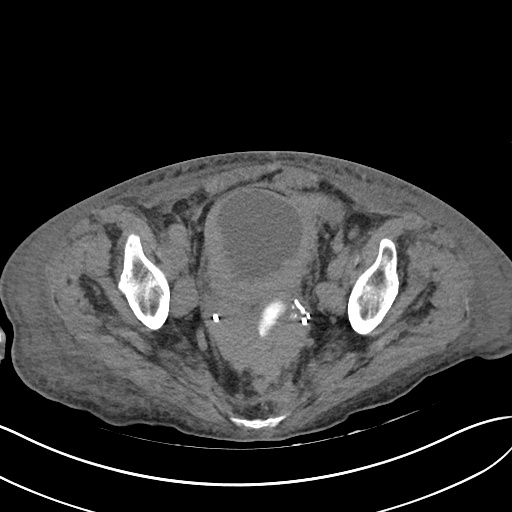
[im 31/93  soft-tissue]
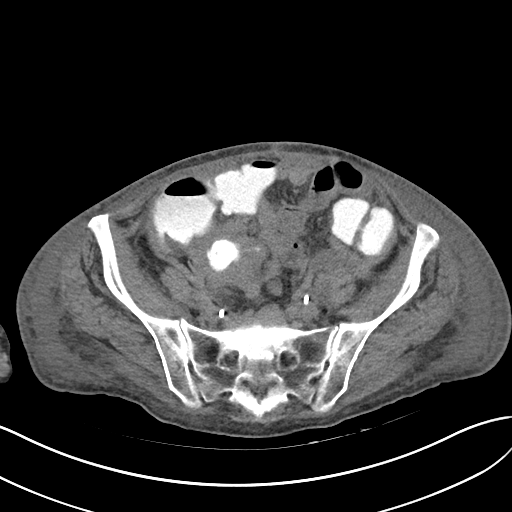
[im 40/93  soft-tissue]
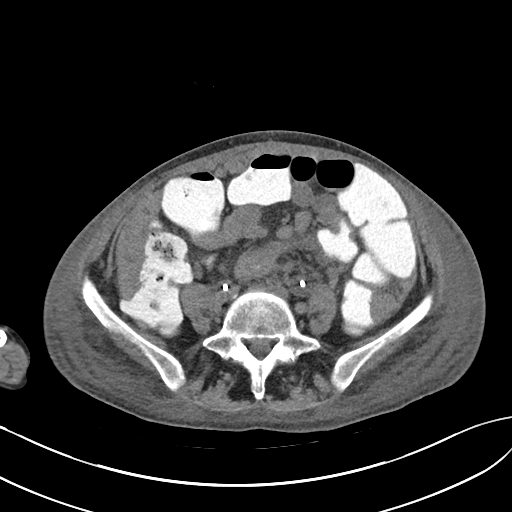
[im 49/93  soft-tissue]
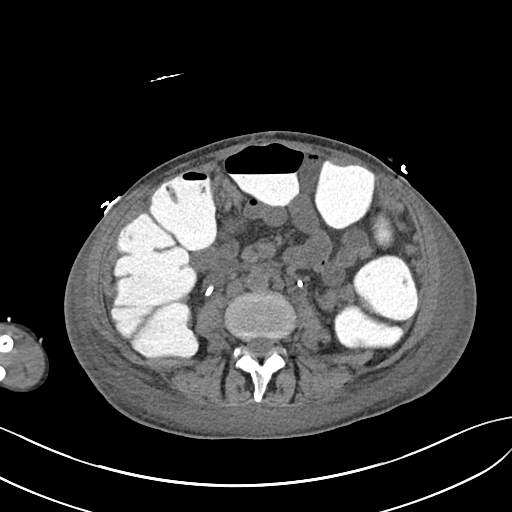
[im 53/93  soft-tissue]
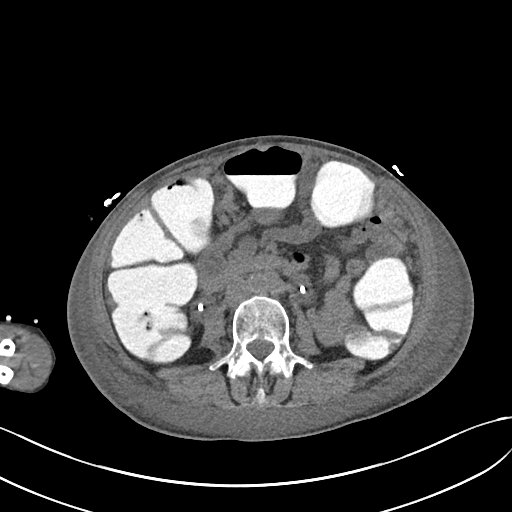
[im 62/93  soft-tissue]
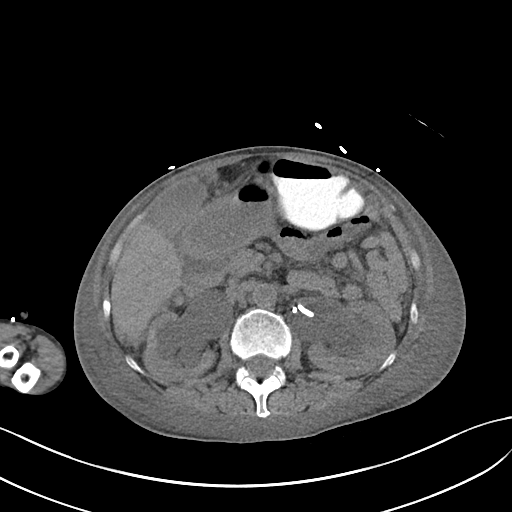
[im 71/93  soft-tissue]
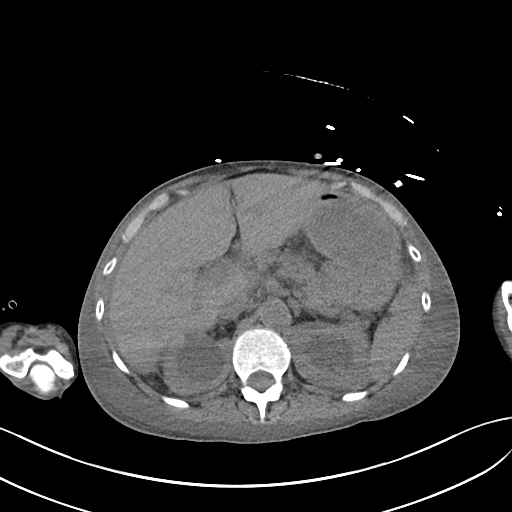
[im 71/93  bone]
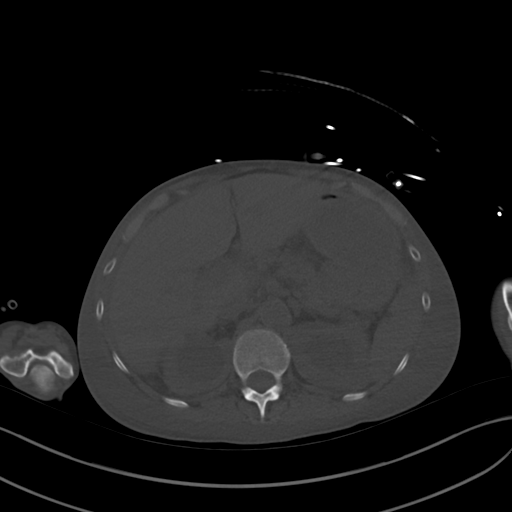
[im 79/93  soft-tissue]
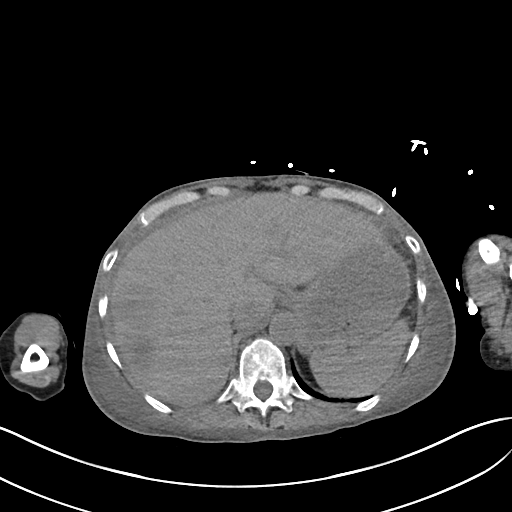
[im 88/93  soft-tissue]
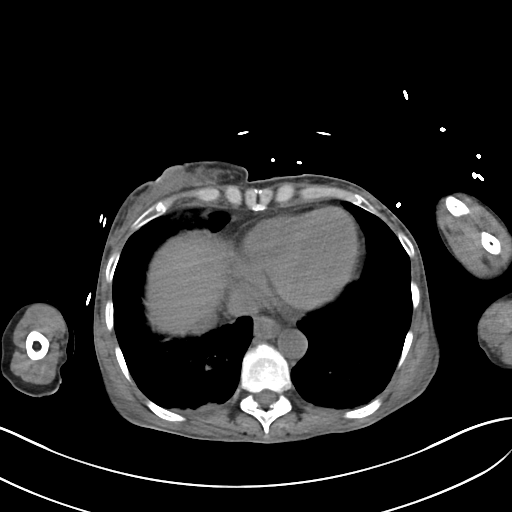

[Series 6: cor · coronal · 0.69mm/px · 3 of 93 slices shown]
[im 31/93  soft-tissue]
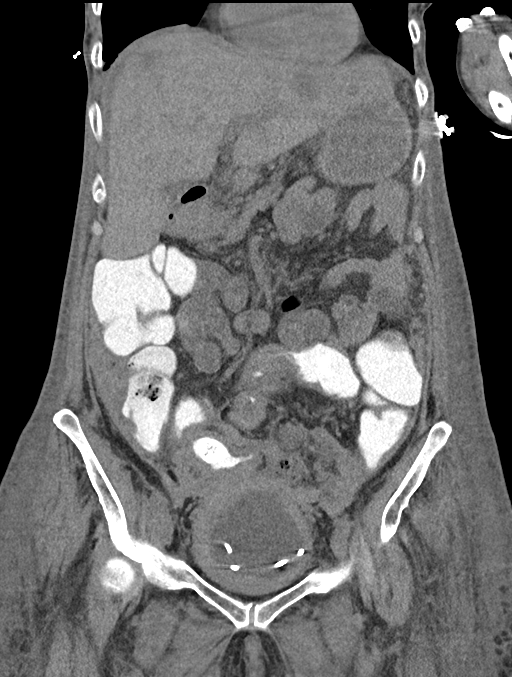
[im 41/93  soft-tissue]
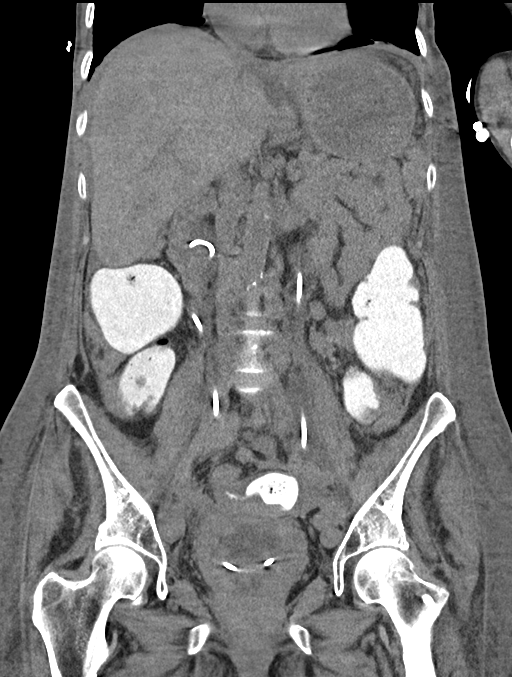
[im 52/93  soft-tissue]
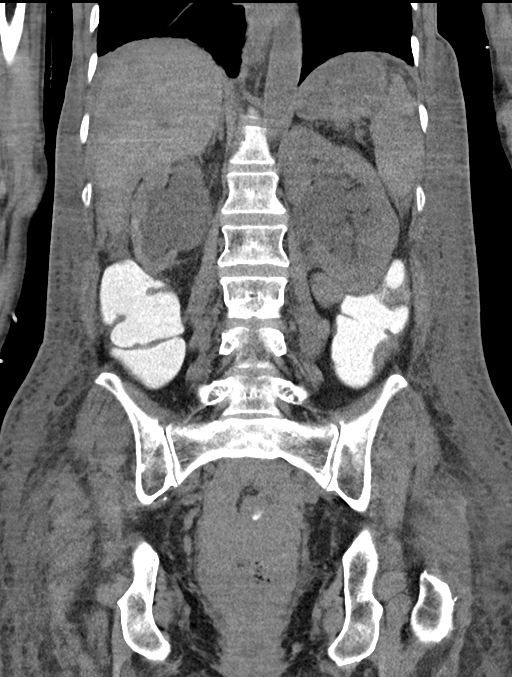

[14 of 46 positions shown; findings below may reference images not displayed]

FINDINGS: Lower chest: Dependent changes in the lung bases. Small esophageal
hiatal hernia.

Hepatobiliary: Noncontrast examination limits evaluation of solid
organs but there appears to be multiple low-attenuation lesions
throughout the liver consistent with metastatic disease. Difficult
to compare with previous contrast-enhanced study, but I suspect
progression since that previous study. Gallbladder and bile ducts
are unremarkable.

Pancreas: Unremarkable. No pancreatic ductal dilatation or
surrounding inflammatory changes.

Spleen: Normal in size without focal abnormality.

Adrenals/Urinary Tract: No adrenal gland nodules. Bilateral ureteral
stents are present. Proximal pigtails are positioned in the renal
pelvis and distal pigtails in the bladder. There is prominent
bilateral hydronephrosis and hydroureter. This is similar to
previous study and may indicate non functioning of the stents or may
be due to reflux. No stones are identified. The bladder wall is
thickened, asymmetrically more prominent posteriorly. This is
similar to previous study and may reflect tumor infiltration or
radiation change. Bladder is not abnormally distended.

Stomach/Bowel: Stomach, small bowel, and colon are mostly
decompressed. No abnormal distention. There is contrast material
throughout the colon. No wall thickening is appreciated.

Vascular/Lymphatic: Enlarged mesenteric and retroperitoneal lymph
nodes are present, better visualized on the previous
contrast-enhanced study. Likely metastatic. Calcification of the
aorta without aneurysm.

Reproductive: Surgical resection of uterus and ovaries. There is a
mass lesion in the low pelvis between the rectum and the bladder and
surrounding the rectosigmoid colon junction. There is likely direct
invasion of the rectosigmoid colon and of the posterior bladder.
This mass is better demonstrated on the previous contrast-enhanced
study. Pelvic lymphadenopathy bilaterally, likely metastatic.
Nodular infiltration in the mesentery likely metastatic.

Other: Diffuse edema throughout the subcutaneous fatty tissues,
progressing since previous study. No free air or free fluid
identified in the abdomen.

Musculoskeletal: No destructive bone lesions are appreciated.
IMPRESSION: 1. There is evidence of local tumor recurrence in the pelvis and
diffuse metastatic disease with a mass lesion between the rectum and
bladder, likely demonstrating direct invasion of the rectosigmoid
colon and posterior bladder wall. Metastatic lymphadenopathy in the
mesentery and retroperitoneum. Peritoneal tumor implants. Diffuse
liver metastases, likely progressing.
2. Bilateral ureteral stents. Persistent bilateral hydronephrosis
and hydroureter similar to prior study. This could be due to stent
malfunction or reflux.
3. Asymmetric bladder wall thickening without bladder distention.
This may indicate radiation change and/or tumor infiltration.
4. Diffuse edema throughout the subcutaneous fatty tissues is
developing since the previous study.
5. No evidence of bowel obstruction although the rectosigmoid colon
is narrowed by the tumor and there is residual contrast material
throughout the colon.

## 2018-10-24 IMAGING — US US RENAL
1 series · 14 of 25 positions shown · non-contrast
Comparison: 02/10/2018 CT.

CLINICAL DATA: 51-year-old female with renal failure. Stage IV
uterine cancer. Subsequent encounter.

EXAM:
RENAL / URINARY TRACT ULTRASOUND COMPLETE

[Series 1: [facility] renal · 14 of 41 slices shown]
[im 1/41]
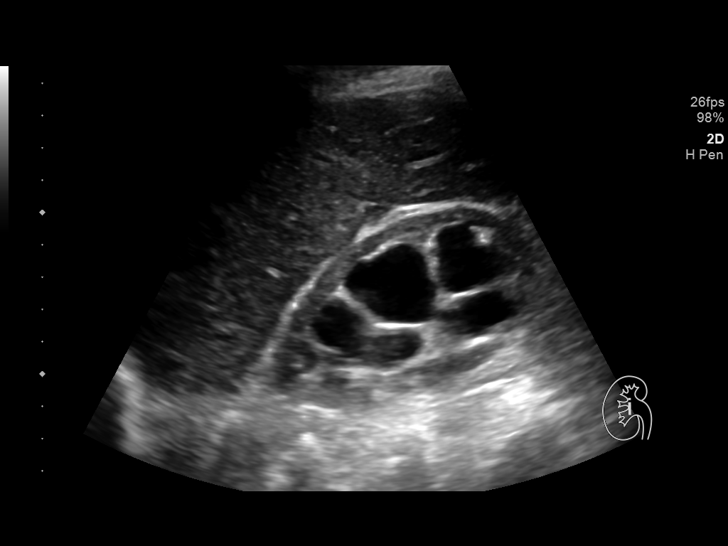
[im 4/41]
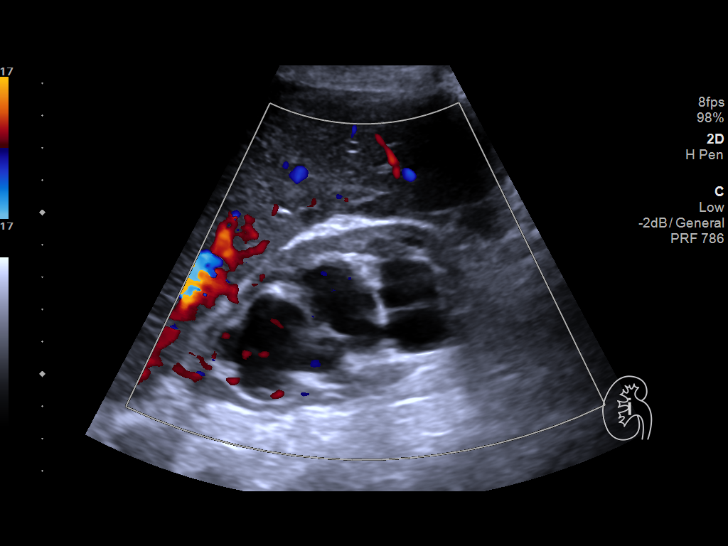
[im 7/41]
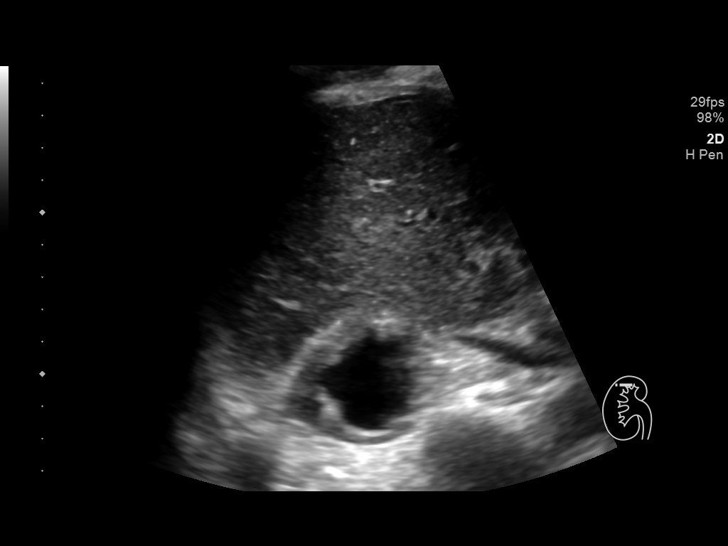
[im 11/41]
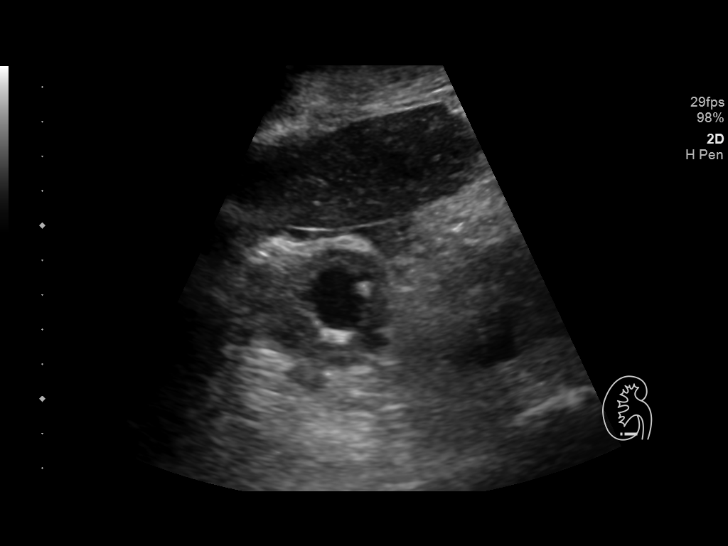
[im 14/41]
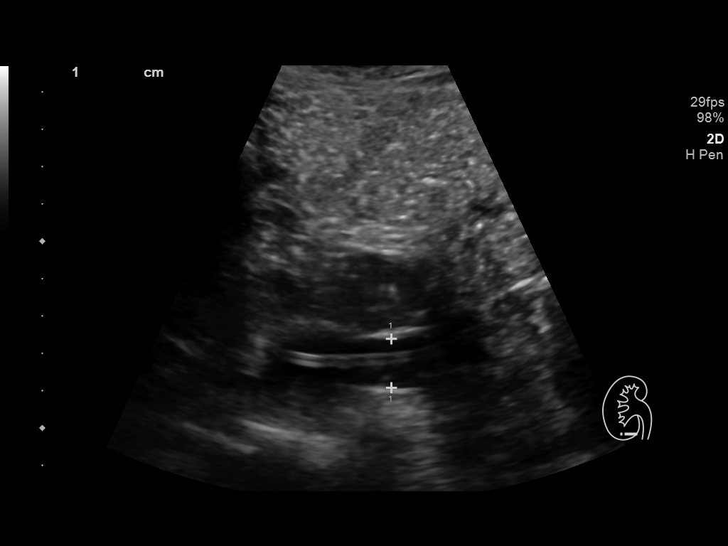
[im 16/41]
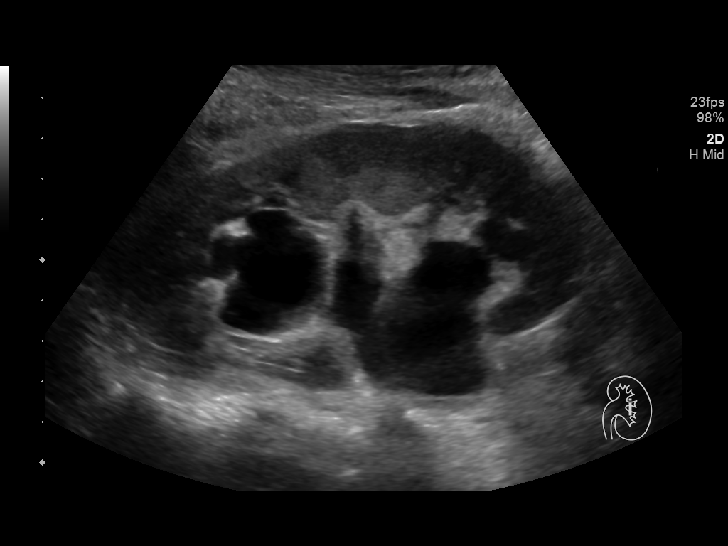
[im 19/41]
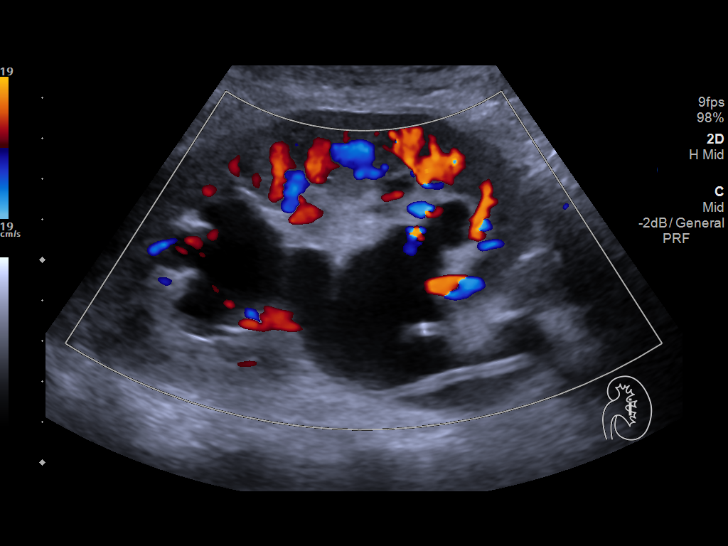
[im 22/41]
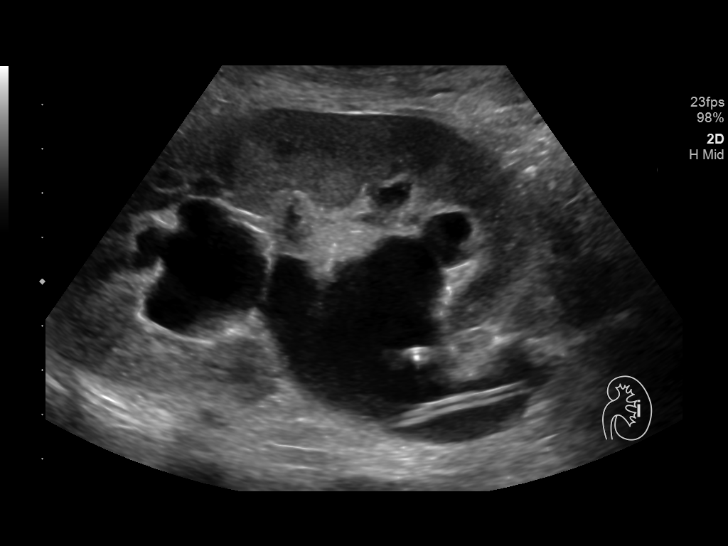
[im 26/41]
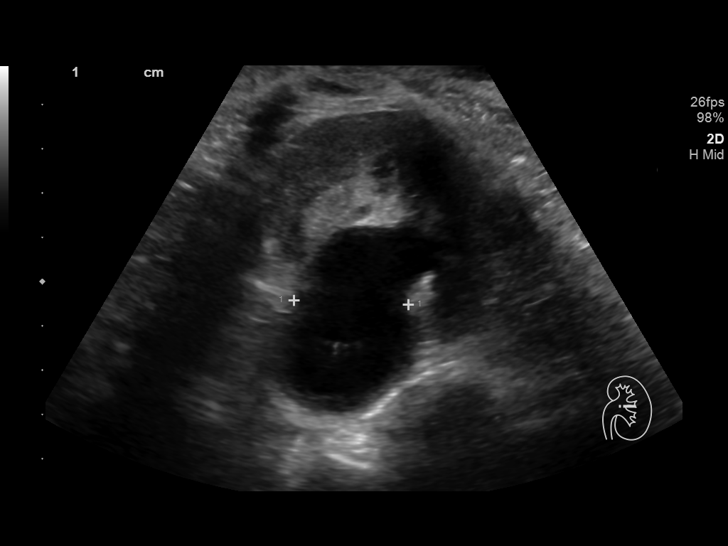
[im 27/41]
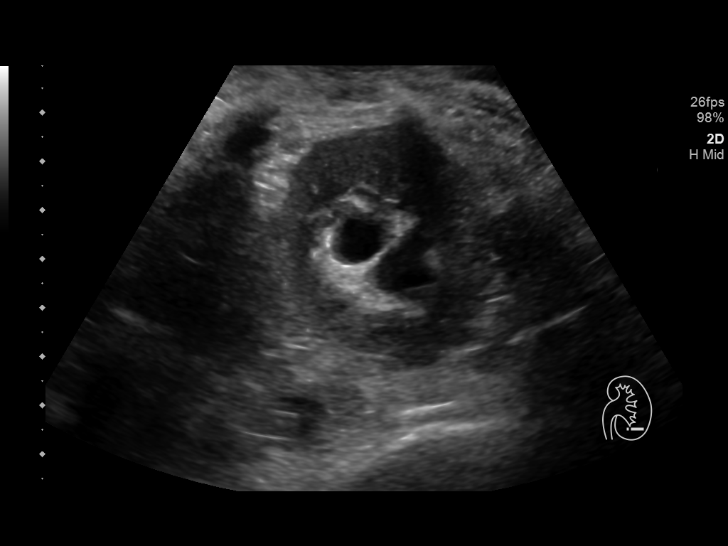
[im 31/41]
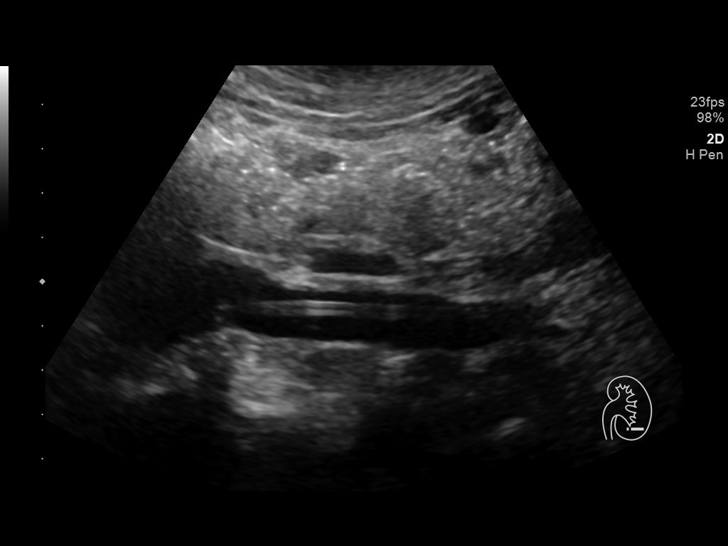
[im 34/41]
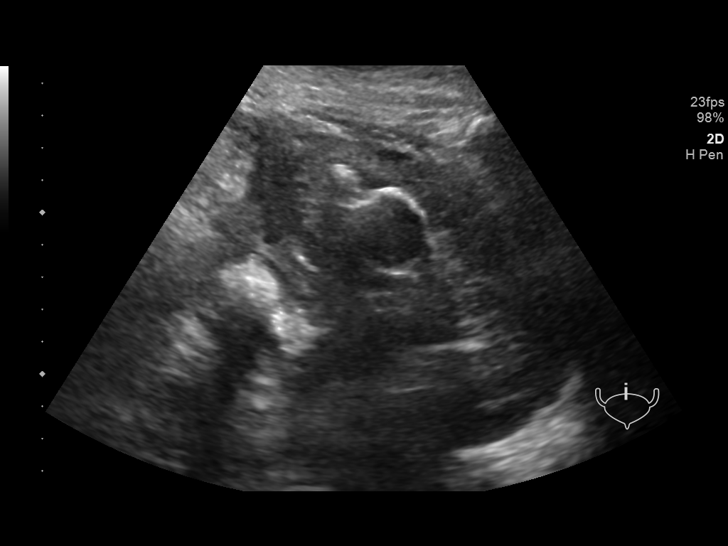
[im 37/41]
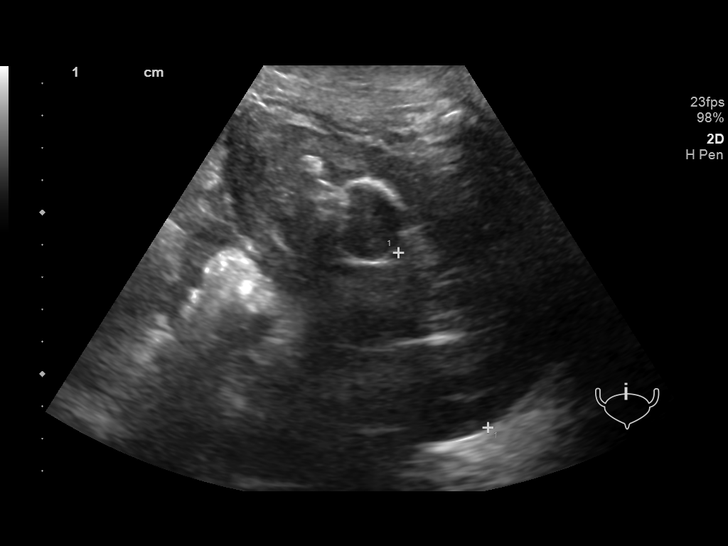
[im 41/41]
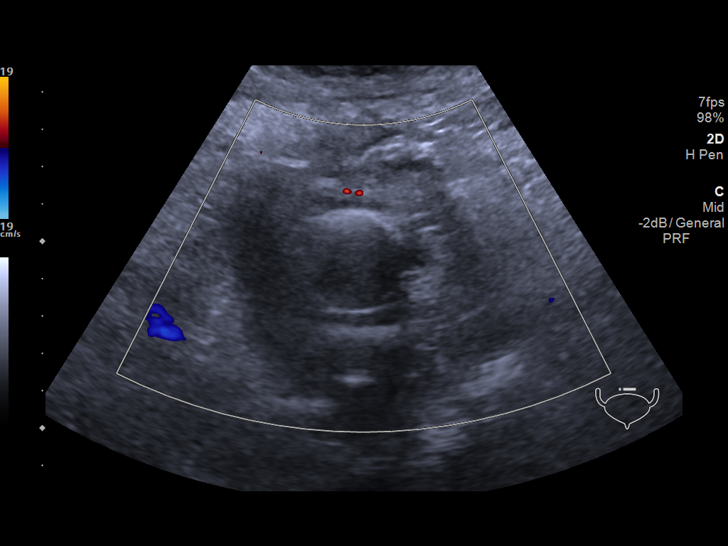

[14 of 25 positions shown; findings below may reference images not displayed]

FINDINGS: Right Kidney:

Length: 10.1 cm. Cortical thinning. Marked hydroureteronephrosis.
Stent in place.

Left Kidney:

Length: 11.6 cm..  Marked hydroureteronephrosis.  Stent in place.

Bladder:

Decompressed with Foley catheter in place. Stent in place. Diffuse
wall thickening.
IMPRESSION: 1. Bilateral ureteral stents are in place. Marked bilateral
hydroureteronephrosis. Right renal cortical thinning.
2. Decompressed urinary bladder with Foley catheter and stents in
place. Diffuse wall thickening. Please see recent CT report.

## 2018-10-27 ENCOUNTER — Encounter: Payer: Self-pay | Admitting: Hematology and Oncology
